# Patient Record
Sex: Female | Born: 1955
Health system: Southern US, Community
[De-identification: ages and names within clinical notes are randomized; demographics above are authoritative.]

## PROBLEM LIST (undated history)

## (undated) DIAGNOSIS — J302 Other seasonal allergic rhinitis: Secondary | ICD-10-CM

## (undated) DIAGNOSIS — K219 Gastro-esophageal reflux disease without esophagitis: Secondary | ICD-10-CM

## (undated) DIAGNOSIS — T7840XA Allergy, unspecified, initial encounter: Secondary | ICD-10-CM

## (undated) DIAGNOSIS — M722 Plantar fascial fibromatosis: Secondary | ICD-10-CM

## (undated) HISTORY — DX: Allergy, unspecified, initial encounter: T78.40XA

## (undated) HISTORY — DX: Other seasonal allergic rhinitis: J30.2

## (undated) HISTORY — DX: Gastro-esophageal reflux disease without esophagitis: K21.9

## (undated) HISTORY — DX: Plantar fascial fibromatosis: M72.2

## (undated) MED FILL — Diphenhydramine HCl Inj 50 MG/ML: INTRAMUSCULAR | Qty: 0.25 | Status: AC

---

## 1990-04-09 HISTORY — PX: TUBAL LIGATION: SHX77

## 1999-07-06 ENCOUNTER — Other Ambulatory Visit: Admission: RE | Admit: 1999-07-06 | Discharge: 1999-07-06 | Payer: Self-pay | Admitting: *Deleted

## 2002-11-04 ENCOUNTER — Other Ambulatory Visit: Admission: RE | Admit: 2002-11-04 | Discharge: 2002-11-04 | Payer: Self-pay | Admitting: *Deleted

## 2007-02-27 ENCOUNTER — Other Ambulatory Visit: Admission: RE | Admit: 2007-02-27 | Discharge: 2007-02-27 | Payer: Self-pay | Admitting: Obstetrics and Gynecology

## 2009-04-09 HISTORY — PX: COLONOSCOPY: SHX174

## 2009-04-09 LAB — HM COLONOSCOPY

## 2009-07-26 ENCOUNTER — Other Ambulatory Visit: Admission: RE | Admit: 2009-07-26 | Discharge: 2009-07-26 | Payer: Self-pay | Admitting: Obstetrics and Gynecology

## 2010-10-03 ENCOUNTER — Other Ambulatory Visit: Payer: Self-pay | Admitting: Obstetrics and Gynecology

## 2012-05-10 LAB — HM MAMMOGRAPHY

## 2012-05-10 LAB — HM PAP SMEAR

## 2013-08-20 ENCOUNTER — Telehealth: Payer: Self-pay

## 2013-08-20 NOTE — Telephone Encounter (Signed)
Medication and allergies:  Reviewed and updated  90 day supply/mail order: n/a Local pharmacy:  [id number] - [place], [place] [address].   Immunizations due:  See below   A/P: Personal, family history and past surgical hx: Reviewed and Updated PAP- 05/2012- normal per patient CCS- 2011- benign polyps; repeat in 5 years (2016) MMG- 05/2012- normal per patient Flu- 01/2013 Tdap- unsure of last vaccine   To Discuss with Provider: Nothing at this time.

## 2013-08-21 ENCOUNTER — Encounter: Payer: Self-pay | Admitting: General Practice

## 2013-08-21 ENCOUNTER — Ambulatory Visit (INDEPENDENT_AMBULATORY_CARE_PROVIDER_SITE_OTHER): Payer: BC Managed Care – PPO | Admitting: Family Medicine

## 2013-08-21 ENCOUNTER — Encounter: Payer: Self-pay | Admitting: Family Medicine

## 2013-08-21 VITALS — BP 107/76 | HR 89 | Temp 98.4°F | Resp 16 | Ht 63.0 in | Wt 162.4 lb

## 2013-08-21 DIAGNOSIS — Z0000 Encounter for general adult medical examination without abnormal findings: Secondary | ICD-10-CM

## 2013-08-21 LAB — CBC WITH DIFFERENTIAL/PLATELET
Basophils Absolute: 0.1 10*3/uL (ref 0.0–0.1)
Basophils Relative: 0.7 % (ref 0.0–3.0)
EOS PCT: 2.2 % (ref 0.0–5.0)
Eosinophils Absolute: 0.2 10*3/uL (ref 0.0–0.7)
HEMATOCRIT: 41.9 % (ref 36.0–46.0)
HEMOGLOBIN: 14.3 g/dL (ref 12.0–15.0)
LYMPHS ABS: 2.3 10*3/uL (ref 0.7–4.0)
Lymphocytes Relative: 27.4 % (ref 12.0–46.0)
MCHC: 34.2 g/dL (ref 30.0–36.0)
MCV: 90.2 fl (ref 78.0–100.0)
MONOS PCT: 7.5 % (ref 3.0–12.0)
Monocytes Absolute: 0.6 10*3/uL (ref 0.1–1.0)
NEUTROS ABS: 5.1 10*3/uL (ref 1.4–7.7)
Neutrophils Relative %: 62.2 % (ref 43.0–77.0)
Platelets: 341 10*3/uL (ref 150.0–400.0)
RBC: 4.65 Mil/uL (ref 3.87–5.11)
RDW: 13 % (ref 11.5–15.5)
WBC: 8.3 10*3/uL (ref 4.0–10.5)

## 2013-08-21 LAB — LIPID PANEL
CHOLESTEROL: 187 mg/dL (ref 0–200)
HDL: 63 mg/dL (ref >39.00)
LDL CALC: 100 mg/dL — AB (ref 0–99)
Total CHOL/HDL Ratio: 3
Triglycerides: 118 mg/dL (ref 0.0–149.0)
VLDL: 23.6 mg/dL (ref 0.0–40.0)

## 2013-08-21 LAB — HEPATIC FUNCTION PANEL
ALBUMIN: 4 g/dL (ref 3.5–5.2)
ALK PHOS: 65 U/L (ref 39–117)
ALT: 26 U/L (ref 0–35)
AST: 13 U/L (ref 0–37)
Bilirubin, Direct: 0 mg/dL (ref 0.0–0.3)
Total Bilirubin: 0.6 mg/dL (ref 0.2–1.2)
Total Protein: 7 g/dL (ref 6.0–8.3)

## 2013-08-21 LAB — BASIC METABOLIC PANEL
BUN: 27 mg/dL — AB (ref 6–23)
CO2: 30 mEq/L (ref 19–32)
CREATININE: 1.2 mg/dL (ref 0.4–1.2)
Calcium: 9.7 mg/dL (ref 8.4–10.5)
Chloride: 102 mEq/L (ref 96–112)
GFR: 48.5 mL/min — ABNORMAL LOW (ref >60.00)
Glucose, Bld: 92 mg/dL (ref 70–99)
POTASSIUM: 3.5 meq/L (ref 3.5–5.1)
Sodium: 139 mEq/L (ref 135–145)

## 2013-08-21 LAB — TSH: TSH: 1.25 u[IU]/mL (ref 0.35–4.50)

## 2013-08-21 MED ORDER — FLUTICASONE PROPIONATE 50 MCG/ACT NA SUSP: 2.0000 | Freq: Every day | NASAL | Status: DC

## 2013-08-21 NOTE — Patient Instructions (Signed)
Follow up in 1 year or as needed We'll notify you of your lab results and make any changes if needed Call with any questions or concerns Keep up the good work!  You look great! Welcome!  We're glad to have you!!

## 2013-08-21 NOTE — Assessment & Plan Note (Signed)
Pt's PE WNL.  UTD on GYN and colonoscopy.  Check labs.  Anticipatory guidance provided.  

## 2013-08-21 NOTE — Progress Notes (Signed)
Pre visit review using our clinic review tool, if applicable. No additional management support is needed unless otherwise documented below in the visit note. 

## 2013-08-21 NOTE — Progress Notes (Signed)
   Subjective:    Patient ID: Nichole Dominguez, female    DOB: [birth year], 57 y.o.   MRN: [id number]  HPI New to establish.  Previous PCP- none.  GYN- Dr Ouida Sills.  UTD on pap, mammo, colonoscopy.  Last DEXA 4-5 yrs ago.   Review of Systems Patient reports no vision/ hearing changes, adenopathy,fever, weight change,  persistant/recurrent hoarseness , swallowing issues, chest pain, palpitations, edema, persistant/recurrent cough, hemoptysis, dyspnea (rest/exertional/paroxysmal nocturnal), gastrointestinal bleeding (melena, rectal bleeding), abdominal pain, significant heartburn, bowel changes, GU symptoms (dysuria, hematuria, incontinence), Gyn symptoms (abnormal  bleeding, pain),  syncope, focal weakness, memory loss, numbness & tingling, skin/hair/nail changes, abnormal bruising or bleeding, anxiety, or depression.     Objective:   Physical Exam General Appearance:    Alert, cooperative, no distress, appears stated age  Head:    Normocephalic, without obvious abnormality, atraumatic  Eyes:    PERRL, conjunctiva/corneas clear, EOM's intact, fundi    benign, both eyes  Ears:    Normal TM's and external ear canals, both ears  Nose:   Nares normal, septum midline, mucosa normal, no drainage    or sinus tenderness  Throat:   Lips, mucosa, and tongue normal; teeth and gums normal  Neck:   Supple, symmetrical, trachea midline, no adenopathy;    Thyroid: no enlargement/tenderness/nodules  Back:     Symmetric, no curvature, ROM normal, no CVA tenderness  Lungs:     Clear to auscultation bilaterally, respirations unlabored  Chest Wall:    No tenderness or deformity   Heart:    Regular rate and rhythm, S1 and S2 normal, no murmur, rub   or gallop  Breast Exam:    Deferred to GYN  Abdomen:     Soft, non-tender, bowel sounds active all four quadrants,    no masses, no organomegaly  Genitalia:    Deferred to GYN  Rectal:    Extremities:   Extremities normal, atraumatic, no cyanosis or edema    Pulses:   2+ and symmetric all extremities  Skin:   Skin color, texture, turgor normal, no rashes or lesions  Lymph nodes:   Cervical, supraclavicular, and axillary nodes normal  Neurologic:   CNII-XII intact, normal strength, sensation and reflexes    throughout         Assessment & Plan:

## 2013-08-25 LAB — VITAMIN D 1,25 DIHYDROXY
Vitamin D 1, 25 (OH)2 Total: 24 pg/mL (ref 18–72)
Vitamin D2 1, 25 (OH)2: <8 pg/mL
Vitamin D3 1, 25 (OH)2: 24 pg/mL

## 2013-08-26 ENCOUNTER — Encounter: Payer: Self-pay | Admitting: General Practice

## 2014-06-22 ENCOUNTER — Other Ambulatory Visit: Payer: Self-pay | Admitting: General Practice

## 2014-06-22 MED ORDER — FLUTICASONE PROPIONATE 50 MCG/ACT NA SUSP: 2.0000 | Freq: Every day | NASAL | Status: DC

## 2014-09-16 ENCOUNTER — Telehealth: Payer: Self-pay | Admitting: Family Medicine

## 2014-09-16 NOTE — Telephone Encounter (Signed)
Pre Visit letter sent  °

## 2014-10-05 ENCOUNTER — Encounter: Payer: Self-pay | Admitting: Behavioral Health

## 2014-10-05 ENCOUNTER — Telehealth: Payer: Self-pay | Admitting: Behavioral Health

## 2014-10-05 NOTE — Telephone Encounter (Signed)
Pre-Visit Call completed with patient and chart updated.   Pre-Visit Info documented in Specialty Comments under [employer].    

## 2014-10-06 ENCOUNTER — Ambulatory Visit (INDEPENDENT_AMBULATORY_CARE_PROVIDER_SITE_OTHER): Payer: BLUE CROSS/BLUE SHIELD | Admitting: Family Medicine

## 2014-10-06 ENCOUNTER — Encounter: Payer: Self-pay | Admitting: Family Medicine

## 2014-10-06 ENCOUNTER — Other Ambulatory Visit (HOSPITAL_COMMUNITY)
Admission: RE | Admit: 2014-10-06 | Discharge: 2014-10-06 | Disposition: A | Discharge status: Home / Self Care | Payer: BLUE CROSS/BLUE SHIELD | Source: Ambulatory Visit | Attending: Family Medicine | Admitting: Family Medicine

## 2014-10-06 VITALS — BP 110/78 | HR 101 | Temp 98.0°F | Resp 16 | Ht 63.0 in | Wt 148.2 lb

## 2014-10-06 DIAGNOSIS — Z1151 Encounter for screening for human papillomavirus (HPV): Secondary | ICD-10-CM | POA: Diagnosis present

## 2014-10-06 DIAGNOSIS — R7989 Other specified abnormal findings of blood chemistry: Secondary | ICD-10-CM | POA: Diagnosis not present

## 2014-10-06 DIAGNOSIS — Z0000 Encounter for general adult medical examination without abnormal findings: Secondary | ICD-10-CM

## 2014-10-06 DIAGNOSIS — Z01419 Encounter for gynecological examination (general) (routine) without abnormal findings: Secondary | ICD-10-CM | POA: Diagnosis present

## 2014-10-06 DIAGNOSIS — Z124 Encounter for screening for malignant neoplasm of cervix: Secondary | ICD-10-CM

## 2014-10-06 DIAGNOSIS — Z780 Asymptomatic menopausal state: Secondary | ICD-10-CM

## 2014-10-06 DIAGNOSIS — Z1231 Encounter for screening mammogram for malignant neoplasm of breast: Secondary | ICD-10-CM | POA: Diagnosis not present

## 2014-10-06 DIAGNOSIS — Z23 Encounter for immunization: Secondary | ICD-10-CM | POA: Diagnosis not present

## 2014-10-06 DIAGNOSIS — E041 Nontoxic single thyroid nodule: Secondary | ICD-10-CM

## 2014-10-06 NOTE — Progress Notes (Signed)
Pre visit review using our clinic review tool, if applicable. No additional management support is needed unless otherwise documented below in the visit note. 

## 2014-10-06 NOTE — Patient Instructions (Signed)
Follow up in 1 year or as needed We'll notify you of your lab results and make any changes if needed We'll call you with your mammo and bone density appts We'll check on the insurance coverage for the ultrasound Keep up the good work on healthy diet and regular exercise Call with any questions or concerns Happy 4th of July!!!

## 2014-10-06 NOTE — Progress Notes (Signed)
   Subjective:    Patient ID: Nichole Dominguez, female    DOB: [birth year], 59 y.o.   MRN: [id number]  HPI CPE- due for mammo, DEXA.  UTD on pap but pt would like to have this done today.  UTD on colonoscopy w/ [facility]   Review of Systems Patient reports no vision/ hearing changes, adenopathy,fever, weight change,  persistant/recurrent hoarseness , swallowing issues, chest pain, palpitations, edema, persistant/recurrent cough, hemoptysis, dyspnea (rest/exertional/paroxysmal nocturnal), gastrointestinal bleeding (melena, rectal bleeding), abdominal pain, significant heartburn, bowel changes, GU symptoms (dysuria, hematuria, incontinence), Gyn symptoms (abnormal  bleeding, pain),  syncope, focal weakness, memory loss, numbness & tingling, skin/hair/nail changes, abnormal bruising or bleeding, anxiety, or depression.     Objective:   Physical Exam  General Appearance:    Alert, cooperative, no distress, appears stated age  Head:    Normocephalic, without obvious abnormality, atraumatic  Eyes:    PERRL, conjunctiva/corneas clear, EOM's intact, fundi    benign, both eyes  Ears:    Normal TM's and external ear canals, both ears  Nose:   Nares normal, septum midline, mucosa normal, no drainage    or sinus tenderness  Throat:   Lips, mucosa, and tongue normal; teeth and gums normal  Neck:   Supple, symmetrical, trachea midline, no adenopathy;    Thyroid: questionable small L thyroid nodule laterally  Back:     Symmetric, no curvature, ROM normal, no CVA tenderness  Lungs:     Clear to auscultation bilaterally, respirations unlabored  Chest Wall:    No tenderness or deformity   Heart:    Regular rate and rhythm, S1 and S2 normal, no murmur, rub   or gallop  Breast Exam:    No tenderness, masses, or nipple abnormality  Abdomen:     Soft, non-tender, bowel sounds active all four quadrants,    no masses, no organomegaly  Genitalia:    External genitalia normal, cervix normal in appearance, no CMT,  uterus in normal size and position, adnexa w/out mass or tenderness, mucosa pink and moist, no lesions or discharge present  Rectal:    Normal external appearance  Extremities:   Extremities normal, atraumatic, no cyanosis or edema  Pulses:   2+ and symmetric all extremities  Skin:   Skin color, texture, turgor normal, no rashes or lesions  Lymph nodes:   Cervical, supraclavicular, and axillary nodes normal  Neurologic:   CNII-XII intact, normal strength, sensation and reflexes    throughout          Assessment & Plan:

## 2014-10-06 NOTE — Assessment & Plan Note (Signed)
I questions whether I feel a small L lateral thyroid nodule on PE today.  Father has had thyroid cancer x2.  Plan is for [place] but pt is concerned that insurance will not pay for diagnostic imaging.  Will order [place] and ask radiology to pre-cert w/ insurance so pt will know out of pocket expenses up front.  Pt expressed understanding and is in agreement w/ plan.

## 2014-10-06 NOTE — Assessment & Plan Note (Signed)
Pap collected. 

## 2014-10-06 NOTE — Assessment & Plan Note (Addendum)
Pt's PE WNL w/ exception of possible small thyroid nodule on L.  Order for mammo and DEXA entered as part of routine well woman preventative care.  UTD on colonoscopy.  Check labs.  Anticipatory guidance provided.

## 2014-10-07 ENCOUNTER — Other Ambulatory Visit: Payer: Self-pay | Admitting: Family Medicine

## 2014-10-07 DIAGNOSIS — Z23 Encounter for immunization: Secondary | ICD-10-CM | POA: Diagnosis not present

## 2014-10-07 DIAGNOSIS — E781 Pure hyperglyceridemia: Secondary | ICD-10-CM

## 2014-10-07 LAB — CBC WITH DIFFERENTIAL/PLATELET
BASOS PCT: 1.1 % (ref 0.0–3.0)
Basophils Absolute: 0.1 10*3/uL (ref 0.0–0.1)
EOS ABS: 0.1 10*3/uL (ref 0.0–0.7)
Eosinophils Relative: 1.3 % (ref 0.0–5.0)
HEMATOCRIT: 43.5 % (ref 36.0–46.0)
Hemoglobin: 14.9 g/dL (ref 12.0–15.0)
Lymphocytes Relative: 24.1 % (ref 12.0–46.0)
Lymphs Abs: 1.9 10*3/uL (ref 0.7–4.0)
MCHC: 34.2 g/dL (ref 30.0–36.0)
MCV: 88.5 fl (ref 78.0–100.0)
MONO ABS: 0.5 10*3/uL (ref 0.1–1.0)
Monocytes Relative: 6.2 % (ref 3.0–12.0)
NEUTROS PCT: 67.3 % (ref 43.0–77.0)
Neutro Abs: 5.2 10*3/uL (ref 1.4–7.7)
Platelets: 343 10*3/uL (ref 150.0–400.0)
RBC: 4.91 Mil/uL (ref 3.87–5.11)
RDW: 12.4 % (ref 11.5–15.5)
WBC: 7.7 10*3/uL (ref 4.0–10.5)

## 2014-10-07 LAB — BASIC METABOLIC PANEL
BUN: 28 mg/dL — ABNORMAL HIGH (ref 6–23)
CALCIUM: 9.4 mg/dL (ref 8.4–10.5)
CHLORIDE: 102 meq/L (ref 96–112)
CO2: 30 mEq/L (ref 19–32)
Creatinine, Ser: 1.1 mg/dL (ref 0.40–1.20)
GFR: 54.43 mL/min — ABNORMAL LOW (ref >60.00)
Glucose, Bld: 67 mg/dL — ABNORMAL LOW (ref 70–99)
Potassium: 3.6 mEq/L (ref 3.5–5.1)
SODIUM: 140 meq/L (ref 135–145)

## 2014-10-07 LAB — HEPATIC FUNCTION PANEL
ALT: 18 U/L (ref 0–35)
AST: 15 U/L (ref 0–37)
Albumin: 4.2 g/dL (ref 3.5–5.2)
Alkaline Phosphatase: 91 U/L (ref 39–117)
Bilirubin, Direct: 0.1 mg/dL (ref 0.0–0.3)
Total Bilirubin: 0.3 mg/dL (ref 0.2–1.2)
Total Protein: 7.2 g/dL (ref 6.0–8.3)

## 2014-10-07 LAB — LIPID PANEL
Cholesterol: 205 mg/dL — ABNORMAL HIGH (ref 0–200)
HDL: 54.8 mg/dL
NonHDL: 150.2
Total CHOL/HDL Ratio: 4
Triglycerides: 365 mg/dL — ABNORMAL HIGH (ref 0.0–149.0)
VLDL: 73 mg/dL — ABNORMAL HIGH (ref 0.0–40.0)

## 2014-10-07 LAB — TSH: TSH: 1.54 u[IU]/mL (ref 0.35–4.50)

## 2014-10-07 LAB — VITAMIN D 25 HYDROXY (VIT D DEFICIENCY, FRACTURES): VITD: 46.85 ng/mL (ref 30.00–100.00)

## 2014-10-07 LAB — LDL CHOLESTEROL, DIRECT: Direct LDL: 111 mg/dL

## 2014-10-07 NOTE — Addendum Note (Signed)
Addended by: Kris Hartmann on: 10/07/2014 08:38 AM   Modules accepted: Orders

## 2014-10-08 LAB — CYTOLOGY - PAP

## 2014-10-26 ENCOUNTER — Ambulatory Visit (HOSPITAL_BASED_OUTPATIENT_CLINIC_OR_DEPARTMENT_OTHER)
Admission: RE | Admit: 2014-10-26 | Discharge: 2014-10-26 | Disposition: A | Discharge status: Home / Self Care | Payer: BLUE CROSS/BLUE SHIELD | Source: Ambulatory Visit | Attending: Family Medicine | Admitting: Family Medicine

## 2014-10-26 DIAGNOSIS — R928 Other abnormal and inconclusive findings on diagnostic imaging of breast: Secondary | ICD-10-CM | POA: Insufficient documentation

## 2014-10-26 DIAGNOSIS — Z1231 Encounter for screening mammogram for malignant neoplasm of breast: Secondary | ICD-10-CM | POA: Insufficient documentation

## 2014-10-29 ENCOUNTER — Other Ambulatory Visit: Payer: Self-pay | Admitting: Family Medicine

## 2014-10-29 DIAGNOSIS — R928 Other abnormal and inconclusive findings on diagnostic imaging of breast: Secondary | ICD-10-CM

## 2014-11-02 ENCOUNTER — Ambulatory Visit (HOSPITAL_BASED_OUTPATIENT_CLINIC_OR_DEPARTMENT_OTHER): Payer: BLUE CROSS/BLUE SHIELD

## 2014-11-04 ENCOUNTER — Other Ambulatory Visit: Payer: BLUE CROSS/BLUE SHIELD

## 2014-11-16 ENCOUNTER — Telehealth: Payer: Self-pay | Admitting: Family Medicine

## 2014-11-16 ENCOUNTER — Ambulatory Visit (HOSPITAL_BASED_OUTPATIENT_CLINIC_OR_DEPARTMENT_OTHER)
Admission: RE | Admit: 2014-11-16 | Discharge: 2014-11-16 | Disposition: A | Discharge status: Home / Self Care | Payer: BLUE CROSS/BLUE SHIELD | Source: Ambulatory Visit | Attending: Family Medicine | Admitting: Family Medicine

## 2014-11-16 ENCOUNTER — Encounter: Payer: Self-pay | Admitting: General Practice

## 2014-11-16 DIAGNOSIS — M85851 Other specified disorders of bone density and structure, right thigh: Secondary | ICD-10-CM | POA: Diagnosis not present

## 2014-11-16 DIAGNOSIS — Z780 Asymptomatic menopausal state: Secondary | ICD-10-CM | POA: Insufficient documentation

## 2014-11-16 DIAGNOSIS — E042 Nontoxic multinodular goiter: Secondary | ICD-10-CM | POA: Insufficient documentation

## 2014-11-16 DIAGNOSIS — E041 Nontoxic single thyroid nodule: Secondary | ICD-10-CM

## 2014-11-16 DIAGNOSIS — M8588 Other specified disorders of bone density and structure, other site: Secondary | ICD-10-CM | POA: Diagnosis not present

## 2014-11-16 NOTE — Telephone Encounter (Signed)
We can try postmenopausal estrogen deficiency

## 2014-11-16 NOTE — Telephone Encounter (Signed)
Can we work on recoding this with the new diagnosis?

## 2014-11-16 NOTE — Telephone Encounter (Signed)
Caller name: Relation to JY:LTEI Call back number:[phone] Pharmacy:  Reason for call: pt states dr. Darene Lamer order a vitamin D test and her insurance did not pay because it states it was a routine test, pt wants to know how dr. t coded it and if there is a way to change the code so that her insurance will pay for it.

## 2014-11-18 ENCOUNTER — Ambulatory Visit
Admission: RE | Admit: 2014-11-18 | Discharge: 2014-11-18 | Disposition: A | Discharge status: Home / Self Care | Payer: BLUE CROSS/BLUE SHIELD | Source: Ambulatory Visit | Attending: Family Medicine | Admitting: Family Medicine

## 2014-11-18 DIAGNOSIS — R928 Other abnormal and inconclusive findings on diagnostic imaging of breast: Secondary | ICD-10-CM

## 2015-01-18 ENCOUNTER — Telehealth: Payer: Self-pay | Admitting: Family Medicine

## 2015-01-18 NOTE — Telephone Encounter (Signed)
Caller name: Nichole Dominguez  Relationship to patient: Self   Can be reached: [phone]  Reason for call: Pt called in because she received her bone density bill. She says that her insurance is stating that they will not pay for it because it was coded incorrectly.   Please advise.      Thanks.

## 2015-02-04 ENCOUNTER — Telehealth: Payer: Self-pay | Admitting: Family Medicine

## 2015-02-04 NOTE — Telephone Encounter (Signed)
Caller name: Daine   Relationship to patient: Self   Can be reached: [phone]  Reason for call: Pt called in stating that her visit on date of service 2014/12/02 (bone density) was coded incorrectly, per her insurance.  She says that her insurance will not cover the code that was sent.    Please advise.

## 2015-02-04 NOTE — Telephone Encounter (Signed)
Will send to office manager for review.  We can likely re-code this w/ their preferred diagnosis and resubmit

## 2015-02-10 NOTE — Telephone Encounter (Signed)
Email sent to Carolann Littler with [facility] billing to see why insurance is denying the claim. Will reach out to patient

## 2015-02-11 NOTE — Telephone Encounter (Signed)
Per Larene Beach they are following up with patients insurance for denial reason, she will follow up with me once she can speak with a rep

## 2015-03-10 NOTE — Telephone Encounter (Signed)
error 

## 2015-03-15 ENCOUNTER — Telehealth: Payer: Self-pay

## 2015-03-15 NOTE — Telephone Encounter (Signed)
err

## 2015-03-16 ENCOUNTER — Other Ambulatory Visit: Payer: Self-pay | Admitting: Family Medicine

## 2015-03-16 DIAGNOSIS — M8580 Other specified disorders of bone density and structure, unspecified site: Secondary | ICD-10-CM

## 2015-03-17 ENCOUNTER — Ambulatory Visit: Payer: BLUE CROSS/BLUE SHIELD | Admitting: Family Medicine

## 2015-03-21 NOTE — Telephone Encounter (Signed)
After talking to [facility] [employer] these charges will stick according to them the coding it correct

## 2015-05-05 ENCOUNTER — Telehealth: Payer: Self-pay

## 2015-05-05 MED ORDER — FLUTICASONE PROPIONATE 50 MCG/ACT NA SUSP: 2.0000 | Freq: Every day | NASAL | Status: DC

## 2015-05-16 NOTE — Telephone Encounter (Signed)
Pt needing flonase sent to [facility]. They stated they have tried to contact us multiple times. Please send in for her asap. Pt states that she is almost out.

## 2015-05-17 ENCOUNTER — Other Ambulatory Visit: Payer: Self-pay

## 2015-05-17 MED ORDER — FLUTICASONE PROPIONATE 50 MCG/ACT NA SUSP: 2.0000 | Freq: Every day | NASAL | Status: DC

## 2015-05-17 NOTE — Telephone Encounter (Addendum)
Flonase Rx sent to [facility] home delivery per patient request.

## 2015-06-23 ENCOUNTER — Telehealth: Payer: Self-pay

## 2015-06-23 NOTE — Telephone Encounter (Signed)
Patient called back checking the status of the Addendum, provider did in February. I called billing on conference call with the patient and was given the fax number [phone] sent to billing to refill received confirmation on fax.

## 2015-07-10 ENCOUNTER — Encounter: Payer: Self-pay | Admitting: Family

## 2015-07-11 MED ORDER — FLUTICASONE PROPIONATE 50 MCG/ACT NA SUSP
2.0000 | Freq: Every day | NASAL | Status: DC
Start: 2015-07-11 — End: 2016-08-14

## 2015-10-07 ENCOUNTER — Encounter: Payer: BLUE CROSS/BLUE SHIELD | Admitting: Family Medicine

## 2015-10-24 ENCOUNTER — Ambulatory Visit (INDEPENDENT_AMBULATORY_CARE_PROVIDER_SITE_OTHER): Payer: BLUE CROSS/BLUE SHIELD | Admitting: Family

## 2015-10-24 ENCOUNTER — Encounter: Payer: Self-pay | Admitting: Family

## 2015-10-24 VITALS — BP 110/80 | HR 74 | Temp 97.6°F | Resp 18 | Ht 61.75 in | Wt 156.4 lb

## 2015-10-24 DIAGNOSIS — Z0000 Encounter for general adult medical examination without abnormal findings: Secondary | ICD-10-CM | POA: Diagnosis not present

## 2015-10-24 NOTE — Progress Notes (Signed)
Pre visit review using our clinic review tool, if applicable. No additional management support is needed unless otherwise documented below in the visit note. 

## 2015-10-24 NOTE — Patient Instructions (Signed)
Try to increase exercise- 30 minutes 5 days a week. Follow up in 1 year. Sooner if problems/concerns.

## 2015-10-24 NOTE — Progress Notes (Signed)
Subjective:    Patient ID: Nichole Dominguez, female    DOB: 05-Jan-1956, 60 y.o.   MRN: [id number]  HPI  [name] is a 60 yr old female who presents today for cpx.  Patient presents today for complete physical.  Immunizations: up to date Diet: generally health Exercise: walks at work, no formal exercise.   Colonoscopy: 5/11- she was told to follow up in 5 years, declines Dexa: 11/16/14 Pap Smear: 10/06/14- negative Mammogram: 11/18/14 Dental: up to date Vision: up to date  Review of Systems  Constitutional: Negative for unexpected weight change.  HENT: Negative for rhinorrhea.   Eyes: Negative for visual disturbance.  Respiratory: Negative for cough.   Cardiovascular: Negative for leg swelling.  Gastrointestinal: Negative for diarrhea and constipation.  Musculoskeletal: Negative for arthralgias.       Reports some leg cramps at night.  Heating pad helps  Skin: Negative for rash.  Neurological: Negative for headaches.  Hematological: Negative for adenopathy.  Psychiatric/Behavioral:       Denies depression/anxiety       Past Medical History  Diagnosis Date  . Seasonal allergies   . Plantar fasciitis      Social History   Social History  . Marital Status: Married    Spouse Name: N/A  . Number of Children: N/A  . Years of Education: N/A   Occupational History  . Not on file.   Social History Main Topics  . Smoking status: Former Smoker    Quit date: 10/10/1996  . Smokeless tobacco: Not on file  . Alcohol Use: 0.0 oz/week    0 Standard drinks or equivalent per week     Comment: 6 oz of red wine per day   . Drug Use: No  . Sexual Activity:    Partners: Male   Other Topics Concern  . Not on file   Social History Narrative    Past Surgical History  Procedure Laterality Date  . Tubal ligation  1992    Family History  Problem Relation Age of Onset  . Diabetes Mother   . Heart disease Mother   . Hypertension Mother   . Cervical cancer Mother   .  Obesity Mother   . Colon cancer Mother   . Thyroid cancer Father   . Cancer Father     thyroid x2  . Breast cancer Sister   . Obesity Maternal Grandmother   . Diabetes Maternal Grandmother   . Heart disease Maternal Grandmother   . Hypertension Maternal Grandmother   . Stroke Maternal Grandmother   . Lupus Sister     Allergies  Allergen Reactions  . Darvon [Propoxyphene] Rash    Current Outpatient Prescriptions on File Prior to Visit  Medication Sig Dispense Refill  . diphenhydrAMINE (BENADRYL) 25 mg capsule Take 25 mg by mouth at bedtime as needed.     . fluticasone (FLONASE) 50 MCG/ACT nasal spray Place 2 sprays into both nostrils daily. (Patient taking differently: Place 2 sprays into both nostrils daily as needed. ) 48 g 1  . loratadine (CLARITIN) 10 MG tablet Take 10 mg by mouth daily.    . Multiple Vitamins-Minerals (MULTIVITAMIN WITH MINERALS) tablet Take 1 tablet by mouth daily.     No current facility-administered medications on file prior to visit.    BP 110/80 mmHg  Pulse 74  Temp(Src) 97.6 F (36.4 C) (Oral)  Resp 18  Ht 5' 1.75" (1.568 m)  Wt 156 lb 6.4 oz (70.943 kg)  BMI 28.85 kg/m2  SpO2 98%    Objective:   Physical Exam  Physical Exam  Constitutional: She is oriented to person, place, and time. She appears well-developed and well-nourished. No distress.  HENT:  Head: Normocephalic and atraumatic.  Right Ear: Tympanic membrane and ear canal normal.  Left Ear: Tympanic membrane and ear canal normal.  Mouth/Throat: Oropharynx is clear and moist.  Eyes: Pupils are equal, round, and reactive to light. No scleral icterus.  Neck: Normal range of motion. No thyromegaly present.  Cardiovascular: Normal rate and regular rhythm.   No murmur heard. Pulmonary/Chest: Effort normal and breath sounds normal. No respiratory distress. He has no wheezes. She has no rales. She exhibits no tenderness.  Abdominal: Soft. Bowel sounds are normal. He exhibits no  distension and no mass. There is no tenderness. There is no rebound and no guarding.  Musculoskeletal: She exhibits no edema.  Lymphadenopathy:    She has no cervical adenopathy.  Neurological: She is alert and oriented to person, place, and time. She has normal patellar reflexes. She exhibits normal muscle tone. Coordination normal.  Skin: Skin is warm and dry. (suntanned) Psychiatric: She has a normal mood and affect. Her behavior is normal. Judgment and thought content normal.  Breasts: Examined lying Right: Without masses, retractions, discharge or axillary adenopathy.  Left: Without masses, retractions, discharge or axillary adenopathy.  Pelvic:  Deferred.        Assessment & Plan:         Assessment & Plan:  Preventative care: immunizations reviewed and up to date.  Declines lab work, colo, ekg, mammogram.  Discussed diet, exercise, weight loss.

## 2015-12-28 ENCOUNTER — Ambulatory Visit (INDEPENDENT_AMBULATORY_CARE_PROVIDER_SITE_OTHER): Payer: BLUE CROSS/BLUE SHIELD

## 2015-12-28 DIAGNOSIS — Z23 Encounter for immunization: Secondary | ICD-10-CM

## 2015-12-28 NOTE — Progress Notes (Signed)
Pre visit review using our clinic review tool, if applicable. No additional management support is needed unless otherwise documented below in the visit note.  Pt in clinic today for flu vaccine.

## 2016-01-04 ENCOUNTER — Encounter: Payer: Self-pay | Admitting: Family

## 2016-01-04 ENCOUNTER — Ambulatory Visit (INDEPENDENT_AMBULATORY_CARE_PROVIDER_SITE_OTHER): Payer: BLUE CROSS/BLUE SHIELD | Admitting: Family

## 2016-01-04 VITALS — BP 129/73 | HR 93 | Temp 98.4°F | Resp 16 | Ht 61.75 in | Wt 156.6 lb

## 2016-01-04 DIAGNOSIS — J069 Acute upper respiratory infection, unspecified: Secondary | ICD-10-CM

## 2016-01-04 NOTE — Progress Notes (Signed)
Subjective:    Patient ID: Nichole Dominguez, female    DOB: Jul 06, 1955, 60 y.o.   MRN: [id number]  HPI  Nichole Dominguez is a 60 yr old female who presents today with chief complaint of cough. Cough began 2 days ago and has associated sinus pressure.  She also reports + HA and rash. Today she reports that cough has resolved and HA yesterday has since resolved.  Rash began Monday after taking sudafed. Reports previous history of rash in the past after taking sudafed. Rash is on extremities and trunk. Rash is non-pruritic.   Review of Systems    see HPI  Past Medical History:  Diagnosis Date  . Plantar fasciitis   . Seasonal allergies      Social History   Social History  . Marital status: Married    Spouse name: N/A  . Number of children: N/A  . Years of education: N/A   Occupational History  . Not on file.   Social History Main Topics  . Smoking status: Former Smoker    Quit date: 10/10/1996  . Smokeless tobacco: Not on file  . Alcohol use 0.0 oz/week     Comment: 6 oz of red wine per day   . Drug use: No  . Sexual activity: Yes    Partners: Male   Other Topics Concern  . Not on file   Social History Narrative   Works at [employer] in the [employer].    Lives with husband   2 grown children- one in [place] and one in [place], grandchildren   2 grown stepchildren- both local, grandchildren   Enjoys canning/gardening       Past Surgical History:  Procedure Laterality Date  . TUBAL LIGATION  1992    Family History  Problem Relation Age of Onset  . Diabetes Mother   . Heart disease Mother   . Hypertension Mother   . Cervical cancer Mother   . Obesity Mother   . Colon cancer Mother   . Thyroid cancer Father   . Cancer Father     thyroid x2  . Breast cancer Sister   . Obesity Maternal Grandmother   . Diabetes Maternal Grandmother   . Heart disease Maternal Grandmother   . Hypertension Maternal Grandmother   . Stroke Maternal Grandmother   . Lupus  Sister     Allergies  Allergen Reactions  . Darvon [Propoxyphene] Rash    Current Outpatient Prescriptions on File Prior to Visit  Medication Sig Dispense Refill  . diphenhydrAMINE (BENADRYL) 25 mg capsule Take 25 mg by mouth at bedtime as needed.     . fluticasone (FLONASE) 50 MCG/ACT nasal spray Place 2 sprays into both nostrils daily. (Patient taking differently: Place 2 sprays into both nostrils daily as needed. ) 48 g 1  . loratadine (CLARITIN) 10 MG tablet Take 10 mg by mouth daily.    . Multiple Vitamins-Minerals (MULTIVITAMIN WITH MINERALS) tablet Take 1 tablet by mouth daily.    . Probiotic Product (PROBIOTIC ADVANCED PO) Take 1 capsule by mouth daily.     No current facility-administered medications on file prior to visit.     BP 129/73 (BP Location: Right Arm, Cuff Size: Normal)   Pulse 93   Temp 98.4 F (36.9 C) (Oral)   Resp 16   Ht 5' 1.75" (1.568 m)   Wt 156 lb 9.6 oz (71 kg)   SpO2 98% Comment: room air  BMI 28.87 kg/m    Objective:  Physical Exam  Constitutional: She is oriented to person, place, and time. She appears well-developed and well-nourished.  HENT:  Head: Normocephalic and atraumatic.  Right Ear: Tympanic membrane and ear canal normal.  Left Ear: Tympanic membrane and ear canal normal.  Mouth/Throat: No oropharyngeal exudate, posterior oropharyngeal edema or posterior oropharyngeal erythema.  Cardiovascular: Normal rate, regular rhythm and normal heart sounds.   No murmur heard. Pulmonary/Chest: Effort normal and breath sounds normal. No respiratory distress. She has no wheezes.  Musculoskeletal: She exhibits no edema.  Neurological: She is alert and oriented to person, place, and time.  Psychiatric: She has a normal mood and affect. Her behavior is normal. Judgment and thought content normal.          Assessment & Plan:  Viral URI- Symptoms most consistent with viral URI.  Rash may be secondary to sudafed allergy or viral illness.  Advised pt to avoid sudafed. Advised patient as follows. You may use claritin or nasal saline spray as needed for nasal congestion. For cough you may use delsym. Call if new/worsening symptoms, fever >101 or if you are not improved in 3 days.

## 2016-01-04 NOTE — Progress Notes (Signed)
Pre visit review using our clinic review tool, if applicable. No additional management support is needed unless otherwise documented below in the visit note. 

## 2016-01-04 NOTE — Patient Instructions (Signed)
You may use claritin or nasal saline spray as needed for nasal congestion. For cough you may use delsym. Call if new/worsening symptoms, fever >101 or if you are not improved in 3 days.

## 2016-08-14 ENCOUNTER — Other Ambulatory Visit: Payer: Self-pay | Admitting: Family Medicine

## 2016-08-15 ENCOUNTER — Telehealth: Payer: Self-pay | Admitting: Family

## 2016-08-15 NOTE — Telephone Encounter (Signed)
complete

## 2016-10-29 ENCOUNTER — Encounter: Payer: BLUE CROSS/BLUE SHIELD | Admitting: Family

## 2016-11-05 ENCOUNTER — Encounter: Payer: BLUE CROSS/BLUE SHIELD | Admitting: Family

## 2016-11-19 ENCOUNTER — Ambulatory Visit (INDEPENDENT_AMBULATORY_CARE_PROVIDER_SITE_OTHER): Payer: BLUE CROSS/BLUE SHIELD | Admitting: Family

## 2016-11-19 ENCOUNTER — Encounter: Payer: Self-pay | Admitting: Family

## 2016-11-19 VITALS — BP 130/78 | HR 82 | Temp 98.3°F | Resp 18 | Ht 62.0 in | Wt 162.6 lb

## 2016-11-19 DIAGNOSIS — Z0000 Encounter for general adult medical examination without abnormal findings: Secondary | ICD-10-CM

## 2016-11-19 NOTE — Patient Instructions (Addendum)
Please complete lab work prior to leaving. Continue to work on healthy diet, exercise and weight loss.  

## 2016-11-19 NOTE — Progress Notes (Signed)
Subjective:    Patient ID: Nichole Dominguez, female    DOB: [birth year], 61 y.o.   MRN: [id number]  HPI  Patient presents today for complete physical.  Immunizations: tetanus up to date.  Diet:  healthy Exercise:  Needs more exercise Wt Readings from Last 3 Encounters:  11/19/16 162 lb 9.6 oz (73.8 kg)  01/04/16 156 lb 9.6 oz (71 kg)  10/24/15 156 lb 6.4 oz (70.9 kg)  Colonoscopy:  2011, was told 5 year follow up. Declines until 2021.  Dexa: 8/16 Pap Smear: 6/16- normal Mammogram: 8/16- due pt will schedule.      Review of Systems  Constitutional: Negative for unexpected weight change.  HENT: Negative for hearing loss and rhinorrhea.   Respiratory: Negative for cough.   Cardiovascular: Negative for leg swelling.  Gastrointestinal: Negative for blood in stool, constipation and diarrhea.  Genitourinary: Negative for hematuria.  Musculoskeletal: Negative for arthralgias and myalgias.  Skin: Negative for rash.  Neurological: Negative for headaches.  Hematological: Negative for adenopathy.  Psychiatric/Behavioral:       Denies depression/anxiety       Past Medical History:  Diagnosis Date  . Plantar fasciitis   . Seasonal allergies      Social History   Social History  . Marital status: Married    Spouse name: N/A  . Number of children: N/A  . Years of education: N/A   Occupational History  . Not on file.   Social History Main Topics  . Smoking status: Former Smoker    Quit date: 10/10/1996  . Smokeless tobacco: Never Used  . Alcohol use 0.0 oz/week     Comment: 6 oz of red wine per day   . Drug use: No  . Sexual activity: Yes    Partners: Male   Other Topics Concern  . Not on file   Social History Narrative   Works at [employer] in the [employer].    Lives with husband   2 grown children- one in [place] and one in [place], grandchildren   2 grown stepchildren- both local, grandchildren   Enjoys canning/gardening       Past Surgical History:    Procedure Laterality Date  . TUBAL LIGATION  1992    Family History  Problem Relation Age of Onset  . Diabetes Mother   . Heart disease Mother   . Hypertension Mother   . Cervical cancer Mother   . Obesity Mother   . Colon cancer Mother   . Thyroid cancer Father   . Cancer Father        thyroid x2  . Breast cancer Sister   . Obesity Maternal Grandmother   . Diabetes Maternal Grandmother   . Heart disease Maternal Grandmother   . Hypertension Maternal Grandmother   . Stroke Maternal Grandmother   . Lupus Sister     Allergies  Allergen Reactions  . Darvon [Propoxyphene] Rash  . Sudafed [Pseudoephedrine Hcl]     rash    Current Outpatient Prescriptions on File Prior to Visit  Medication Sig Dispense Refill  . diphenhydrAMINE (BENADRYL) 25 mg capsule Take 25 mg by mouth at bedtime as needed.     . fluticasone (FLONASE) 50 MCG/ACT nasal spray USE 2 SPRAYS IN EACH NOSTRIL DAILY 48 g 1  . loratadine (CLARITIN) 10 MG tablet Take 10 mg by mouth daily.    . Multiple Vitamins-Minerals (MULTIVITAMIN WITH MINERALS) tablet Take 1 tablet by mouth daily.     No current facility-administered medications  on file prior to visit.     BP 130/78 (BP Location: Left Arm, Cuff Size: Normal)   Pulse 82   Temp 98.3 F (36.8 C) (Oral)   Resp 18   Ht 5\' 2"  (1.575 m)   Wt 162 lb 9.6 oz (73.8 kg)   SpO2 96%   BMI 29.74 kg/m    Objective:   Physical Exam Physical Exam  Constitutional: She is oriented to person, place, and time. She appears well-developed and well-nourished. No distress.  HENT:  Head: Normocephalic and atraumatic.  Right Ear: Tympanic membrane and ear canal normal.  Left Ear: Tympanic membrane and ear canal normal.  Mouth/Throat: Oropharynx is clear and moist.  Eyes: Pupils are equal, round, and reactive to light. No scleral icterus.  Neck: Normal range of motion. No thyromegaly present.  Cardiovascular: Normal rate and regular rhythm.   No murmur  heard. Pulmonary/Chest: Effort normal and breath sounds normal. No respiratory distress. He has no wheezes. She has no rales. She exhibits no tenderness.  Abdominal: Soft. Bowel sounds are normal. She exhibits no distension and no mass. There is no tenderness. There is no rebound and no guarding.  Musculoskeletal: She exhibits no edema.  Lymphadenopathy:    She has no cervical adenopathy.  Neurological: She is alert and oriented to person, place, and time. She has normal patellar reflexes. She exhibits normal muscle tone. Coordination normal.  Skin: Skin is warm and dry.  Psychiatric: She has a normal mood and affect. Her behavior is normal. Judgment and thought content normal.  Breasts: Examined lying Right: Without masses, retractions, discharge or axillary adenopathy.  Left: Without masses, retractions, discharge or axillary adenopathy.  Pelvic: deferred.           Assessment & Plan:          Assessment & Plan:  Preventative Care- discussed healthy diet, exercise, weight loss. She will schedule mammo, declines colo. Obtain routine lab work.

## 2016-11-20 LAB — BASIC METABOLIC PANEL
BUN: 25 mg/dL — ABNORMAL HIGH (ref 6–23)
CO2: 29 mEq/L (ref 19–32)
Calcium: 10.8 mg/dL — ABNORMAL HIGH (ref 8.4–10.5)
Chloride: 102 mEq/L (ref 96–112)
Creatinine, Ser: 1.12 mg/dL (ref 0.40–1.20)
GFR: 52.92 mL/min — AB (ref >60.00)
Glucose, Bld: 98 mg/dL (ref 70–99)
Potassium: 4.5 mEq/L (ref 3.5–5.1)
Sodium: 140 mEq/L (ref 135–145)

## 2016-11-20 LAB — CBC WITH DIFFERENTIAL/PLATELET
Basophils Absolute: 0.1 10*3/uL (ref 0.0–0.1)
Basophils Relative: 1.3 % (ref 0.0–3.0)
Eosinophils Absolute: 0.2 10*3/uL (ref 0.0–0.7)
Eosinophils Relative: 2 % (ref 0.0–5.0)
HCT: 44.9 % (ref 36.0–46.0)
HEMOGLOBIN: 14.9 g/dL (ref 12.0–15.0)
Lymphocytes Relative: 31.4 % (ref 12.0–46.0)
Lymphs Abs: 2.6 10*3/uL (ref 0.7–4.0)
MCHC: 33.2 g/dL (ref 30.0–36.0)
MCV: 90 fl (ref 78.0–100.0)
Monocytes Absolute: 0.7 10*3/uL (ref 0.1–1.0)
Monocytes Relative: 8.5 % (ref 3.0–12.0)
NEUTROS PCT: 56.8 % (ref 43.0–77.0)
Neutro Abs: 4.6 10*3/uL (ref 1.4–7.7)
Platelets: 340 10*3/uL (ref 150.0–400.0)
RBC: 4.98 Mil/uL (ref 3.87–5.11)
RDW: 13.3 % (ref 11.5–15.5)
WBC: 8.1 10*3/uL (ref 4.0–10.5)

## 2016-11-20 LAB — TSH: TSH: 3.58 u[IU]/mL (ref 0.35–4.50)

## 2016-11-20 LAB — URINALYSIS, ROUTINE W REFLEX MICROSCOPIC
Bilirubin Urine: NEGATIVE
HGB URINE DIPSTICK: NEGATIVE
KETONES UR: NEGATIVE
Leukocytes, UA: NEGATIVE
Nitrite: NEGATIVE
PH: 7 (ref 5.0–8.0)
RBC / HPF: NONE SEEN (ref 0–?)
Specific Gravity, Urine: 1.01 (ref 1.000–1.030)
Total Protein, Urine: NEGATIVE
Urine Glucose: NEGATIVE
Urobilinogen, UA: 0.2 (ref 0.0–1.0)
WBC UA: NONE SEEN (ref 0–?)

## 2016-11-20 LAB — HEPATIC FUNCTION PANEL
ALT: 32 U/L (ref 0–35)
AST: 12 U/L (ref 0–37)
Albumin: 4.4 g/dL (ref 3.5–5.2)
Alkaline Phosphatase: 74 U/L (ref 39–117)
BILIRUBIN TOTAL: 0.5 mg/dL (ref 0.2–1.2)
Bilirubin, Direct: 0.1 mg/dL (ref 0.0–0.3)
Total Protein: 6.7 g/dL (ref 6.0–8.3)

## 2016-11-20 LAB — LIPID PANEL
CHOL/HDL RATIO: 3
CHOLESTEROL: 196 mg/dL (ref 0–200)
HDL: 56.7 mg/dL (ref >39.00)
NONHDL: 139.67
TRIGLYCERIDES: 231 mg/dL — AB (ref 0.0–149.0)
VLDL: 46.2 mg/dL — AB (ref 0.0–40.0)

## 2016-11-20 LAB — LDL CHOLESTEROL, DIRECT: LDL DIRECT: 118 mg/dL

## 2016-12-03 ENCOUNTER — Telehealth: Payer: Self-pay | Admitting: *Deleted

## 2016-12-03 NOTE — Telephone Encounter (Signed)
-----   Message from [name], NP sent at 11/21/2016  7:58 AM EDT ----- Please let patient know that her kidney function is stable. Her calcium was elevated at this time. She taking calcium supplement? If so she should discontinue.  I would like for her to return to the lab in 2 weeks for a PTH with calcium. Diagnosis hypercalcemia.

## 2016-12-03 NOTE — Telephone Encounter (Signed)
Nichole Dominguez-- can you contact [facility] and find out cost of PTH w/calcium?  Notified pt and she voices understanding. Pt would like to know cost of lab test prior to scheduling lab appt. Pt also requests that we send response to her via mychart.

## 2016-12-04 NOTE — Telephone Encounter (Signed)
Nichole Dominguez sorry for the delay. If the patient has the test done here at our office she will get a bill for $163.00, if she has insurance she can bill them to see if they cover anything. If she goes to a [facility] center and pays up front at time of service it will be $94 + a $10 venipuncture for a total of $104.

## 2016-12-04 NOTE — Telephone Encounter (Signed)
Mychart message sent to pt. Awaiting pt reply.

## 2017-11-25 ENCOUNTER — Encounter: Payer: BLUE CROSS/BLUE SHIELD | Admitting: Family

## 2017-12-02 ENCOUNTER — Ambulatory Visit (INDEPENDENT_AMBULATORY_CARE_PROVIDER_SITE_OTHER): Payer: BLUE CROSS/BLUE SHIELD | Admitting: Family

## 2017-12-02 ENCOUNTER — Encounter: Payer: Self-pay | Admitting: Family

## 2017-12-02 VITALS — BP 126/72 | HR 70 | Temp 97.9°F | Resp 18 | Ht 62.0 in | Wt 148.0 lb

## 2017-12-02 DIAGNOSIS — Z0000 Encounter for general adult medical examination without abnormal findings: Secondary | ICD-10-CM | POA: Diagnosis not present

## 2017-12-02 DIAGNOSIS — Z23 Encounter for immunization: Secondary | ICD-10-CM | POA: Diagnosis not present

## 2017-12-02 DIAGNOSIS — Z800 Family history of malignant neoplasm of digestive organs: Secondary | ICD-10-CM | POA: Diagnosis not present

## 2017-12-02 LAB — URINALYSIS, ROUTINE W REFLEX MICROSCOPIC
Bilirubin Urine: NEGATIVE
HGB URINE DIPSTICK: NEGATIVE
Ketones, ur: NEGATIVE
Leukocytes, UA: NEGATIVE
Nitrite: NEGATIVE
RBC / HPF: NONE SEEN (ref 0–?)
Specific Gravity, Urine: 1.015 (ref 1.000–1.030)
Total Protein, Urine: NEGATIVE
URINE GLUCOSE: NEGATIVE
UROBILINOGEN UA: 0.2 (ref 0.0–1.0)
pH: 7 (ref 5.0–8.0)

## 2017-12-02 LAB — LIPID PANEL
Cholesterol: 223 mg/dL — ABNORMAL HIGH (ref 0–200)
HDL: 56.9 mg/dL (ref >39.00)
LDL Cholesterol: 130 mg/dL — ABNORMAL HIGH (ref 0–99)
NonHDL: 166.37
TRIGLYCERIDES: 183 mg/dL — AB (ref 0.0–149.0)
Total CHOL/HDL Ratio: 4
VLDL: 36.6 mg/dL (ref 0.0–40.0)

## 2017-12-02 LAB — CBC WITH DIFFERENTIAL/PLATELET
BASOS PCT: 1.3 % (ref 0.0–3.0)
Basophils Absolute: 0.1 10*3/uL (ref 0.0–0.1)
EOS ABS: 0.1 10*3/uL (ref 0.0–0.7)
Eosinophils Relative: 1.1 % (ref 0.0–5.0)
HCT: 42.9 % (ref 36.0–46.0)
Hemoglobin: 14.7 g/dL (ref 12.0–15.0)
Lymphocytes Relative: 34 % (ref 12.0–46.0)
Lymphs Abs: 1.8 10*3/uL (ref 0.7–4.0)
MCHC: 34.2 g/dL (ref 30.0–36.0)
MCV: 87.5 fl (ref 78.0–100.0)
MONO ABS: 0.5 10*3/uL (ref 0.1–1.0)
Monocytes Relative: 8.8 % (ref 3.0–12.0)
Neutro Abs: 2.9 10*3/uL (ref 1.4–7.7)
Neutrophils Relative %: 54.8 % (ref 43.0–77.0)
Platelets: 356 10*3/uL (ref 150.0–400.0)
RBC: 4.9 Mil/uL (ref 3.87–5.11)
RDW: 12.7 % (ref 11.5–15.5)
WBC: 5.3 10*3/uL (ref 4.0–10.5)

## 2017-12-02 LAB — BASIC METABOLIC PANEL
BUN: 24 mg/dL — ABNORMAL HIGH (ref 6–23)
CALCIUM: 10.1 mg/dL (ref 8.4–10.5)
CHLORIDE: 100 meq/L (ref 96–112)
CO2: 31 mEq/L (ref 19–32)
CREATININE: 0.91 mg/dL (ref 0.40–1.20)
GFR: 67.01 mL/min (ref >60.00)
Glucose, Bld: 103 mg/dL — ABNORMAL HIGH (ref 70–99)
Potassium: 4.9 mEq/L (ref 3.5–5.1)
Sodium: 137 mEq/L (ref 135–145)

## 2017-12-02 LAB — HEPATIC FUNCTION PANEL
ALT: 21 U/L (ref 0–35)
AST: 10 U/L (ref 0–37)
Albumin: 4.3 g/dL (ref 3.5–5.2)
Alkaline Phosphatase: 95 U/L (ref 39–117)
BILIRUBIN TOTAL: 0.5 mg/dL (ref 0.2–1.2)
Bilirubin, Direct: 0.1 mg/dL (ref 0.0–0.3)
Total Protein: 7.1 g/dL (ref 6.0–8.3)

## 2017-12-02 LAB — TSH: TSH: 3.21 u[IU]/mL (ref 0.35–4.50)

## 2017-12-02 MED ORDER — FLUTICASONE PROPIONATE 50 MCG/ACT NA SUSP: 2.0000 | Freq: Every day | NASAL | 3 refills | Status: DC

## 2017-12-02 MED ORDER — OMEPRAZOLE 20 MG PO CPDR: 20.0000 mg | DELAYED_RELEASE_CAPSULE | ORAL | 1 refills | Status: DC

## 2017-12-02 NOTE — Addendum Note (Signed)
Addended by: Kelle Darting A on: 12/02/2017 12:15 PM   Modules accepted: Orders

## 2017-12-02 NOTE — Progress Notes (Signed)
Subjective:    Patient ID: Nichole Dominguez, female    DOB: 05/29/1955, 62 y.o.   MRN: [id number]  HPI  Nichole Dominguez is a 62 yr old female who presents today for cpx.  Immunizations: tdap 2016 Diet: healthy, doing weight watchers Wt Readings from Last 3 Encounters:  12/02/17 148 lb (67.1 kg)  11/19/16 162 lb 9.6 oz (73.8 kg)  01/04/16 156 lb 9.6 oz (71 kg)  Exercise: walks at work Colonoscopy: 5/11,   Dexa: 11/16/14, declines due to cost Pap Smear: -2016, declines Mammogram: 8/16 Dental: up to date Vision: up to date    Review of Systems  Constitutional: Negative for unexpected weight change.  HENT: Negative for hearing loss and rhinorrhea.   Eyes: Negative for visual disturbance.  Respiratory: Negative for cough and shortness of breath.   Cardiovascular: Negative for chest pain and leg swelling.  Gastrointestinal: Negative for blood in stool, constipation and diarrhea.  Genitourinary: Negative for dysuria, frequency and hematuria.  Musculoskeletal: Negative for arthralgias and myalgias.  Skin: Negative for rash.  Neurological: Negative for headaches.  Hematological: Negative for adenopathy.  Psychiatric/Behavioral:       Denies depression/anxiety   Past Medical History:  Diagnosis Date  . Plantar fasciitis   . Seasonal allergies      Social History   Socioeconomic History  . Marital status: Married    Spouse name: Not on file  . Number of children: Not on file  . Years of education: Not on file  . Highest education level: Not on file  Occupational History  . Not on file  Social Needs  . Financial resource strain: Not on file  . Food insecurity:    Worry: Not on file    Inability: Not on file  . Transportation needs:    Medical: Not on file    Non-medical: Not on file  Tobacco Use  . Smoking status: Former Smoker    Last attempt to quit: 10/10/1996    Years since quitting: 21.1  . Smokeless tobacco: Never Used  Substance and Sexual Activity  . Alcohol  use: Yes    Alcohol/week: 0.0 standard drinks    Comment: 1 shot vodka daily  . Drug use: No  . Sexual activity: Yes    Partners: Male  Lifestyle  . Physical activity:    Days per week: Not on file    Minutes per session: Not on file  . Stress: Not on file  Relationships  . Social connections:    Talks on phone: Not on file    Gets together: Not on file    Attends religious service: Not on file    Active member of club or organization: Not on file    Attends meetings of clubs or organizations: Not on file    Relationship status: Not on file  . Intimate partner violence:    Fear of current or ex partner: Not on file    Emotionally abused: Not on file    Physically abused: Not on file    Forced sexual activity: Not on file  Other Topics Concern  . Not on file  Social History Narrative   Works at [employer] in the [employer].    Lives with husband   2 grown children- one in [place] and one in [place], grandchildren   2 grown stepchildren- both local, grandchildren   Enjoys canning/gardening    Past Surgical History:  Procedure Laterality Date  . [place]  Family History  Problem Relation Age of Onset  . Diabetes Mother   . Heart disease Mother        chf  . Hypertension Mother   . Cervical cancer Mother   . Obesity Mother   . Colon cancer Mother        in her 31's  . Thyroid cancer Father   . Cancer Father        thyroid x2  . Breast cancer Sister   . Lupus Sister   . Obesity Maternal Grandmother   . Diabetes Maternal Grandmother   . Heart disease Maternal Grandmother   . Hypertension Maternal Grandmother   . Stroke Maternal Grandmother     Allergies  Allergen Reactions  . Darvon [Propoxyphene] Rash  . Sudafed [Pseudoephedrine Hcl]     rash    Current Outpatient Medications on File Prior to Visit  Medication Sig Dispense Refill  . Calcium Carbonate-Vit D-Min (CALCIUM 1200) 1200-1000 MG-UNIT CHEW Chew 1 tablet by mouth at bedtime.     . diphenhydrAMINE (BENADRYL) 25 mg capsule Take 25 mg by mouth at bedtime as needed.     . fluticasone (FLONASE) 50 MCG/ACT nasal spray USE 2 SPRAYS IN EACH NOSTRIL DAILY 48 g 1  . loratadine (CLARITIN) 10 MG tablet Take 10 mg by mouth daily.    . Multiple Vitamins-Minerals (MULTIVITAMIN WITH MINERALS) tablet Take 1 tablet by mouth daily.    Marland Kitchen omeprazole (PRILOSEC) 20 MG capsule Take 20 mg by mouth every morning.     No current facility-administered medications on file prior to visit.     BP 126/72 (BP Location: Left Arm, Cuff Size: Normal)   Pulse 70   Temp 97.9 F (36.6 C) (Oral)   Resp 18   Ht 5\' 2"  (1.575 m)   Wt 148 lb (67.1 kg)   SpO2 99%   BMI 27.07 kg/m       Objective:   Physical Exam  Physical Exam  Constitutional: She is oriented to person, place, and time. She appears well-developed and well-nourished. No distress.  HENT:  Head: Normocephalic and atraumatic.  Right Ear: Tympanic membrane and ear canal normal.  Left Ear: Tympanic membrane and ear canal normal.  Mouth/Throat: Oropharynx is clear and moist.  Eyes: Pupils are equal, round, and reactive to light. No scleral icterus.  Neck: Normal range of motion. No thyromegaly present.  Cardiovascular: Normal rate and regular rhythm.   No murmur heard. Pulmonary/Chest: Effort normal and breath sounds normal. No respiratory distress. He has no wheezes. She has no rales. She exhibits no tenderness.  Abdominal: Soft. Bowel sounds are normal. She exhibits no distension and no mass. There is no tenderness. There is no rebound and no guarding.  Musculoskeletal: She exhibits no edema.  Lymphadenopathy:    She has no cervical adenopathy.  Neurological: She is alert and oriented to person, place, and time. She has normal patellar reflexes. She exhibits normal muscle tone. Coordination normal.  Skin: Skin is warm and dry.  Psychiatric: She has a normal mood and affect. Her behavior is normal. Judgment and thought content  normal.  Breasts: Examined lying Right: Without masses, retractions, discharge or axillary adenopathy.  Left: Without masses, retractions, discharge or axillary adenopathy.        Assessment & Plan:   Preventative care- encouraged patient to continue healthy diet, exercise and weight loss efforts. Due to family hx of colon cancer in mother recommended that she have colo repeated now rather than waiting 10 years.  She is agreeable to follow up colo. Will request results of previous colo as well from [facility]. Declines pap/dexa/ekg.  Agreeable to Shingrix. Shingrix #1 today.   Allergic rhinitis- stable on flonase/claritin- continue same.  GERD-stable on PPI. Continue same.      Assessment & Plan:

## 2017-12-02 NOTE — Patient Instructions (Signed)
Continue healthy diet and regular exercise. You should be contacted about scheduling your colonoscopy and mammogram.

## 2017-12-04 ENCOUNTER — Encounter: Payer: BLUE CROSS/BLUE SHIELD | Admitting: Family

## 2018-01-06 ENCOUNTER — Ambulatory Visit (INDEPENDENT_AMBULATORY_CARE_PROVIDER_SITE_OTHER): Payer: BLUE CROSS/BLUE SHIELD

## 2018-01-06 DIAGNOSIS — Z23 Encounter for immunization: Secondary | ICD-10-CM | POA: Diagnosis not present

## 2018-01-06 MED ORDER — CYANOCOBALAMIN 1000 MCG/ML IJ SOLN: 1000.0000 ug | Freq: Once | INTRAMUSCULAR | Status: DC

## 2018-01-31 ENCOUNTER — Encounter: Payer: Self-pay | Admitting: Family

## 2018-02-04 ENCOUNTER — Ambulatory Visit (INDEPENDENT_AMBULATORY_CARE_PROVIDER_SITE_OTHER): Payer: BLUE CROSS/BLUE SHIELD

## 2018-02-04 DIAGNOSIS — Z23 Encounter for immunization: Secondary | ICD-10-CM

## 2018-06-04 DIAGNOSIS — K045 Chronic apical periodontitis: Secondary | ICD-10-CM | POA: Diagnosis not present

## 2018-06-18 DIAGNOSIS — H40003 Preglaucoma, unspecified, bilateral: Secondary | ICD-10-CM | POA: Diagnosis not present

## 2018-06-25 ENCOUNTER — Other Ambulatory Visit: Payer: Self-pay | Admitting: Family

## 2018-11-13 ENCOUNTER — Telehealth: Payer: Self-pay | Admitting: Family

## 2018-11-13 NOTE — Telephone Encounter (Signed)
Called patient 2 times for cpe appt. No ans no option to leave  Message

## 2018-11-14 NOTE — Telephone Encounter (Signed)
cpe scheduled

## 2018-12-04 ENCOUNTER — Other Ambulatory Visit: Payer: Self-pay | Admitting: Family

## 2018-12-16 ENCOUNTER — Encounter: Payer: BLUE CROSS/BLUE SHIELD | Admitting: Family

## 2018-12-23 ENCOUNTER — Other Ambulatory Visit: Payer: Self-pay

## 2018-12-24 ENCOUNTER — Other Ambulatory Visit (HOSPITAL_COMMUNITY)
Admission: RE | Admit: 2018-12-24 | Discharge: 2018-12-24 | Disposition: A | Discharge status: Home / Self Care | Payer: BC Managed Care – PPO | Source: Ambulatory Visit | Attending: Family | Admitting: Family

## 2018-12-24 ENCOUNTER — Encounter: Payer: Self-pay | Admitting: Family

## 2018-12-24 ENCOUNTER — Ambulatory Visit (INDEPENDENT_AMBULATORY_CARE_PROVIDER_SITE_OTHER): Payer: BC Managed Care – PPO | Admitting: Family

## 2018-12-24 VITALS — BP 144/72 | HR 75 | Temp 96.8°F | Resp 16 | Ht 61.0 in | Wt 166.0 lb

## 2018-12-24 DIAGNOSIS — Z01419 Encounter for gynecological examination (general) (routine) without abnormal findings: Secondary | ICD-10-CM | POA: Insufficient documentation

## 2018-12-24 DIAGNOSIS — Z0000 Encounter for general adult medical examination without abnormal findings: Secondary | ICD-10-CM

## 2018-12-24 DIAGNOSIS — Z23 Encounter for immunization: Secondary | ICD-10-CM | POA: Diagnosis not present

## 2018-12-24 LAB — LIPID PANEL
Cholesterol: 240 mg/dL — ABNORMAL HIGH (ref 0–200)
HDL: 56.8 mg/dL (ref >39.00)
LDL Cholesterol: 151 mg/dL — ABNORMAL HIGH (ref 0–99)
NonHDL: 183.21
Total CHOL/HDL Ratio: 4
Triglycerides: 160 mg/dL — ABNORMAL HIGH (ref 0.0–149.0)
VLDL: 32 mg/dL (ref 0.0–40.0)

## 2018-12-24 LAB — CBC WITH DIFFERENTIAL/PLATELET
Basophils Absolute: 0.1 10*3/uL (ref 0.0–0.1)
Basophils Relative: 1.3 % (ref 0.0–3.0)
Eosinophils Absolute: 0.1 10*3/uL (ref 0.0–0.7)
Eosinophils Relative: 0.9 % (ref 0.0–5.0)
HCT: 44.3 % (ref 36.0–46.0)
Hemoglobin: 14.9 g/dL (ref 12.0–15.0)
Lymphocytes Relative: 29.5 % (ref 12.0–46.0)
Lymphs Abs: 1.9 10*3/uL (ref 0.7–4.0)
MCHC: 33.6 g/dL (ref 30.0–36.0)
MCV: 90.2 fl (ref 78.0–100.0)
Monocytes Absolute: 0.6 10*3/uL (ref 0.1–1.0)
Monocytes Relative: 8.5 % (ref 3.0–12.0)
Neutro Abs: 3.9 10*3/uL (ref 1.4–7.7)
Neutrophils Relative %: 59.8 % (ref 43.0–77.0)
Platelets: 325 10*3/uL (ref 150.0–400.0)
RBC: 4.92 Mil/uL (ref 3.87–5.11)
RDW: 13.1 % (ref 11.5–15.5)
WBC: 6.5 10*3/uL (ref 4.0–10.5)

## 2018-12-24 LAB — BASIC METABOLIC PANEL
BUN: 13 mg/dL (ref 6–23)
CO2: 28 mEq/L (ref 19–32)
Calcium: 9.7 mg/dL (ref 8.4–10.5)
Chloride: 101 mEq/L (ref 96–112)
Creatinine, Ser: 0.89 mg/dL (ref 0.40–1.20)
GFR: 64.46 mL/min (ref >60.00)
Glucose, Bld: 103 mg/dL — ABNORMAL HIGH (ref 70–99)
Potassium: 4.8 mEq/L (ref 3.5–5.1)
Sodium: 137 mEq/L (ref 135–145)

## 2018-12-24 LAB — HEPATIC FUNCTION PANEL
ALT: 55 U/L — ABNORMAL HIGH (ref 0–35)
AST: 16 U/L (ref 0–37)
Albumin: 4.2 g/dL (ref 3.5–5.2)
Alkaline Phosphatase: 124 U/L — ABNORMAL HIGH (ref 39–117)
Bilirubin, Direct: 0.1 mg/dL (ref 0.0–0.3)
Total Bilirubin: 0.7 mg/dL (ref 0.2–1.2)
Total Protein: 7 g/dL (ref 6.0–8.3)

## 2018-12-24 LAB — TSH: TSH: 3.65 u[IU]/mL (ref 0.35–4.50)

## 2018-12-24 NOTE — Patient Instructions (Signed)
Please complete lab work prior to leaving.  Work on [employer], exercise, weight loss. Please let me know if you would like to proceed with a bone density test. Schedule mammogram on the first floor in imaging. You should be contacted about scheduling your colonoscopy.

## 2018-12-24 NOTE — Progress Notes (Signed)
Subjective:    Patient ID: Nichole Dominguez, female    DOB: [birth year], 63 y.o.   MRN: [id number]  HPI  Patient presents today for complete physical.  Immunizations:  Tdap 2016, dur for Flu shot. Completed shingrix series.  Diet:  Reports that she is eating healthy Wt Readings from Last 3 Encounters:  12/24/18 166 lb (75.3 kg)  12/02/17 148 lb (67.1 kg)  11/19/16 162 lb 9.6 oz (73.8 kg)  Exercise: walks some Colonoscopy: due in January  Dexa: due Pap Smear: due Mammogram: due  Review of Systems  Constitutional: Negative for unexpected weight change.  HENT: Negative for hearing loss and postnasal drip.   Eyes: Negative for visual disturbance.  Respiratory: Negative for cough and shortness of breath.   Cardiovascular: Negative for chest pain and leg swelling.  Gastrointestinal: Negative for blood in stool, constipation and diarrhea.  Genitourinary: Negative for dysuria, frequency and hematuria.  Musculoskeletal: Negative for arthralgias and myalgias.  Skin: Negative for rash.  Neurological: Negative for headaches.  Hematological: Negative for adenopathy.  Psychiatric/Behavioral:       Denies depression/anxiety      Past Medical History:  Diagnosis Date  . Plantar fasciitis   . Seasonal allergies      Social History   Socioeconomic History  . Marital status: Married    Spouse name: Not on file  . Number of children: Not on file  . Years of education: Not on file  . Highest education level: Not on file  Occupational History  . Not on file  Social Needs  . Financial resource strain: Not on file  . Food insecurity    Worry: Not on file    Inability: Not on file  . Transportation needs    Medical: Not on file    Non-medical: Not on file  Tobacco Use  . Smoking status: Former Smoker    Quit date: 10/10/1996    Years since quitting: 22.2  . Smokeless tobacco: Never Used  Substance and Sexual Activity  . Alcohol use: Yes    Alcohol/week: 0.0 standard drinks   Comment: 1 shot vodka daily  . Drug use: No  . Sexual activity: Yes    Partners: Male  Lifestyle  . Physical activity    Days per week: Not on file    Minutes per session: Not on file  . Stress: Not on file  Relationships  . Social Herbalist on phone: Not on file    Gets together: Not on file    Attends religious service: Not on file    Active member of club or organization: Not on file    Attends meetings of clubs or organizations: Not on file    Relationship status: Not on file  . Intimate partner violence    Fear of current or ex partner: Not on file    Emotionally abused: Not on file    Physically abused: Not on file    Forced sexual activity: Not on file  Other Topics Concern  . Not on file  Social History Narrative   Works at [employer] in the [employer].    Lives with husband   2 grown children- one in [place] and one in [place], grandchildren   2 grown stepchildren- both local, grandchildren   Enjoys canning/gardening    Past Surgical History:  Procedure Laterality Date  . TUBAL LIGATION  1992    Family History  Problem Relation Age of Onset  . Diabetes Mother   .  Heart disease Mother        chf  . Hypertension Mother   . Cervical cancer Mother   . Obesity Mother   . Colon cancer Mother        in her 13's  . Thyroid cancer Father   . Cancer Father        thyroid x2  . Breast cancer Sister   . Lupus Sister   . Obesity Maternal Grandmother   . Diabetes Maternal Grandmother   . Heart disease Maternal Grandmother   . Hypertension Maternal Grandmother   . Stroke Maternal Grandmother     Allergies  Allergen Reactions  . Darvon [Propoxyphene] Rash  . Sudafed [Pseudoephedrine Hcl]     rash    Current Outpatient Medications on File Prior to Visit  Medication Sig Dispense Refill  . Calcium Carbonate-Vit D-Min (CALCIUM 1200) 1200-1000 MG-UNIT CHEW Chew 1 tablet by mouth at bedtime.    . diphenhydrAMINE (BENADRYL) 25 mg capsule Take  25 mg by mouth at bedtime as needed.     . fluticasone (FLONASE) 50 MCG/ACT nasal spray USE 2 SPRAYS IN EACH NOSTRIL DAILY 48 g 3  . loratadine (CLARITIN) 10 MG tablet Take 10 mg by mouth daily.    . Multiple Vitamins-Minerals (MULTIVITAMIN WITH MINERALS) tablet Take 1 tablet by mouth daily.    Marland Kitchen omeprazole (PRILOSEC) 20 MG capsule TAKE 1 CAPSULE EVERY MORNING 90 capsule 3   Current Facility-Administered Medications on File Prior to Visit  Medication Dose Route Frequency Provider Last Rate Last Dose  . cyanocobalamin ((VITAMIN B-12)) injection 1,000 mcg  1,000 mcg Intramuscular Once [name], NP        BP (!) 152/75 (BP Location: Right Arm, Patient Position: Sitting, Cuff Size: Small)   Pulse 75   Temp (!) 96.8 F (36 C) (Temporal)   Resp 16   Ht 5\' 1"  (1.549 m)   Wt 166 lb (75.3 kg)   SpO2 100%   BMI 31.37 kg/m       Objective:   Physical Exam  Physical Exam  Constitutional: She is oriented to person, place, and time. She appears well-developed and well-nourished. No distress.  HENT:  Head: Normocephalic and atraumatic.  Right Ear: Tympanic membrane and ear canal normal.  Left Ear: Tympanic membrane and ear canal normal.  Mouth/Throat: not examined- pt wearing mask Eyes: Pupils are equal, round, and reactive to light. No scleral icterus.  Neck: Normal range of motion. No thyromegaly present.  Cardiovascular: Normal rate and regular rhythm.   No murmur heard. Pulmonary/Chest: Effort normal and breath sounds normal. No respiratory distress. He has no wheezes. She has no rales. She exhibits no tenderness.  Abdominal: Soft. Bowel sounds are normal. She exhibits no distension and no mass. There is no tenderness. There is no rebound and no guarding.  Musculoskeletal: She exhibits no edema.  Lymphadenopathy:    She has no cervical adenopathy.  Neurological: She is alert and oriented to person, place, and time. She has normal patellar reflexes. She exhibits normal  muscle tone. Coordination normal.  Skin: Skin is warm and dry.  Psychiatric: She has a normal mood and affect. Her behavior is normal. Judgment and thought content normal.  Breasts: Examined lying Right: Without masses, retractions, discharge or axillary adenopathy.  Left: Without masses, retractions, discharge or axillary adenopathy.  Inguinal/mons: Normal without inguinal adenopathy  External genitalia: Normal  BUS/Urethra/Skene's glands: Normal  Bladder: Normal  Vagina: Normal  Cervix: Normal  Uterus: normal in size, shape and  contour. Midline and mobile  Adnexa/parametria:  Rt: Without masses or tenderness.  Lt: Without masses or tenderness.  Anus and perineum: Normal            Assessment & Plan:   Preventative care- discussed healthy diet, exercise and weight loss. Refer for colo, mammo. Declines dexa today, wants to check coverage with her insurance. Obtain routine lab work. Pap performed today. Flu shot today.   Elevated blood pressure reading- she has a bp cuff at home. I have asked her to check bp once daily x 1 week and send me her readings via mychart. Discussed low sodium diet, weight loss to help with blood pressure.     Assessment & Plan:

## 2018-12-24 NOTE — Addendum Note (Signed)
Addended by: Jiles Prows on: 12/24/2018 09:50 AM   Modules accepted: Orders

## 2018-12-28 LAB — CYTOLOGY - PAP
Diagnosis: NEGATIVE
High risk HPV: NEGATIVE
Molecular Disclaimer: 56
Molecular Disclaimer: DETECTED
Molecular Disclaimer: NORMAL

## 2018-12-29 ENCOUNTER — Encounter: Payer: Self-pay | Admitting: Family

## 2018-12-31 ENCOUNTER — Ambulatory Visit (HOSPITAL_BASED_OUTPATIENT_CLINIC_OR_DEPARTMENT_OTHER)
Admission: RE | Admit: 2018-12-31 | Discharge: 2018-12-31 | Disposition: A | Discharge status: Home / Self Care | Payer: BC Managed Care – PPO | Source: Ambulatory Visit | Attending: Family | Admitting: Family

## 2018-12-31 ENCOUNTER — Other Ambulatory Visit: Payer: Self-pay

## 2018-12-31 DIAGNOSIS — Z1231 Encounter for screening mammogram for malignant neoplasm of breast: Secondary | ICD-10-CM | POA: Insufficient documentation

## 2018-12-31 DIAGNOSIS — Z0000 Encounter for general adult medical examination without abnormal findings: Secondary | ICD-10-CM | POA: Insufficient documentation

## 2019-04-30 DIAGNOSIS — Z20828 Contact with and (suspected) exposure to other viral communicable diseases: Secondary | ICD-10-CM | POA: Diagnosis not present

## 2019-05-04 ENCOUNTER — Other Ambulatory Visit: Payer: Self-pay

## 2019-05-04 ENCOUNTER — Encounter: Payer: Self-pay | Admitting: Emergency Medicine

## 2019-05-04 ENCOUNTER — Encounter: Payer: Self-pay | Admitting: Family

## 2019-05-04 ENCOUNTER — Telehealth: Payer: BC Managed Care – PPO

## 2019-05-04 ENCOUNTER — Ambulatory Visit
Admission: EM | Admit: 2019-05-04 | Discharge: 2019-05-04 | Disposition: A | Discharge status: Home / Self Care | Payer: BC Managed Care – PPO | Attending: Emergency Medicine | Admitting: Emergency Medicine

## 2019-05-04 DIAGNOSIS — R1011 Right upper quadrant pain: Secondary | ICD-10-CM

## 2019-05-04 DIAGNOSIS — R822 Biliuria: Secondary | ICD-10-CM

## 2019-05-04 DIAGNOSIS — R17 Unspecified jaundice: Secondary | ICD-10-CM

## 2019-05-04 DIAGNOSIS — R140 Abdominal distension (gaseous): Secondary | ICD-10-CM

## 2019-05-04 DIAGNOSIS — R1013 Epigastric pain: Secondary | ICD-10-CM

## 2019-05-04 LAB — POCT URINALYSIS DIP (MANUAL ENTRY)
Blood, UA: NEGATIVE
Glucose, UA: NEGATIVE mg/dL
Ketones, POC UA: NEGATIVE mg/dL
Leukocytes, UA: NEGATIVE
Nitrite, UA: NEGATIVE
Protein Ur, POC: NEGATIVE mg/dL
Spec Grav, UA: 1.02 (ref 1.010–1.025)
Urobilinogen, UA: 1 E.U./dL
pH, UA: 6 (ref 5.0–8.0)

## 2019-05-04 NOTE — ED Provider Notes (Signed)
[facility]    CSN: [id number] Arrival date & time: 05/04/19  1121      History   Chief Complaint Chief Complaint  Patient presents with  . Abdominal Pain  . Emesis    HPI Nichole Dominguez is a 64 y.o. female with history of thyroid nodule, seasonal allergies presenting for RUQ/epigastric abdominal pain and emesis.  States that she had multiple episodes of nonbiliary, nonbloody emesis on Friday.  Did not eat Saturday, and felt better Sunday.  States that she ate an egg sandwich, and later some beings after which she had profuse vomiting.  Denies projectile vomiting, blood or bile this time.  Patient did have chills last night for which she used a heating pad and Benadryl with some relief.  Patient does admit to nausea, "it has been since Friday"as well as abdominal distention.  Patient denies history of pancreatitis, hepatitis, cholelithiasis, cirrhosis.  Denies fever, chest pain, shortness of breath.  Patient does admit to alcohol use, "2-3 shots of vodka a night "and has been doing this for the last year or so.  States she used to drink wine, that caused her to gain weight.  Patient does endorse history of GERD for which she takes Prilosec with significant relief and reflux.  Patient denies history of gastritis, PUD, colitis.  Patient denies change in bowel habit: No hematochezia, melena.  Patient did note bright orange urine on Friday which is slowly improved.    Past Medical History:  Diagnosis Date  . Plantar fasciitis   . Seasonal allergies     Patient Active Problem List   Diagnosis Date Noted  . Pap smear for cervical cancer screening 10/06/2014  . Thyroid nodule 10/06/2014  . Routine general medical examination at a health care facility 08/21/2013    Past Surgical History:  Procedure Laterality Date  . TUBAL LIGATION  1992    OB History   No obstetric history on file.      Home Medications    Prior to Admission medications   Medication Sig Start Date  End Date Taking? Authorizing Provider  Calcium Carbonate-Vit D-Min (CALCIUM 1200) 1200-1000 MG-UNIT CHEW Chew 1 tablet by mouth at bedtime.    [provider]  diphenhydrAMINE (BENADRYL) 25 mg capsule Take 25 mg by mouth at bedtime as needed.     [provider]  fluticasone (FLONASE) 50 MCG/ACT nasal spray USE 2 SPRAYS IN EACH NOSTRIL DAILY 12/04/18   [name], NP  loratadine (CLARITIN) 10 MG tablet Take 10 mg by mouth daily.    [provider]  Multiple Vitamins-Minerals (MULTIVITAMIN WITH MINERALS) tablet Take 1 tablet by mouth daily.    [provider]  omeprazole (PRILOSEC) 20 MG capsule TAKE 1 CAPSULE EVERY MORNING 06/25/18   [name], NP    Family History Family History  Problem Relation Age of Onset  . Diabetes Mother   . Heart disease Mother        chf  . Hypertension Mother   . Cervical cancer Mother   . Obesity Mother   . Colon cancer Mother        in her 69's  . Thyroid cancer Father   . Cancer Father        thyroid x2  . Heart attack Father   . Breast cancer Sister   . Lupus Sister   . Obesity Maternal Grandmother   . Diabetes Maternal Grandmother   . Heart disease Maternal Grandmother   . Hypertension Maternal Grandmother   .  Stroke Maternal Grandmother   . Heart attack Son     Social History Social History   Tobacco Use  . Smoking status: Former Smoker    Quit date: 10/10/1996    Years since quitting: 22.5  . Smokeless tobacco: Never Used  Substance Use Topics  . Alcohol use: Yes    Alcohol/week: 0.0 standard drinks    Comment: 1 shot vodka daily  . Drug use: No     Allergies   Darvon [propoxyphene] and Sudafed [pseudoephedrine hcl]   Review of Systems As per HPI   Physical Exam Triage Vital Signs ED Triage Vitals  Enc Vitals Group     BP      Pulse      Resp      Temp      Temp src      SpO2      Weight      Height      Head Circumference      Peak Flow      Pain Score       Pain Loc      Pain Edu?      Excl. in [place]?    No data found.  Updated Vital Signs BP (!) 158/97 (BP Location: Left Arm)   Pulse 88   Temp 97.6 F (36.4 C) (Temporal)   Resp 16   SpO2 95%   Visual Acuity Right Eye Distance:   Left Eye Distance:   Bilateral Distance:    Right Eye Near:   Left Eye Near:    Bilateral Near:     Physical Exam Constitutional:      General: She is not in acute distress.    Appearance: She is well-developed. She is ill-appearing. She is not diaphoretic.  HENT:     Head: Normocephalic and atraumatic.     Mouth/Throat:     Mouth: Mucous membranes are moist.     Pharynx: Oropharynx is clear. No oropharyngeal exudate.     Comments: No erythema Eyes:     General: Scleral icterus present.     Pupils: Pupils are equal, round, and reactive to light.     Comments: Trace scleral icterus bilaterally  Neck:     Comments: Superior bilateral anterior chain cervical VS bilateral submandibular LAD Cardiovascular:     Rate and Rhythm: Normal rate and regular rhythm.  Pulmonary:     Effort: Pulmonary effort is normal. No respiratory distress.     Breath sounds: No wheezing or rales.  Abdominal:     General: Abdomen is protuberant. Bowel sounds are normal. There is distension. There is no abdominal bruit.     Palpations: There is no hepatomegaly, splenomegaly or pulsatile mass.     Tenderness: There is abdominal tenderness in the right upper quadrant. There is no guarding or rebound. Positive signs include Murphy's sign. Negative signs include Rovsing's sign and McBurney's sign.     Comments: Visible abdominal veins: Patient is unsure if this is chronic for her  Musculoskeletal:     Cervical back: Normal range of motion and neck supple. Tenderness present. No rigidity.  Skin:    General: Skin is warm.     Capillary Refill: Capillary refill takes less than 2 seconds.     Coloration: Skin is not cyanotic, jaundiced, mottled or pale.     Findings: No rash.    Neurological:     General: No focal deficit present.     Mental Status: She is alert and oriented  to person, place, and time.      UC Treatments / Results  Labs (all labs ordered are listed, but only abnormal results are displayed) Labs Reviewed  POCT URINALYSIS DIP (MANUAL ENTRY) - Abnormal; Notable for the following components:      Result Value   Bilirubin, UA moderate (*)    All other components within normal limits    EKG   Radiology No results found.  Procedures Procedures (including critical care time)  Medications Ordered in UC Medications - No data to display  Initial Impression / Assessment and Plan / UC Course  I have reviewed the triage vital signs and the nursing notes.  Pertinent labs & imaging results that were available during my care of the patient were reviewed by me and considered in my medical decision making (see chart for details).    I have reviewed the triage vital signs and the nursing notes.  All pertinent labs & imaging results that were available during my care of the patient were reviewed by me and considered in my medical decision making (see chart for details).  Patient afebrile, nontoxic in office today.  POC urine dipstick does show moderate bilirubin-reviewed with patient that this is a byproduct of heme breakdown and increases suspicion for possible upper GI bleeding.  Patient denies melena, hematochezia: Hemoccult deferred.  Given chronic alcohol misuse, abdominal distention, trace scleral icterus, right upper quadrant pain and bilirubinuria, I discussed with patient she requires higher level of care to rule out occult GI bleed versus decompensation of liver versus cholangitis given chills last night.  Recommended patient go to ER for further evaluation/management.  Return precautions discussed, patient verbalized understanding and is agreeable to plan. Final Clinical Impressions(s) / UC Diagnoses   Final diagnoses:  RUQ pain   Epigastric abdominal pain  Bilirubinuria  Scleral icterus  Abdominal distention, non-gaseous     Discharge Instructions     Recommend you go to ER for further evaluation of your symptoms.    ED Prescriptions    None     PDMP not reviewed this encounter.   Hall-Potvin, [place], [place] 05/04/19 2008

## 2019-05-04 NOTE — ED Triage Notes (Signed)
Pt presents to [facility] for assessment of multiple episodes of emesis Friday night.  States she did not eat anything all day Saturday, so she felt "okay", but states yesterday she began having emesis again around midnight and threw up "until my stomach was empty".  Patient c/o epigastric pain and bilateral mid back pain starting Sunday as well.  No abdominal surgeries in the past.  Also c/o "orange colored" urine.

## 2019-05-04 NOTE — Discharge Instructions (Addendum)
Recommend you go to ER for further evaluation of your symptoms. °

## 2019-07-14 ENCOUNTER — Other Ambulatory Visit: Payer: Self-pay | Admitting: Family

## 2019-11-29 ENCOUNTER — Other Ambulatory Visit: Payer: Self-pay | Admitting: Family

## 2019-12-28 ENCOUNTER — Other Ambulatory Visit: Payer: Self-pay

## 2019-12-28 ENCOUNTER — Ambulatory Visit (INDEPENDENT_AMBULATORY_CARE_PROVIDER_SITE_OTHER): Payer: BC Managed Care – PPO | Admitting: Family

## 2019-12-28 ENCOUNTER — Encounter: Payer: Self-pay | Admitting: Family

## 2019-12-28 VITALS — BP 118/74 | HR 76 | Resp 16 | Ht 61.0 in | Wt 172.0 lb

## 2019-12-28 DIAGNOSIS — E1169 Type 2 diabetes mellitus with other specified complication: Secondary | ICD-10-CM | POA: Diagnosis not present

## 2019-12-28 DIAGNOSIS — Z0001 Encounter for general adult medical examination with abnormal findings: Secondary | ICD-10-CM | POA: Diagnosis not present

## 2019-12-28 DIAGNOSIS — E785 Hyperlipidemia, unspecified: Secondary | ICD-10-CM | POA: Diagnosis not present

## 2019-12-28 DIAGNOSIS — E2839 Other primary ovarian failure: Secondary | ICD-10-CM | POA: Diagnosis not present

## 2019-12-28 DIAGNOSIS — Z0000 Encounter for general adult medical examination without abnormal findings: Secondary | ICD-10-CM

## 2019-12-28 DIAGNOSIS — R590 Localized enlarged lymph nodes: Secondary | ICD-10-CM

## 2019-12-28 LAB — LIPID PANEL
Cholesterol: 254 mg/dL — ABNORMAL HIGH (ref <200)
HDL: 61 mg/dL (ref >=50)
LDL Cholesterol (Calc): 168 mg/dL (calc) — ABNORMAL HIGH
Non-HDL Cholesterol (Calc): 193 mg/dL (calc) — ABNORMAL HIGH (ref <130)
Total CHOL/HDL Ratio: 4.2 (calc) (ref <5.0)
Triglycerides: 120 mg/dL (ref <150)

## 2019-12-28 LAB — CBC WITH DIFFERENTIAL/PLATELET
Absolute Monocytes: 687 cells/uL (ref 200–950)
Basophils Absolute: 68 cells/uL (ref 0–200)
Basophils Relative: 1 %
Eosinophils Absolute: 68 cells/uL (ref 15–500)
Eosinophils Relative: 1 %
HCT: 46.2 % — ABNORMAL HIGH (ref 35.0–45.0)
Hemoglobin: 15.3 g/dL (ref 11.7–15.5)
Lymphs Abs: 2088 cells/uL (ref 850–3900)
MCH: 29 pg (ref 27.0–33.0)
MCHC: 33.1 g/dL (ref 32.0–36.0)
MCV: 87.7 fL (ref 80.0–100.0)
MPV: 9.9 fL (ref 7.5–12.5)
Monocytes Relative: 10.1 %
Neutro Abs: 3890 cells/uL (ref 1500–7800)
Neutrophils Relative %: 57.2 %
Platelets: 375 10*3/uL (ref 140–400)
RBC: 5.27 10*6/uL — ABNORMAL HIGH (ref 3.80–5.10)
RDW: 12.9 % (ref 11.0–15.0)
Total Lymphocyte: 30.7 %
WBC: 6.8 10*3/uL (ref 3.8–10.8)

## 2019-12-28 LAB — COMPREHENSIVE METABOLIC PANEL
AG Ratio: 1.5 (calc) (ref 1.0–2.5)
ALT: 49 U/L — ABNORMAL HIGH (ref 6–29)
AST: 18 U/L (ref 10–35)
Albumin: 4.4 g/dL (ref 3.6–5.1)
Alkaline phosphatase (APISO): 91 U/L (ref 37–153)
BUN/Creatinine Ratio: 15 (calc) (ref 6–22)
BUN: 15 mg/dL (ref 7–25)
CO2: 30 mmol/L (ref 20–32)
Calcium: 9.8 mg/dL (ref 8.6–10.4)
Chloride: 101 mmol/L (ref 98–110)
Creat: 1.02 mg/dL — ABNORMAL HIGH (ref 0.50–0.99)
Globulin: 2.9 g/dL (calc) (ref 1.9–3.7)
Glucose, Bld: 106 mg/dL — ABNORMAL HIGH (ref 65–99)
Potassium: 5 mmol/L (ref 3.5–5.3)
Sodium: 139 mmol/L (ref 135–146)
Total Bilirubin: 0.7 mg/dL (ref 0.2–1.2)
Total Protein: 7.3 g/dL (ref 6.1–8.1)

## 2019-12-28 NOTE — Patient Instructions (Addendum)
Please call [facility] to schedule your colonoscopy.  Schedule bone density and mammogram on the first floor. Continue your work on healthy diet, exercise and weight loss.    Preventive Care 98-62 Years Old, Female Preventive care refers to visits with your health care provider and lifestyle choices that can promote health and wellness. This includes:  A yearly physical exam. This may also be called an annual well check.  Regular dental visits and eye exams.  Immunizations.  Screening for certain conditions.  Healthy lifestyle choices, such as eating a healthy diet, getting regular exercise, not using drugs or products that contain nicotine and tobacco, and limiting alcohol use. What can I expect for my preventive care visit? Physical exam Your health care provider will check your:  Height and weight. This may be used to calculate body mass index (BMI), which tells if you are at a healthy weight.  Heart rate and blood pressure.  Skin for abnormal spots. Counseling Your health care provider may ask you questions about your:  Alcohol, tobacco, and drug use.  Emotional well-being.  Home and relationship well-being.  Sexual activity.  Eating habits.  Work and work Statistician.  Method of birth control.  Menstrual cycle.  Pregnancy history. What immunizations do I need?  Influenza (flu) vaccine  This is recommended every year. Tetanus, diphtheria, and pertussis (Tdap) vaccine  You may need a Td booster every 10 years. Varicella (chickenpox) vaccine  You may need this if you have not been vaccinated. Zoster (shingles) vaccine  You may need this after age 37. Measles, mumps, and rubella (MMR) vaccine  You may need at least one dose of MMR if you were born in 1957 or later. You may also need a second dose. Pneumococcal conjugate (PCV13) vaccine  You may need this if you have certain conditions and were not previously vaccinated. Pneumococcal polysaccharide  (PPSV23) vaccine  You may need one or two doses if you smoke cigarettes or if you have certain conditions. Meningococcal conjugate (MenACWY) vaccine  You may need this if you have certain conditions. Hepatitis A vaccine  You may need this if you have certain conditions or if you travel or work in places where you may be exposed to hepatitis A. Hepatitis B vaccine  You may need this if you have certain conditions or if you travel or work in places where you may be exposed to hepatitis B. Haemophilus influenzae type b (Hib) vaccine  You may need this if you have certain conditions. Human papillomavirus (HPV) vaccine  If recommended by your health care provider, you may need three doses over 6 months. You may receive vaccines as individual doses or as more than one vaccine together in one shot (combination vaccines). Talk with your health care provider about the risks and benefits of combination vaccines. What tests do I need? Blood tests  Lipid and cholesterol levels. These may be checked every 5 years, or more frequently if you are over 57 years old.  Hepatitis C test.  Hepatitis B test. Screening  Lung cancer screening. You may have this screening every year starting at age 13 if you have a 30-pack-year history of smoking and currently smoke or have quit within the past 15 years.  Colorectal cancer screening. All adults should have this screening starting at age 89 and continuing until age 45. Your health care provider may recommend screening at age 85 if you are at increased risk. You will have tests every 1-10 years, depending on your results  and the type of screening test.  Diabetes screening. This is done by checking your blood sugar (glucose) after you have not eaten for a while (fasting). You may have this done every 1-3 years.  Mammogram. This may be done every 1-2 years. Talk with your health care provider about when you should start having regular mammograms. This may  depend on whether you have a family history of breast cancer.  BRCA-related cancer screening. This may be done if you have a family history of breast, ovarian, tubal, or peritoneal cancers.  Pelvic exam and Pap test. This may be done every 3 years starting at age 25. Starting at age 76, this may be done every 5 years if you have a Pap test in combination with an HPV test. Other tests  Sexually transmitted disease (STD) testing.  Bone density scan. This is done to screen for osteoporosis. You may have this scan if you are at high risk for osteoporosis. Follow these instructions at home: Eating and drinking  Eat a diet that includes fresh fruits and vegetables, whole grains, lean protein, and low-fat dairy.  Take vitamin and mineral supplements as recommended by your health care provider.  Do not drink alcohol if: ? Your health care provider tells you not to drink. ? You are pregnant, may be pregnant, or are planning to become pregnant.  If you drink alcohol: ? Limit how much you have to 0-1 drink a day. ? Be aware of how much alcohol is in your drink. In the [place] one drink equals one 12 oz bottle of beer (355 mL), one 5 oz glass of wine (148 mL), or one 1 oz glass of hard liquor (44 mL). Lifestyle  Take daily care of your teeth and gums.  Stay active. Exercise for at least 30 minutes on 5 or more days each week.  Do not use any products that contain nicotine or tobacco, such as cigarettes, e-cigarettes, and chewing tobacco. If you need help quitting, ask your health care provider.  If you are sexually active, practice safe sex. Use a condom or other form of birth control (contraception) in order to prevent pregnancy and STIs (sexually transmitted infections).  If told by your health care provider, take low-dose aspirin daily starting at age 28. What's next?  Visit your health care provider once a year for a well check visit.  Ask your health care provider how often you should  have your eyes and teeth checked.  Stay up to date on all vaccines. This information is not intended to replace advice given to you by your health care provider. Make sure you discuss any questions you have with your health care provider. Document Revised: 12/05/2017 Document Reviewed: 12/05/2017 Elsevier Patient Education  2020 [employer].

## 2019-12-28 NOTE — Progress Notes (Signed)
Subjective:    Patient ID: Nichole Dominguez, female    DOB: 1955/06/17, 64 y.o.   MRN: [id number]  HPI  Patient presents today for complete physical.  Immunizations: shingrix completed, Tdap 2016.  Diet: working on her diet Wt Readings from Last 3 Encounters:  12/28/19 172 lb (78 kg)  12/24/18 166 lb (75.3 kg)  12/02/17 148 lb (67.1 kg)  Exercise:  walks Colonoscopy:  1/11 Dexa: 2016 Pap Smear: 12/24/18 Mammogram: 12/31/18 Vision:8/21 Dental: up to date   Review of Systems  Constitutional: Negative for unexpected weight change.  HENT: Negative for hearing loss and rhinorrhea.   Eyes: Negative for visual disturbance.  Respiratory: Negative for cough and shortness of breath.   Cardiovascular: Negative for chest pain.  Gastrointestinal: Negative for blood in stool, constipation and diarrhea.  Genitourinary: Negative for hematuria.  Musculoskeletal: Negative for arthralgias and myalgias.  Skin: Negative for rash.  Neurological: Negative for headaches.  Hematological: Positive for adenopathy (? maybe on ner neck).  Psychiatric/Behavioral:       Denies depression/anxiety   Past Medical History:  Diagnosis Date   Plantar fasciitis    Seasonal allergies      Social History   Socioeconomic History   Marital status: Married    Spouse name: Not on file   Number of children: Not on file   Years of education: Not on file   Highest education level: Not on file  Occupational History   Not on file  Tobacco Use   Smoking status: Former Smoker    Quit date: 10/10/1996    Years since quitting: 23.2   Smokeless tobacco: Never Used  Substance and Sexual Activity   Alcohol use: Yes    Alcohol/week: 0.0 standard drinks    Comment: 1 shot vodka daily   Drug use: No   Sexual activity: Yes    Partners: Male  Other Topics Concern   Not on file  Social History Narrative   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    Lives with husband   2 grown  children- one in [place] and one in [place], grandchildren   2 grown stepchildren- both Radiation protection practitioner, grandchildren   Enjoys Barista   Social Determinants of Radio broadcast assistant Strain:    Difficulty of Paying Living Expenses: Not on file  Food Insecurity:    Worried About Charity fundraiser in the Last Year: Not on file   [employer] of Food in the Last Year: Not on file  Transportation Needs:    Lack of Transportation (Medical): Not on file   Lack of Transportation (Non-Medical): Not on file  Physical Activity:    Days of Exercise per Week: Not on file   Minutes of Exercise per Session: Not on file  Stress:    Feeling of Stress : Not on file  Social Connections:    Frequency of Communication with Friends and Family: Not on file   Frequency of Social Gatherings with Friends and Family: Not on file   Attends Religious Services: Not on file   Active Member of Clubs or Organizations: Not on file   Attends Archivist Meetings: Not on file   Marital Status: Not on file  Intimate Partner Violence:    Fear of Current or Ex-Partner: Not on file   Emotionally Abused: Not on file   Physically Abused: Not on file   Sexually Abused: Not on file    Past Surgical History:  Procedure Laterality Date  TUBAL LIGATION  1992    Family History  Problem Relation Age of Onset   Diabetes Mother    Heart disease Mother        chf   Hypertension Mother    Cervical cancer Mother    Obesity Mother    Colon cancer Mother        in her 89's   Thyroid cancer Father    Cancer Father        thyroid x2   Heart attack Father    Breast cancer Sister    Lupus Sister    Obesity Maternal Grandmother    Diabetes Maternal Grandmother    Heart disease Maternal Grandmother    Hypertension Maternal Grandmother    Stroke Maternal Grandmother    Heart attack Son     Allergies  Allergen Reactions   Darvon [Propoxyphene] Rash   Sudafed  [Pseudoephedrine Hcl]     rash    Current Outpatient Medications on File Prior to Visit  Medication Sig Dispense Refill   Calcium Carbonate-Vit D-Min (CALCIUM 1200) 1200-1000 MG-UNIT CHEW Chew 1 tablet by mouth at bedtime.     diphenhydrAMINE (BENADRYL) 25 mg capsule Take 25 mg by mouth at bedtime as needed.      fluticasone (FLONASE) 50 MCG/ACT nasal spray USE 2 SPRAYS IN EACH NOSTRIL DAILY 48 g 3   loratadine (CLARITIN) 10 MG tablet Take 10 mg by mouth daily.     Multiple Vitamins-Minerals (MULTIVITAMIN WITH MINERALS) tablet Take 1 tablet by mouth daily.     omeprazole (PRILOSEC) 20 MG capsule TAKE 1 CAPSULE EVERY MORNING 90 capsule 3   Current Facility-Administered Medications on File Prior to Visit  Medication Dose Route Frequency Provider Last Rate Last Admin   cyanocobalamin ((VITAMIN B-12)) injection 1,000 mcg  1,000 mcg Intramuscular Once [name], NP        BP 118/74 (BP Location: Right Arm, Patient Position: Sitting, Cuff Size: Normal)    Pulse 76    Resp 16    Ht 5\' 1"  (1.549 m)    Wt 172 lb (78 kg)    SpO2 98%    BMI 32.50 kg/m       Objective:   Physical Exam  Physical Exam  Constitutional: She is oriented to person, place, and time. She appears well-developed and well-nourished. No distress.  HENT:  Head: Normocephalic and atraumatic.  Right Ear: Tympanic membrane and ear canal normal.  Left Ear: Tympanic membrane and ear canal normal.  Mouth/Throat: Not examined- pt wearing mask Eyes: Pupils are equal, round, and reactive to light. No scleral icterus.  Neck: Normal range of motion. No thyromegaly present. ? Enlarged lymph node right upper anterior neck Cardiovascular: Normal rate and regular rhythm.   No murmur heard. Pulmonary/Chest: Effort normal and breath sounds normal. No respiratory distress. He has no wheezes. She has no rales. She exhibits no tenderness.  Abdominal: Soft. Bowel sounds are normal. She exhibits no distension and no mass.  There is no tenderness. There is no rebound and no guarding.  Musculoskeletal: She exhibits no edema.  Lymphadenopathy:    She has no cervical adenopathy.  Neurological: She is alert and oriented to person, place, and time. She has normal patellar reflexes. She exhibits normal muscle tone. Coordination normal.  Skin: Skin is warm and dry.  Psychiatric: She has a normal mood and affect. Her behavior is normal. Judgment and thought content normal.  Breast/pelvic: deferred       Assessment & Plan:  Preventative care- encouraged pt to continue her heathy diet and regular exercise. Due for mammo/colo/dexa. Flu shot today. Tetanus/shingrix and covid vaccine are up to date.  Enlarged lymph node- will re-evaluate in 3 weeks. If not improved plan to obtain neck imaging. Check cbc.   This visit occurred during the SARS-CoV-2 public health emergency.  Safety protocols were in place, including screening questions prior to the visit, additional usage of staff PPE, and extensive cleaning of exam room while observing appropriate contact time as indicated for disinfecting solutions.         Assessment & Plan:

## 2019-12-29 ENCOUNTER — Telehealth: Payer: Self-pay | Admitting: Family

## 2019-12-29 DIAGNOSIS — R7989 Other specified abnormal findings of blood chemistry: Secondary | ICD-10-CM

## 2019-12-29 NOTE — Telephone Encounter (Signed)
One of her liver tests has been mildly elevated the last 2 times that we have checked it. Most likely cause is fatty liver. I would recommend that she complete an abdominal ultrasound.   Sugar and cholesterol are also elevated. Please continue to work on low fat/low cholesterol diet, exercise and weight loss.

## 2019-12-29 NOTE — Telephone Encounter (Signed)
Called but no answer and mailbox was full. Will try again later.

## 2019-12-30 ENCOUNTER — Encounter: Payer: Self-pay | Admitting: Family

## 2020-01-01 NOTE — Telephone Encounter (Signed)
Results and provider's advise given to patient. She verbalizes understanding but declines getting an ultra sound at this time. She will follow up In about 6 months.

## 2020-01-04 ENCOUNTER — Other Ambulatory Visit: Payer: Self-pay

## 2020-01-04 ENCOUNTER — Encounter: Payer: Self-pay | Admitting: Family

## 2020-01-04 ENCOUNTER — Ambulatory Visit (HOSPITAL_BASED_OUTPATIENT_CLINIC_OR_DEPARTMENT_OTHER)
Admission: RE | Admit: 2020-01-04 | Discharge: 2020-01-04 | Disposition: A | Discharge status: Home / Self Care | Payer: BC Managed Care – PPO | Source: Ambulatory Visit | Attending: Family | Admitting: Family

## 2020-01-04 ENCOUNTER — Encounter (HOSPITAL_BASED_OUTPATIENT_CLINIC_OR_DEPARTMENT_OTHER): Payer: Self-pay

## 2020-01-04 DIAGNOSIS — M8589 Other specified disorders of bone density and structure, multiple sites: Secondary | ICD-10-CM | POA: Diagnosis not present

## 2020-01-04 DIAGNOSIS — M8580 Other specified disorders of bone density and structure, unspecified site: Secondary | ICD-10-CM

## 2020-01-04 DIAGNOSIS — Z1231 Encounter for screening mammogram for malignant neoplasm of breast: Secondary | ICD-10-CM | POA: Diagnosis not present

## 2020-01-04 DIAGNOSIS — Z0000 Encounter for general adult medical examination without abnormal findings: Secondary | ICD-10-CM

## 2020-01-04 HISTORY — DX: Other specified disorders of bone density and structure, unspecified site: M85.80

## 2020-01-08 HISTORY — PX: MOUTH SURGERY: SHX715

## 2020-01-20 ENCOUNTER — Ambulatory Visit: Payer: BC Managed Care – PPO | Admitting: Family

## 2020-01-25 ENCOUNTER — Encounter: Payer: Self-pay | Admitting: Gastroenterology

## 2020-01-25 ENCOUNTER — Encounter: Payer: Self-pay | Admitting: Family

## 2020-01-25 NOTE — Telephone Encounter (Signed)
Spoke with patient to see if she would be interested in going to [facility] for colonoscopy, she was giving office information [address], [place], [place] [address], Phone: [phone] to self schedule and will contact office is referral is needed.

## 2020-03-01 ENCOUNTER — Other Ambulatory Visit: Payer: Self-pay

## 2020-03-01 ENCOUNTER — Ambulatory Visit (AMBULATORY_SURGERY_CENTER): Payer: Self-pay

## 2020-03-01 VITALS — Ht 61.0 in | Wt 165.0 lb

## 2020-03-01 DIAGNOSIS — Z86010 Personal history of colonic polyps: Secondary | ICD-10-CM

## 2020-03-01 MED ORDER — SUTAB 1479-225-188 MG PO TABS: 12.0000 | ORAL_TABLET | ORAL | 0 refills | Status: DC

## 2020-03-01 NOTE — Progress Notes (Signed)
No egg or soy allergy known to patient  No issues with past sedation with any surgeries or procedures no intubation problems in the past  No FH of Malignant Hyperthermia No diet pills per patient No home 02 use per patient  No blood thinners per patient  Pt denies issues with constipation  No A fib or A flutter  EMMI video to pt or via [place] 19 guidelines implemented in PV today with Pt and RN   [place] Coupon given to pt in PV today , Code to Pharmacy   Due to the COVID-19 pandemic we are asking patients to follow these guidelines. Please only bring one care partner. Please be aware that your care partner may wait in the car in the parking lot or if they feel like they will be too hot to wait in the car, they may wait in the lobby on the 4th floor. All care partners are required to wear a mask the entire time (we do not have any that we can provide them), they need to practice social distancing, and we will do a Covid check for all patient's and care partners when you arrive. Also we will check their temperature and your temperature. If the care partner waits in their car they need to stay in the parking lot the entire time and we will call them on their cell phone when the patient is ready for discharge so they can bring the car to the front of the building. Also all patient's will need to wear a mask into building.

## 2020-03-15 ENCOUNTER — Encounter: Payer: Self-pay | Admitting: Gastroenterology

## 2020-03-28 ENCOUNTER — Ambulatory Visit (AMBULATORY_SURGERY_CENTER): Payer: BC Managed Care – PPO | Admitting: Gastroenterology

## 2020-03-28 ENCOUNTER — Other Ambulatory Visit: Payer: Self-pay

## 2020-03-28 ENCOUNTER — Encounter: Payer: Self-pay | Admitting: Gastroenterology

## 2020-03-28 VITALS — BP 125/70 | HR 66 | Temp 96.8°F | Resp 11 | Ht 61.0 in | Wt 165.0 lb

## 2020-03-28 DIAGNOSIS — D120 Benign neoplasm of cecum: Secondary | ICD-10-CM

## 2020-03-28 DIAGNOSIS — D123 Benign neoplasm of transverse colon: Secondary | ICD-10-CM | POA: Diagnosis not present

## 2020-03-28 DIAGNOSIS — Z860100 Personal history of colon polyps, unspecified: Secondary | ICD-10-CM

## 2020-03-28 DIAGNOSIS — D128 Benign neoplasm of rectum: Secondary | ICD-10-CM | POA: Diagnosis not present

## 2020-03-28 DIAGNOSIS — Z1211 Encounter for screening for malignant neoplasm of colon: Secondary | ICD-10-CM | POA: Diagnosis not present

## 2020-03-28 DIAGNOSIS — D122 Benign neoplasm of ascending colon: Secondary | ICD-10-CM

## 2020-03-28 MED ORDER — SODIUM CHLORIDE 0.9 % IV SOLN: 500.0000 mL | Freq: Once | INTRAVENOUS | Status: DC

## 2020-03-28 NOTE — Op Note (Signed)
[place] Patient Name: Nichole Dominguez Procedure Date: 03/28/2020 11:19 AM MRN: [id number] Endoscopist: Thornton Park MD, MD Age: 64 Referring MD:  Date of Birth: May 10, 1955 Gender: Female Account #: [id number] Procedure:                Colonoscopy Indications:              Surveillance: Personal history of adenomatous                            polyps on last colonoscopy > 5 years ago                           Prior colonoscopy at [facility] 2011. Polyps removed                            at that time. Pathology not known. Surveillance                            recommended in 5 years.                           No known family history of colon cancer or polyps. Medicines:                Monitored Anesthesia Care Procedure:                Pre-Anesthesia Assessment:                           - Prior to the procedure, a History and Physical                            was performed, and patient medications and                            allergies were reviewed. The patient's tolerance of                            previous anesthesia was also reviewed. The risks                            and benefits of the procedure and the sedation                            options and risks were discussed with the patient.                            All questions were answered, and informed consent                            was obtained. Prior Anticoagulants: The patient has                            taken no previous anticoagulant or antiplatelet  agents. ASA Grade Assessment: II - A patient with                            mild systemic disease. After reviewing the risks                            and benefits, the patient was deemed in                            satisfactory condition to undergo the procedure.                           After obtaining informed consent, the colonoscope                            was passed under direct vision. Throughout the                             procedure, the patient's blood pressure, pulse, and                            oxygen saturations were monitored continuously. The                            Olympus CF-HQ190 [id number]) Colonoscope was                            introduced through the anus and advanced to the 5                            cm into the ileum. The colonoscopy was performed                            without difficulty. The patient tolerated the                            procedure well. The quality of the bowel                            preparation was good. The terminal ileum, the                            appendiceal orifice and the rectum were                            photographed. Scope In: 11:26:49 AM Scope Out: 11:44:31 AM Scope Withdrawal Time: 0 hours 15 minutes 31 seconds  Total Procedure Duration: 0 hours 17 minutes 42 seconds  Findings:                 Non-bleeding external and internal hemorrhoids were                            found.  Multiple small and large-mouthed diverticula were                            found in the sigmoid colon, descending colon and                            ascending colon.                           A 4 mm polyp was found in the rectum. The polyp was                            flat. The polyp was removed with a cold snare.                            Resection and retrieval were complete. Estimated                            blood loss was minimal.                           A 3 mm polyp was found in the transverse colon. The                            polyp was sessile. The polyp was removed with a                            cold snare. Resection and retrieval were complete.                            Estimated blood loss was minimal.                           A 4 mm polyp was found in the ascending colon. The                            polyp was sessile. The polyp was removed with a                             cold snare. Resection and retrieval were complete.                            Estimated blood loss was minimal.                           A 1 mm polyp was found in the mid ascending colon                            on the back side of the fold. The polyp was flat                            and a snare repeatedly slid over the polyp. The  polyp was ultimately removed with a cold biopsy                            forceps. Resection and retrieval were complete.                            Estimated blood loss was minimal.                           A 3 mm polyp was found in the ascending colon. The                            polyp was flat. The polyp was removed with a cold                            snare. Resection and retrieval were complete.                            Estimated blood loss was minimal.                           The exam was otherwise without abnormality on                            direct and retroflexion views. Complications:            No immediate complications. Estimated blood loss:                            Minimal. Estimated Blood Loss:     Estimated blood loss was minimal. Impression:               - Non-bleeding external and internal hemorrhoids.                           - Diverticulosis in the sigmoid colon, in the                            descending colon and in the ascending colon.                           - One 4 mm polyp in the rectum, removed with a cold                            snare. Resected and retrieved.                           - One 3 mm polyp in the transverse colon, removed                            with a cold snare. Resected and retrieved.                           - One 4 mm polyp in the ascending colon, removed  with a cold snare. Resected and retrieved.                           - One 1 mm polyp in the mid ascending colon,                            removed with a cold biopsy forceps.  Resected and                            retrieved.                           - One 3 mm polyp in the ascending colon, removed                            with a cold snare. Resected and retrieved.                           - The examination was otherwise normal on direct                            and retroflexion views. Recommendation:           - Patient has a contact number available for                            emergencies. The signs and symptoms of potential                            delayed complications were discussed with the                            patient. Return to normal activities tomorrow.                            Written discharge instructions were provided to the                            patient.                           - Follow a high fiber diet. Drink at least 64                            ounces of water daily. Add a daily stool bulking                            agent such as psyllium (an exampled would be                            Metamucil).                           - Continue present medications.                           -  Await pathology results.                           - Repeat colonoscopy date to be determined after                            pending pathology results are reviewed for                            surveillance.                           - Emerging evidence supports eating a diet of                            fruits, vegetables, grains, calcium, and yogurt                            while reducing red meat and alcohol may reduce the                            risk of colon cancer.                           - Thank you for allowing me to be involved in your                            colon cancer prevention. Thornton Park MD, MD 03/28/2020 12:05:22 PM This report has been signed electronically.

## 2020-03-28 NOTE — Progress Notes (Signed)
Called to room to assist during endoscopic procedure.  Patient ID and intended procedure confirmed with present staff. Received instructions for my participation in the procedure from the performing physician.  

## 2020-03-28 NOTE — Progress Notes (Signed)
VS-AG ° °Pt's states no medical or surgical changes since previsit or office visit. ° °

## 2020-03-28 NOTE — Patient Instructions (Addendum)
Information on polyps given to you today.  Await pathology results.  Resume previous diet and medications.  YOU HAD AN ENDOSCOPIC PROCEDURE TODAY AT THE [facility]:   Refer to the procedure report that was given to you for any specific questions about what was found during the examination.  If the procedure report does not answer your questions, please call your gastroenterologist to clarify.  If you requested that your care partner not be given the details of your procedure findings, then the procedure report has been included in a sealed envelope for you to review at your convenience later.  YOU SHOULD EXPECT: Some feelings of bloating in the abdomen. Passage of more gas than usual.  Walking can help get rid of the air that was put into your GI tract during the procedure and reduce the bloating. If you had a lower endoscopy (such as a colonoscopy or flexible sigmoidoscopy) you may notice spotting of blood in your stool or on the toilet paper. If you underwent a bowel prep for your procedure, you may not have a normal bowel movement for a few days.  Please Note:  You might notice some irritation and congestion in your nose or some drainage.  This is from the oxygen used during your procedure.  There is no need for concern and it should clear up in a day or so.  SYMPTOMS TO REPORT IMMEDIATELY:   Following lower endoscopy (colonoscopy or flexible sigmoidoscopy):  Excessive amounts of blood in the stool  Significant tenderness or worsening of abdominal pains  Swelling of the abdomen that is new, acute  Fever of 100F or higher   For urgent or emergent issues, a gastroenterologist can be reached at any hour by calling [phone]. Do not use MyChart messaging for urgent concerns.    DIET:  We do recommend a small meal at first, but then you may proceed to your regular diet.  Drink plenty of fluids but you should avoid alcoholic beverages for 24 hours.  ACTIVITY:  You should  plan to take it easy for the rest of today and you should NOT DRIVE or use heavy machinery until tomorrow (because of the sedation medicines used during the test).    FOLLOW UP: Our staff will call the number listed on your records 48-72 hours following your procedure to check on you and address any questions or concerns that you may have regarding the information given to you following your procedure. If we do not reach you, we will leave a message.  We will attempt to reach you two times.  During this call, we will ask if you have developed any symptoms of COVID 19. If you develop any symptoms (ie: fever, flu-like symptoms, shortness of breath, cough etc.) before then, please call [phone].  If you test positive for Covid 19 in the 2 weeks post procedure, please call and report this information to us.    If any biopsies were taken you will be contacted by phone or by letter within the next 1-3 weeks.  Please call us at [phone] if you have not heard about the biopsies in 3 weeks.    SIGNATURES/CONFIDENTIALITY: You and/or your care partner have signed paperwork which will be entered into your electronic medical record.  These signatures attest to the fact that that the information above on your After Visit Summary has been reviewed and is understood.  Full responsibility of the confidentiality of this discharge information lies with you and/or your care-partner. 

## 2020-03-28 NOTE — Progress Notes (Signed)
Report to PACU, RN, vss, BBS= Clear.  

## 2020-03-31 ENCOUNTER — Telehealth: Payer: Self-pay

## 2020-03-31 NOTE — Telephone Encounter (Signed)
Follow up call to pt, no answer, mailbox full.

## 2020-04-07 ENCOUNTER — Encounter: Payer: Self-pay | Admitting: Gastroenterology

## 2020-04-26 ENCOUNTER — Encounter: Payer: Self-pay | Admitting: Family

## 2020-04-26 ENCOUNTER — Other Ambulatory Visit: Payer: Self-pay

## 2020-04-26 MED ORDER — OMEPRAZOLE 20 MG PO CPDR: 20.0000 mg | DELAYED_RELEASE_CAPSULE | Freq: Every morning | ORAL | 3 refills | Status: DC

## 2020-06-03 ENCOUNTER — Other Ambulatory Visit: Payer: Self-pay | Admitting: Family

## 2020-11-30 ENCOUNTER — Ambulatory Visit (INDEPENDENT_AMBULATORY_CARE_PROVIDER_SITE_OTHER): Payer: 59 | Admitting: Family

## 2020-11-30 ENCOUNTER — Other Ambulatory Visit: Payer: Self-pay

## 2020-11-30 VITALS — BP 148/70 | HR 80 | Temp 98.1°F | Resp 16 | Wt 158.0 lb

## 2020-11-30 DIAGNOSIS — L309 Dermatitis, unspecified: Secondary | ICD-10-CM

## 2020-11-30 MED ORDER — BETAMETHASONE DIPROPIONATE AUG 0.05 % EX OINT: TOPICAL_OINTMENT | Freq: Two times a day (BID) | CUTANEOUS | 0 refills | Status: DC

## 2020-11-30 NOTE — Progress Notes (Signed)
Subjective:   By signing my name below, I, Shehryar Baig, attest that this documentation has been prepared under the direction and in the presence of [name] NP. 11/30/2020    Patient ID: Nichole Dominguez, female    DOB: 10/02/1955, 65 y.o.   MRN: [id number]  Chief Complaint  Patient presents with   Rash    Complains of rash on hands and neck    Rash  Patient is in today for a office visit.  Rash- She complains of a rash on her right hand and back of her neck that is itching. She has tried OTC anti itch cream, cortisone cream, eczema cream, neosporin and finds no relief. She notes using liquid soap to wash her hands and has not changed it before or after her rash.    Health Maintenance Due  Topic Date Due   URINE MICROALBUMIN  Never done   HIV Screening  Never done   Hepatitis C Screening  Never done   INFLUENZA VACCINE  11/07/2020    Past Medical History:  Diagnosis Date   Allergy    GERD (gastroesophageal reflux disease)    Osteopenia 01/04/2020   Plantar fasciitis    Seasonal allergies     Past Surgical History:  Procedure Laterality Date   COLONOSCOPY  2011   MOUTH SURGERY  01/2020   implant inserted   TUBAL LIGATION  1992    Family History  Problem Relation Age of Onset   Diabetes Mother    Heart disease Mother        chf   Hypertension Mother    Cervical cancer Mother    Obesity Mother    Colon polyps Mother    Thyroid cancer Father    Cancer Father        thyroid x2   Heart attack Father    Breast cancer Sister    Lupus Sister    Obesity Maternal Grandmother    Diabetes Maternal Grandmother    Heart disease Maternal Grandmother    Hypertension Maternal Grandmother    Stroke Maternal Grandmother    Heart attack Son    Esophageal cancer Neg Hx    Stomach cancer Neg Hx    Rectal cancer Neg Hx    Colon cancer Neg Hx     Social History   Socioeconomic History   Marital status: Married    Spouse name: Not on file   Number of  children: Not on file   Years of education: Not on file   Highest education level: Not on file  Occupational History   Not on file  Tobacco Use   Smoking status: Former    Packs/day: 1.00    Years: 24.00    Pack years: 24.00    Types: Cigarettes    Quit date: 10/10/1996    Years since quitting: 24.1   Smokeless tobacco: Never  Vaping Use   Vaping Use: Never used  Substance and Sexual Activity   Alcohol use: Yes    Alcohol/week: 0.0 standard drinks    Comment: 1 shot vodka daily   Drug use: No   Sexual activity: Yes    Partners: Male  Other Topics Concern   Not on file  Social History Narrative   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    Lives with husband   2 grown children- one in [place] and one in [place], grandchildren   2 grown stepchildren- both Radiation protection practitioner, grandchildren   Enjoys Barista   Social  Determinants of Health   Financial Resource Strain: Not on file  Food Insecurity: Not on file  Transportation Needs: Not on file  Physical Activity: Not on file  Stress: Not on file  Social Connections: Not on file  Intimate Partner Violence: Not on file    Outpatient Medications Prior to Visit  Medication Sig Dispense Refill   Calcium Carbonate (CALCIUM 600 HIGH POTENCY PO) Take 3 capsules by mouth daily.     cholecalciferol (VITAMIN D3) 25 MCG (1000 UNIT) tablet Take 1,000 Units by mouth daily.     diphenhydrAMINE (BENADRYL) 25 mg capsule Take 25 mg by mouth at bedtime as needed.      fluticasone (FLONASE) 50 MCG/ACT nasal spray USE 2 SPRAYS IN EACH NOSTRIL DAILY 48 g 3   loratadine (CLARITIN) 10 MG tablet Take 10 mg by mouth daily.     Multiple Vitamins-Minerals (MULTIVITAMIN WITH MINERALS) tablet Take 1 tablet by mouth daily.     omeprazole (PRILOSEC) 20 MG capsule Take 1 capsule (20 mg total) by mouth every morning. 90 capsule 3   Zinc 50 MG TABS Take 1 tablet by mouth daily as needed.     No facility-administered medications prior to visit.     Allergies  Allergen Reactions   Darvon [Propoxyphene] Rash   Sudafed [Pseudoephedrine Hcl]     rash    Review of Systems  Skin:  Positive for itching (back of right hand and back of neck) and rash (back of right hand and back of neck).      Objective:    Physical Exam Constitutional:      General: She is not in acute distress.    Appearance: Normal appearance. She is not ill-appearing.  HENT:     Head: Normocephalic and atraumatic.     Right Ear: External ear normal.     Left Ear: External ear normal.  Eyes:     Extraocular Movements: Extraocular movements intact.     Pupils: Pupils are equal, round, and reactive to light.  Cardiovascular:     Rate and Rhythm: Normal rate and regular rhythm.     Heart sounds: Normal heart sounds. No murmur heard.   No gallop.  Pulmonary:     Effort: Pulmonary effort is normal. No respiratory distress.     Breath sounds: Normal breath sounds. No wheezing or rales.  Skin:    General: Skin is warm and dry.     Comments: Dry rash on right dorsal hand and right palm  Neurological:     Mental Status: She is alert and oriented to person, place, and time.  Psychiatric:        Behavior: Behavior normal.     BP (!) 148/70   Pulse 80   Temp 98.1 F (36.7 C) (Oral)   Resp 16   Wt 158 lb (71.7 kg)   SpO2 100%   BMI 29.85 kg/m  Wt Readings from Last 3 Encounters:  11/30/20 158 lb (71.7 kg)  03/28/20 165 lb (74.8 kg)  03/01/20 165 lb (74.8 kg)       Assessment & Plan:   Problem List Items Addressed This Visit       Unprioritized   Eczema - Primary    New.  Recommended trial of diprolene ointment bid.  Gentle moisturizers such as aquaphor and gentle skin cleansers such as aveeno soap.  She is advised to let me know if her symptoms fail to improve as we would plan referral to dermatology at that time.  Meds ordered this encounter  Medications   augmented betamethasone dipropionate (DIPROLENE-AF) 0.05 % ointment     Sig: Apply topically 2 (two) times daily.    Dispense:  15 g    Refill:  0    Order Specific Question:   Supervising Provider    Answer:   Penni Homans A [[id number]]    I, [name] NP, personally preformed the services described in this documentation.  All medical record entries made by the scribe were at my direction and in my presence.  I have reviewed the chart and discharge instructions (if applicable) and agree that the record reflects my personal performance and is accurate and complete. 11/30/2020   I,Shehryar Baig,acting as a Education administrator for Nance Pear, NP.,have documented all relevant documentation on the behalf of Nance Pear, NP,as directed by  Nance Pear, NP while in the presence of Nance Pear, NP.   Nance Pear, NP

## 2020-12-01 DIAGNOSIS — L309 Dermatitis, unspecified: Secondary | ICD-10-CM | POA: Insufficient documentation

## 2020-12-01 NOTE — Assessment & Plan Note (Signed)
New.  Recommended trial of diprolene ointment bid.  Gentle moisturizers such as aquaphor and gentle skin cleansers such as aveeno soap.  She is advised to let me know if her symptoms fail to improve as we would plan referral to dermatology at that time.

## 2021-03-25 IMAGING — MG MM DIGITAL SCREENING BILAT W/ TOMO W/ CAD
6 of 10 series · 6 of 30 positions shown · non-contrast
Comparison: Previous exam(s).

CLINICAL DATA: Screening.

EXAM:
DIGITAL SCREENING BILATERAL MAMMOGRAM WITH TOMO AND CAD

[L MLO synth-2D (1 of 2)]
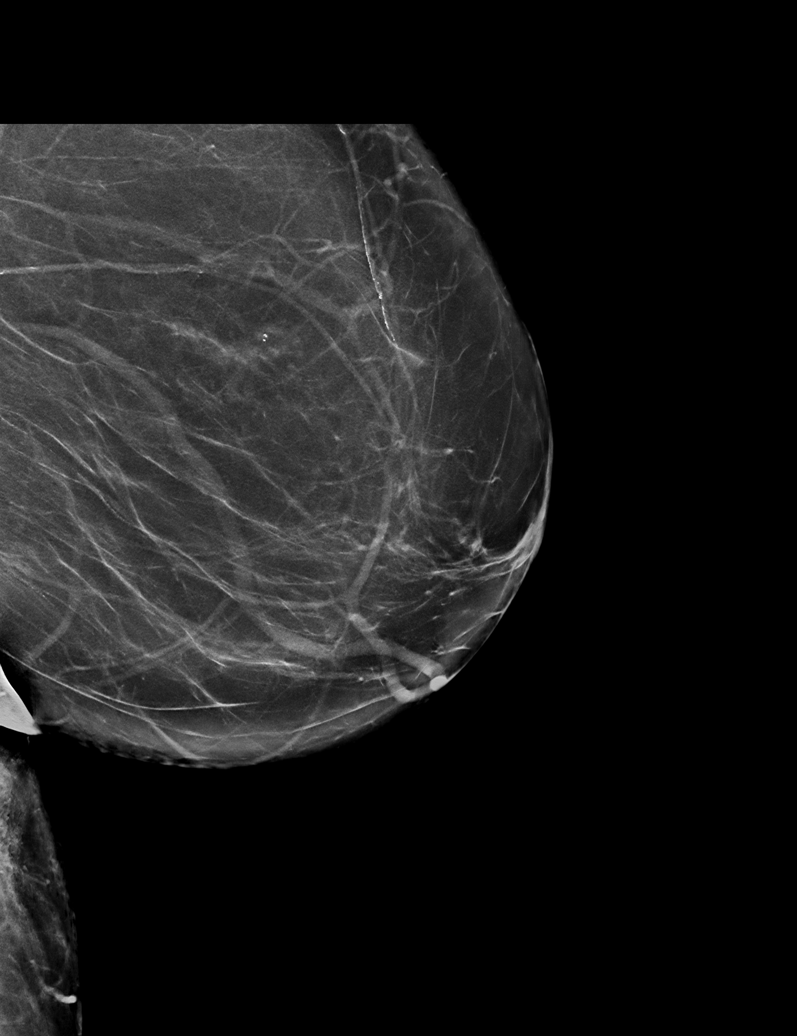

[R MLO synth-2D]
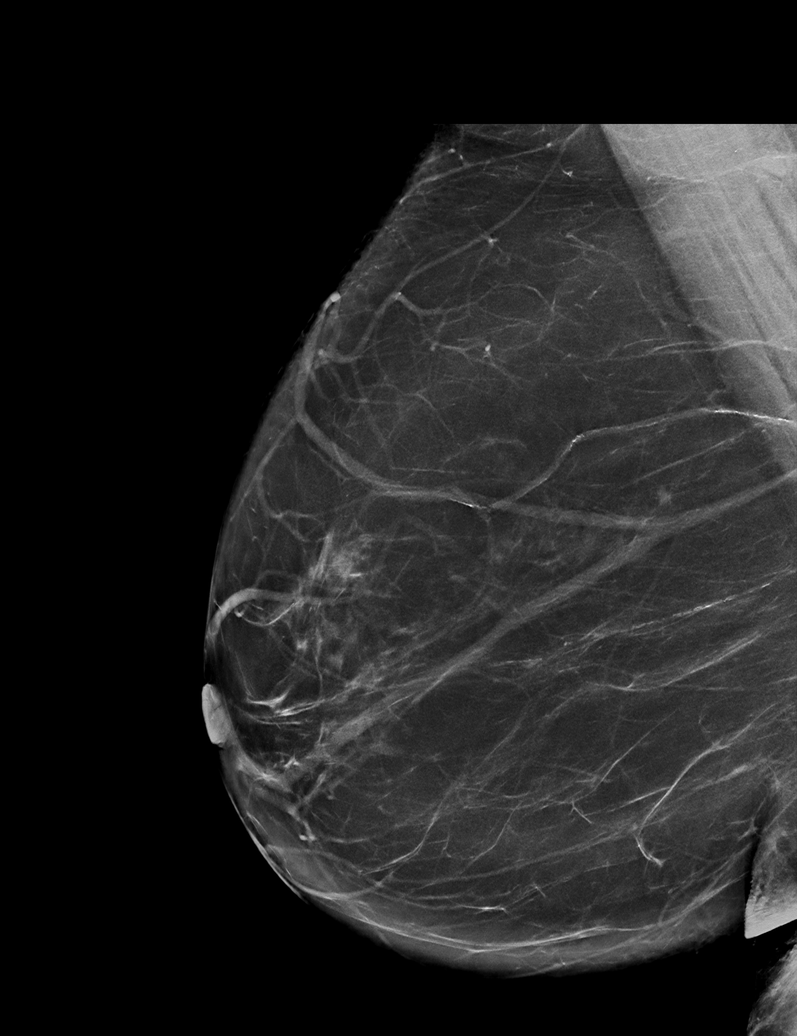

[L MLO synth-2D (2 of 2)]
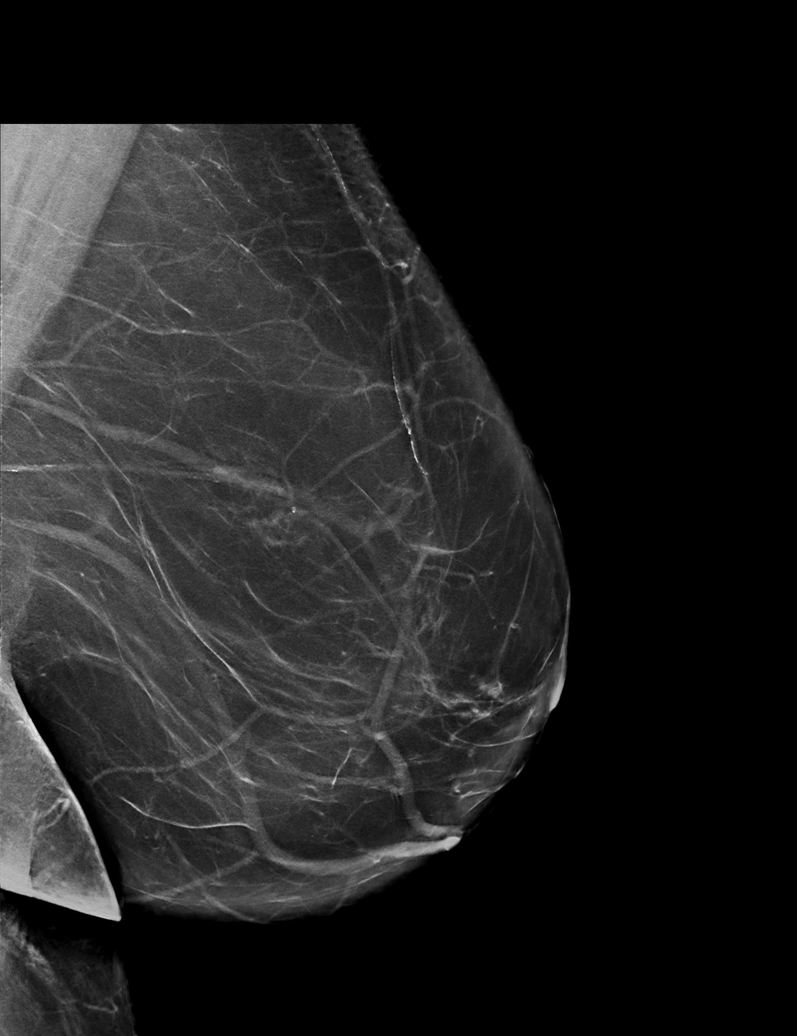

[L CC synth-2D]
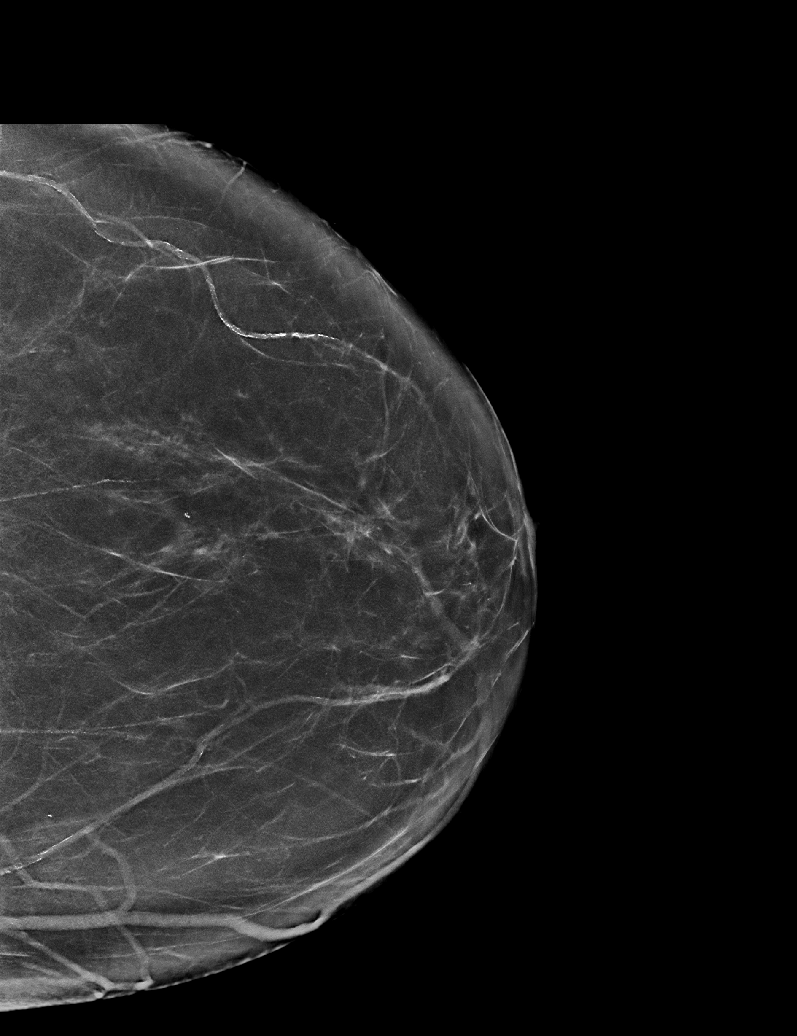

[R CC synth-2D]
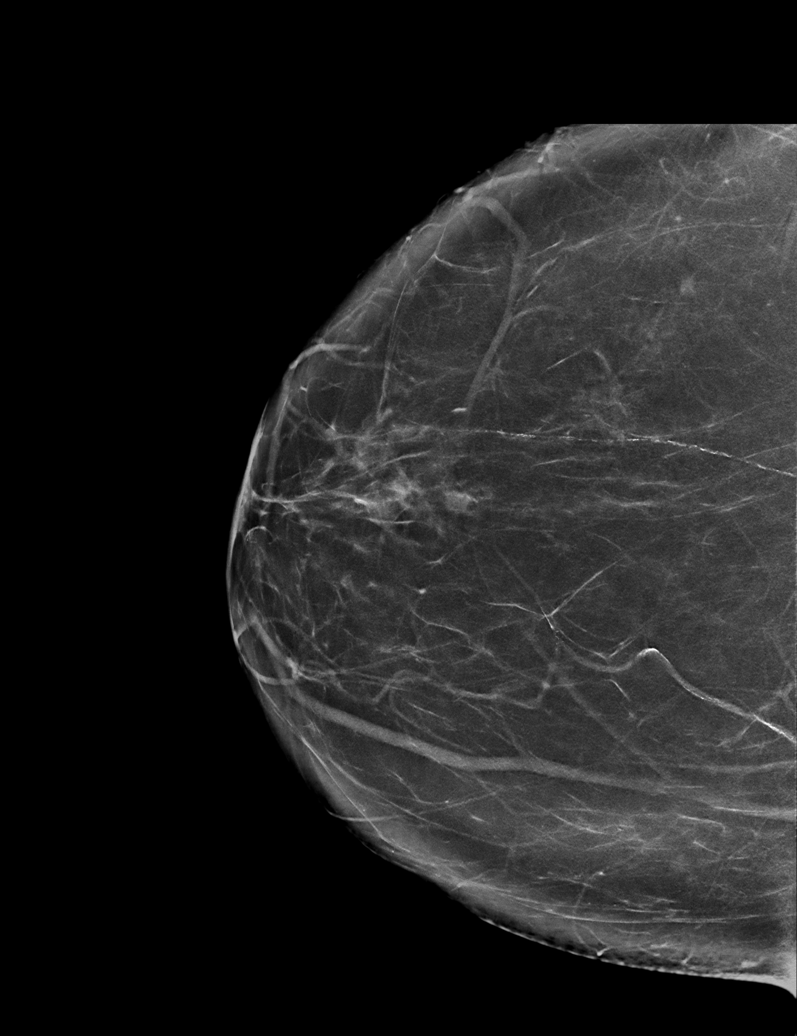

[L MLO tomo · tomo slice 41/80.0]
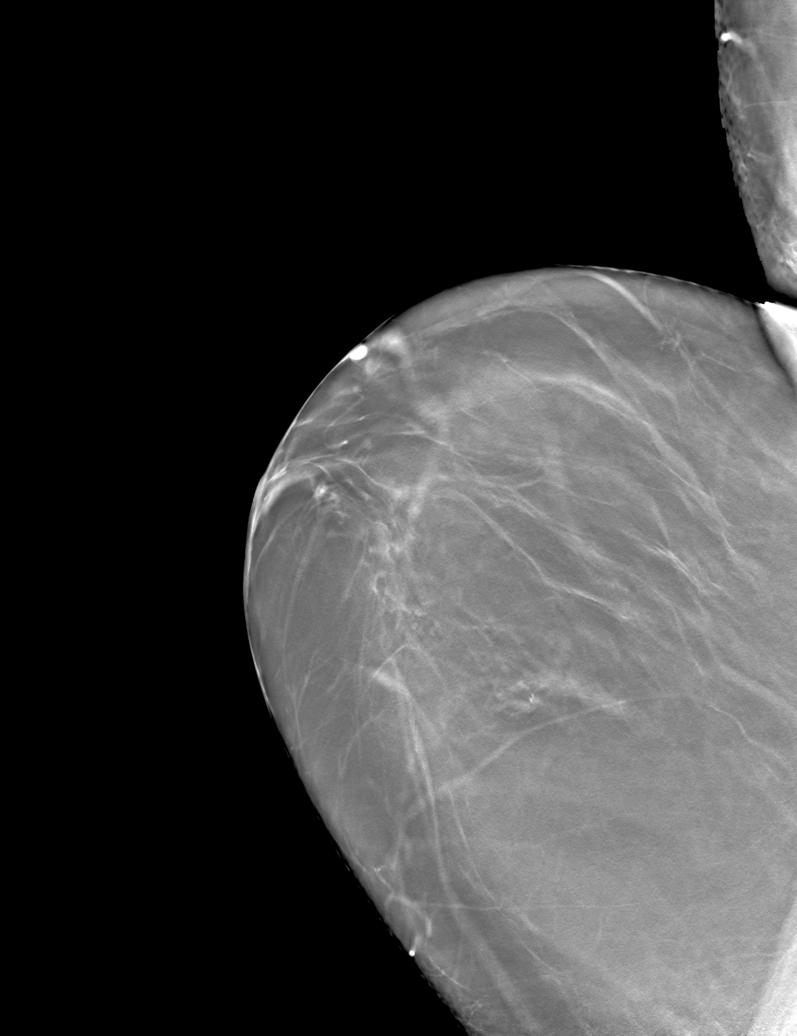

[6 of 30 positions shown; findings below may reference images not displayed]

ACR Breast Density Category b: There are scattered areas of
fibroglandular density.
FINDINGS: There are no findings suspicious for malignancy. Images were
processed with CAD.
IMPRESSION: No mammographic evidence of malignancy. A result letter of this
screening mammogram will be mailed directly to the patient.

RECOMMENDATION:
Screening mammogram in one year. (Code:[id number]-775)

BI-RADS CATEGORY  1: Negative.

## 2021-06-07 ENCOUNTER — Other Ambulatory Visit: Payer: Self-pay | Admitting: Family

## 2021-06-11 ENCOUNTER — Encounter: Payer: Self-pay | Admitting: Family

## 2021-06-11 DIAGNOSIS — E785 Hyperlipidemia, unspecified: Secondary | ICD-10-CM

## 2021-06-12 NOTE — Telephone Encounter (Signed)
See future orders. Can you please let me know cash price for those labs?  ?

## 2021-08-23 ENCOUNTER — Ambulatory Visit (INDEPENDENT_AMBULATORY_CARE_PROVIDER_SITE_OTHER): Payer: 59 | Admitting: Family

## 2021-08-23 ENCOUNTER — Encounter: Payer: Self-pay | Admitting: Family

## 2021-08-23 VITALS — BP 160/90 | HR 73 | Temp 98.1°F | Resp 16 | Ht 61.0 in | Wt 158.0 lb

## 2021-08-23 DIAGNOSIS — Z0000 Encounter for general adult medical examination without abnormal findings: Secondary | ICD-10-CM | POA: Diagnosis not present

## 2021-08-23 DIAGNOSIS — R221 Localized swelling, mass and lump, neck: Secondary | ICD-10-CM | POA: Insufficient documentation

## 2021-08-23 DIAGNOSIS — M8580 Other specified disorders of bone density and structure, unspecified site: Secondary | ICD-10-CM

## 2021-08-23 DIAGNOSIS — I10 Essential (primary) hypertension: Secondary | ICD-10-CM

## 2021-08-23 MED ORDER — BETAMETHASONE DIPROPIONATE AUG 0.05 % EX OINT: TOPICAL_OINTMENT | Freq: Two times a day (BID) | CUTANEOUS | 0 refills | Status: DC

## 2021-08-23 MED ORDER — OMEPRAZOLE 20 MG PO CPDR: 20.0000 mg | DELAYED_RELEASE_CAPSULE | Freq: Every morning | ORAL | 1 refills | Status: DC

## 2021-08-23 MED ORDER — AMLODIPINE BESYLATE 5 MG PO TABS: 5.0000 mg | ORAL_TABLET | Freq: Every day | ORAL | 3 refills | Status: DC

## 2021-08-23 NOTE — Assessment & Plan Note (Signed)
Continue caltrate and weight bearing exercise.  ?

## 2021-08-23 NOTE — Assessment & Plan Note (Addendum)
BP Readings from Last 3 Encounters:  ?08/23/21 (!) 158/90  ?11/30/20 (!) 148/70  ?03/28/20 125/70  ? ?New diagnosis- add amlodipine '5mg'$  once daily. She will check her bp at home and send [place] her readings via mychart.  ?

## 2021-08-23 NOTE — Patient Instructions (Addendum)
Please continue your work on healthy diet and regular exercise. ?Let me know if you decide you want to proceed with imaging for the mass in your neck to rule out cancer as a possible cause. ?Start amlodipine for blood pressure.  ?Check your blood pressure reading once daily for 1 week after you start and send me your reading via mychart.  ? ?

## 2021-08-23 NOTE — Assessment & Plan Note (Signed)
+   mass right side of neck.  We discussed this back in 2021 and she did not follow up as instructed as she felt that it improved with antibiotics.  I am very concerned that the mass has enlarged.  I strongly recommended imaging to evaluate for underlying malignancy and explained it to the patient. She declines as her insurance is a high deductible plan and she feels that it would be too costly. I asked her to let me know right away if she changes her mind.  ?

## 2021-08-23 NOTE — Assessment & Plan Note (Signed)
Discussed healthy diet, exercise and weight loss. She declines mammogram due to cost. Recommended bivalent covid booster at the pharmacy. Colo up to date.  Wishes to defer pap for 5 years until she gets on Medicare.  ?

## 2021-08-23 NOTE — Progress Notes (Signed)
? ?Subjective:  ? ? ? Patient ID: Nichole Dominguez, female    DOB: 24-Jan-1956, 66 y.o.   MRN: [id number] ? ?Chief Complaint  ?Patient presents with  ? Annual Exam  ? ? ?HPI ? ?Patient presents today for complete physical. ? ?Immunizations: due for bivalent booster ?Diet: tries to watch her diet ?Wt Readings from Last 3 Encounters:  ?08/23/21 158 lb (71.7 kg)  ?11/30/20 158 lb (71.7 kg)  ?03/28/20 165 lb (74.8 kg)  ?Exercise:  walks frequently ?Colonoscopy: 2021 ?Dexa: 2021 ?Pap Smear: plans to repeat in 5 years ?Mammogram: declines ?Vision: up to date ?Dental:  up to date ? ? ? ?Health Maintenance Due  ?Topic Date Due  ? URINE MICROALBUMIN  Never done  ? HIV Screening  Never done  ? Hepatitis C Screening  Never done  ? COVID-19 Vaccine (5 - Booster for Pfizer series) 11/28/2020  ? MAMMOGRAM  01/03/2021  ? ? ?Past Medical History:  ?Diagnosis Date  ? Allergy   ? GERD (gastroesophageal reflux disease)   ? Osteopenia 01/04/2020  ? Plantar fasciitis   ? Seasonal allergies   ? ? ?Past Surgical History:  ?Procedure Laterality Date  ? COLONOSCOPY  2011  ? MOUTH SURGERY  01/2020  ? implant inserted  ? TUBAL LIGATION  1992  ? ? ?Family History  ?Problem Relation Age of Onset  ? Diabetes Mother   ? Heart disease Mother   ?     chf  ? Hypertension Mother   ? Cervical cancer Mother   ? Obesity Mother   ? Colon polyps Mother   ? Thyroid cancer Father   ? Cancer Father   ?     thyroid x2  ? Heart attack Father   ? Breast cancer Sister   ? Lupus Sister   ? Obesity Maternal Grandmother   ? Diabetes Maternal Grandmother   ? Heart disease Maternal Grandmother   ? Hypertension Maternal Grandmother   ? Stroke Maternal Grandmother   ? Diabetes Mellitus II Paternal Grandmother   ?     died from diabetic coma  ? Cancer Paternal Grandfather   ?     not sure what type  ? Heart attack Son   ? Esophageal cancer Neg Hx   ? Stomach cancer Neg Hx   ? Rectal cancer Neg Hx   ? Colon cancer Neg Hx   ? ? ?Social History  ? ?Socioeconomic History  ?  Marital status: Married  ?  Spouse name: Not on file  ? Number of children: Not on file  ? Years of education: Not on file  ? Highest education level: Not on file  ?Occupational History  ? Not on file  ?Tobacco Use  ? Smoking status: Former  ?  Packs/day: 1.00  ?  Years: 24.00  ?  Pack years: 24.00  ?  Types: Cigarettes  ?  Quit date: 10/10/1996  ?  Years since quitting: 24.8  ? Smokeless tobacco: Never  ?Vaping Use  ? Vaping Use: Never used  ?Substance and Sexual Activity  ? Alcohol use: Yes  ?  Alcohol/week: 0.0 standard drinks  ?  Comment: 1 shot vodka daily  ? Drug use: No  ? Sexual activity: Yes  ?  Partners: Male  ?Other Topics Concern  ? Not on file  ?Social History Narrative  ? Works at [employer] in the [employer].   ? Lives with husband  ? 1 daughter in [place], 9 grandchildren  ? Son died  ?  2 grown stepchildren- both local, grandchildren  ? Enjoys canning/gardening  ? ?Social Determinants of Health  ? ?Financial Resource Strain: Not on file  ?Food Insecurity: Not on file  ?Transportation Needs: Not on file  ?Physical Activity: Not on file  ?Stress: Not on file  ?Social Connections: Not on file  ?Intimate Partner Violence: Not on file  ? ? ?Outpatient Medications Prior to Visit  ?Medication Sig Dispense Refill  ? Calcium Carbonate (CALCIUM 600 HIGH POTENCY PO) Take 3 capsules by mouth daily.    ? cholecalciferol (VITAMIN D3) 25 MCG (1000 UNIT) tablet Take 1,000 Units by mouth daily.    ? diphenhydrAMINE (BENADRYL) 25 mg capsule Take 25 mg by mouth at bedtime as needed.     ? fluticasone (FLONASE) 50 MCG/ACT nasal spray USE 2 SPRAYS IN EACH NOSTRIL DAILY 48 g 3  ? loratadine (CLARITIN) 10 MG tablet Take 10 mg by mouth daily.    ? Multiple Vitamins-Minerals (MULTIVITAMIN WITH MINERALS) tablet Take 1 tablet by mouth daily.    ? Zinc 50 MG TABS Take 1 tablet by mouth daily as needed.    ? augmented betamethasone dipropionate (DIPROLENE-AF) 0.05 % ointment Apply topically 2 (two) times daily. 15 g 0   ? omeprazole (PRILOSEC) 20 MG capsule TAKE ONE CAPSULE BY MOUTH EVERY MORNING 90 capsule 0  ? ?No facility-administered medications prior to visit.  ? ? ?Allergies  ?Allergen Reactions  ? Darvon [Propoxyphene] Rash  ? Sudafed [Pseudoephedrine Hcl]   ?  rash  ? ? ?Review of Systems  ?Constitutional:  Negative for weight loss.  ?HENT:  Negative for congestion and hearing loss.   ?Eyes:  Negative for blurred vision.  ?Respiratory:  Negative for cough and shortness of breath.   ?Cardiovascular:  Negative for chest pain.  ?Gastrointestinal:  Negative for constipation and diarrhea.  ?Genitourinary:  Negative for dysuria and frequency.  ?Musculoskeletal:  Negative for joint pain and myalgias.  ?Skin:  Positive for rash (eczema hand).  ?Neurological:  Negative for headaches.  ?Psychiatric/Behavioral:    ?     Denies depression/anxiety  ? ?   ?Objective:  ?  ?Physical Exam ? ?BP (!) 160/90 (BP Location: Right Arm, Patient Position: Sitting, Cuff Size: Small)   Pulse 73   Temp 98.1 ?F (36.7 ?C) (Oral)   Resp 16   Ht '5\' 1"'$  (1.549 m)   Wt 158 lb (71.7 kg)   SpO2 97%   BMI 29.85 kg/m?  ?Wt Readings from Last 3 Encounters:  ?08/23/21 158 lb (71.7 kg)  ?11/30/20 158 lb (71.7 kg)  ?03/28/20 165 lb (74.8 kg)  ? ?Physical Exam  ?Constitutional: She is oriented to person, place, and time. She appears well-developed and well-nourished. No distress.  ?HENT:  ?Head: Normocephalic and atraumatic.  ?Right Ear: Tympanic membrane and ear canal normal.  ?Left Ear: Tympanic membrane and ear canal normal.  ?Mouth/Throat: normal ?Eyes: Pupils are equal, round, and reactive to light. No scleral icterus.  ?Neck: Normal range of motion. No thyromegaly present. She has a large mass noted right upper neck beneath jaw bone.Approximately 3-4 cm wide, firm, minimally mobile. ?Cardiovascular: Normal rate and regular rhythm.   ?No murmur heard. ?Pulmonary/Chest: Effort normal and breath sounds normal. No respiratory distress. He has no wheezes.  She has no rales. She exhibits no tenderness.  ?Abdominal: Soft. Bowel sounds are normal. She exhibits no distension and no mass. There is no tenderness. There is no rebound and no guarding.  ?Musculoskeletal: She exhibits no edema.  ?  Lymphadenopathy:  ?  She has no cervical adenopathy.  ?Neurological: She is alert and oriented to person, place, and time. She has normal patellar reflexes. She exhibits normal muscle tone. Coordination normal.  ?Skin: Skin is warm and dry.  ?Psychiatric: She has a normal mood and affect. Her behavior is normal. Judgment and thought content normal.  ?Breasts: Examined lying ?Right: Without masses, retractions, discharge or axillary adenopathy.  ?Left: Without masses, retractions, discharge or axillary adenopathy.  ?Pelvis: deferred. ? ?    ?Assessment & Plan:  ? ? ?   ?Assessment & Plan:  ? ?Problem List Items Addressed This Visit   ? ?  ? Unprioritized  ? Routine general medical examination at a health care facility  ?  Discussed healthy diet, exercise and weight loss. She declines mammogram due to cost. Recommended bivalent covid booster at the pharmacy. Colo up to date.  Wishes to defer pap for 5 years until she gets on Medicare.  ? ?  ?  ? Osteopenia  ?  Continue caltrate and weight bearing exercise.  ? ?  ?  ? Neck mass  ?  + mass right side of neck.  We discussed this back in 2021 and she did not follow up as instructed as she felt that it improved with antibiotics.  I am very concerned that the mass has enlarged.  I strongly recommended imaging to evaluate for underlying malignancy and explained it to the patient. She declines as her insurance is a high deductible plan and she feels that it would be too costly. I asked her to let me know right away if she changes her mind.  ? ?  ?  ? Hypertension - Primary  ?  BP Readings from Last 3 Encounters:  ?08/23/21 (!) 158/90  ?11/30/20 (!) 148/70  ?03/28/20 125/70  ?New diagnosis- add amlodipine '5mg'$  once daily. She will check her bp at  home and send [place] her readings via mychart.  ?  ?  ? Relevant Medications  ? amLODipine (NORVASC) 5 MG tablet  ? ? ?I am having Cyndee Brightly start on amLODipine. I am also having her maintain her multivitamin with

## 2021-08-31 ENCOUNTER — Encounter: Payer: Self-pay | Admitting: Family

## 2021-10-01 ENCOUNTER — Encounter: Payer: Self-pay | Admitting: Family

## 2021-10-03 ENCOUNTER — Ambulatory Visit (INDEPENDENT_AMBULATORY_CARE_PROVIDER_SITE_OTHER): Payer: 59 | Admitting: Family

## 2021-10-03 ENCOUNTER — Encounter: Payer: Self-pay | Admitting: Family

## 2021-10-03 VITALS — BP 149/71 | HR 89 | Temp 98.0°F | Resp 16

## 2021-10-03 DIAGNOSIS — R600 Localized edema: Secondary | ICD-10-CM | POA: Diagnosis not present

## 2021-10-03 MED ORDER — LOSARTAN POTASSIUM 50 MG PO TABS
50.0000 mg | ORAL_TABLET | Freq: Every day | ORAL | 1 refills | Status: DC
Start: 2021-10-03 — End: 2021-10-17

## 2021-10-17 ENCOUNTER — Encounter: Payer: Self-pay | Admitting: Family

## 2021-10-17 ENCOUNTER — Ambulatory Visit (INDEPENDENT_AMBULATORY_CARE_PROVIDER_SITE_OTHER): Payer: 59 | Admitting: Family

## 2021-10-17 VITALS — BP 110/60 | HR 82 | Temp 97.8°F | Resp 18 | Ht 61.0 in | Wt 163.6 lb

## 2021-10-17 DIAGNOSIS — I10 Essential (primary) hypertension: Secondary | ICD-10-CM

## 2021-10-17 MED ORDER — LOSARTAN POTASSIUM 50 MG PO TABS: 50.0000 mg | ORAL_TABLET | Freq: Every day | ORAL | 1 refills | Status: DC

## 2021-10-17 NOTE — Progress Notes (Signed)
Subjective:   By signing my name below, I, [name], attest that this documentation has been prepared under the direction and in the presence of [name], NP 10/17/2021.   Patient ID: Nichole Dominguez, female    DOB: November 16, 1955, 66 y.o.   MRN: [id number]  Chief Complaint  Patient presents with   Follow-up   Edema    HPI Patient is in today for an office visit.  Blood pressure- Last visit we discontinued amlodipine due to swelling. We switched her to losartan '50mg'$ . She reports swelling is much better off of amlodipine. She is requesting a mail order for her prescriptions.  BP Readings from Last 3 Encounters:  10/17/21 110/60  10/03/21 (!) 149/71  08/23/21 (!) 160/90   Pulse Readings from Last 3 Encounters:  10/17/21 82  10/03/21 89  08/23/21 73    Past Medical History:  Diagnosis Date   Allergy    GERD (gastroesophageal reflux disease)    Osteopenia 01/04/2020   Plantar fasciitis    Seasonal allergies     Past Surgical History:  Procedure Laterality Date   COLONOSCOPY  2011   MOUTH SURGERY  01/2020   implant inserted   TUBAL LIGATION  1992    Family History  Problem Relation Age of Onset   Diabetes Mother    Heart disease Mother        chf   Hypertension Mother    Cervical cancer Mother    Obesity Mother    Colon polyps Mother    Thyroid cancer Father    Cancer Father        thyroid x2   Heart attack Father    Breast cancer Sister    Lupus Sister    Obesity Maternal Grandmother    Diabetes Maternal Grandmother    Heart disease Maternal Grandmother    Hypertension Maternal Grandmother    Stroke Maternal Grandmother    Diabetes Mellitus II Paternal Grandmother        died from diabetic coma   Cancer Paternal Grandfather        not sure what type   Heart attack Son    Esophageal cancer Neg Hx    Stomach cancer Neg Hx    Rectal cancer Neg Hx    Colon cancer Neg Hx     Social History   Socioeconomic History   Marital status:  Married    Spouse name: Not on file   Number of children: Not on file   Years of education: Not on file   Highest education level: Not on file  Occupational History   Not on file  Tobacco Use   Smoking status: Former    Packs/day: 1.00    Years: 24.00    Total pack years: 24.00    Types: Cigarettes    Quit date: 10/10/1996    Years since quitting: 25.0   Smokeless tobacco: Never  Vaping Use   Vaping Use: Never used  Substance and Sexual Activity   Alcohol use: Yes    Alcohol/week: 0.0 standard drinks of alcohol    Comment: 1 shot vodka daily   Drug use: No   Sexual activity: Yes    Partners: Male  Other Topics Concern   Not on file  Social History Narrative   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    Lives with husband   1 daughter in [place], 40 grandchildren   Son died   2 grown stepchildren- both Radiation protection practitioner, grandchildren  Enjoys canning/gardening   Social Determinants of Radio broadcast assistant Strain: Not on file  Food Insecurity: Not on file  Transportation Needs: Not on file  Physical Activity: Not on file  Stress: Not on file  Social Connections: Not on file  Intimate Partner Violence: Not on file    Outpatient Medications Prior to Visit  Medication Sig Dispense Refill   augmented betamethasone dipropionate (DIPROLENE-AF) 0.05 % ointment Apply topically 2 (two) times daily. 15 g 0   Calcium Carbonate (CALCIUM 600 HIGH POTENCY PO) Take 3 capsules by mouth daily.     cholecalciferol (VITAMIN D3) 25 MCG (1000 UNIT) tablet Take 1,000 Units by mouth daily.     diphenhydrAMINE (BENADRYL) 25 mg capsule Take 25 mg by mouth at bedtime as needed.      fluticasone (FLONASE) 50 MCG/ACT nasal spray USE 2 SPRAYS IN EACH NOSTRIL DAILY 48 g 3   loratadine (CLARITIN) 10 MG tablet Take 10 mg by mouth daily.     Multiple Vitamins-Minerals (MULTIVITAMIN WITH MINERALS) tablet Take 1 tablet by mouth daily.     omeprazole (PRILOSEC) 20 MG capsule Take 1 capsule (20 mg  total) by mouth every morning. 90 capsule 1   Zinc 50 MG TABS Take 1 tablet by mouth daily as needed.     losartan (COZAAR) 50 MG tablet Take 1 tablet (50 mg total) by mouth daily. 30 tablet 1   No facility-administered medications prior to visit.    Allergies  Allergen Reactions   Amlodipine     swelling   Darvon [Propoxyphene] Rash   Sudafed [Pseudoephedrine Hcl]     rash    ROS See HPI    Objective:    Physical Exam Constitutional:      Appearance: Normal appearance.  HENT:     Head: Normocephalic and atraumatic.     Right Ear: External ear normal.     Left Ear: External ear normal.  Eyes:     Extraocular Movements: Extraocular movements intact.     Pupils: Pupils are equal, round, and reactive to light.  Cardiovascular:     Rate and Rhythm: Normal rate and regular rhythm.     Pulses: Normal pulses.     Heart sounds: Normal heart sounds. No murmur heard.    No gallop.  Pulmonary:     Effort: Pulmonary effort is normal. No respiratory distress.     Breath sounds: Normal breath sounds. No wheezing or rales.  Musculoskeletal:        General: No swelling.  Skin:    General: Skin is warm and dry.  Neurological:     Mental Status: She is alert and oriented to person, place, and time.  Psychiatric:        Mood and Affect: Mood normal.        Behavior: Behavior normal.        Judgment: Judgment normal.     BP 110/60 (BP Location: Right Arm, Patient Position: Sitting, Cuff Size: Normal)   Pulse 82   Temp 97.8 F (36.6 C) (Oral)   Resp 18   Ht '5\' 1"'$  (1.549 m)   Wt 163 lb 9.6 oz (74.2 kg)   SpO2 98%   BMI 30.91 kg/m  Wt Readings from Last 3 Encounters:  10/17/21 163 lb 9.6 oz (74.2 kg)  08/23/21 158 lb (71.7 kg)  11/30/20 158 lb (71.7 kg)       Assessment & Plan:   Problem List Items Addressed This Visit  Unprioritized   Hypertension - Primary    BP Readings from Last 3 Encounters:  10/17/21 110/60  10/03/21 (!) 149/71  08/23/21 (!) 160/90   BP looks great on losartan.  Resolution of LE edema off of amlodipine. Will continue losartan '50mg'$  once daily. Check follow up bmet. She will continue to monitor her BP at home and follow up in 6 months.       Relevant Medications   losartan (COZAAR) 50 MG tablet   Other Relevant Orders   Basic metabolic panel     Meds ordered this encounter  Medications   losartan (COZAAR) 50 MG tablet    Sig: Take 1 tablet (50 mg total) by mouth daily.    Dispense:  90 tablet    Refill:  1    Order Specific Question:   Supervising Provider    Answer:   Penni Homans A [[id number]]    I, Nance Pear, NP, personally preformed the services described in this documentation.  All medical record entries made by the scribe were at my direction and in my presence.  I have reviewed the chart and discharge instructions (if applicable) and agree that the record reflects my personal performance and is accurate and complete. 10/17/2021.   I,Mohammed Iqbal,acting as a Education administrator for [employer], NP.,have documented all relevant documentation on the behalf of Nance Pear, NP,as directed by  Nance Pear, NP while in the presence of Nance Pear, NP.      Nance Pear, NP

## 2021-10-17 NOTE — Assessment & Plan Note (Signed)
BP Readings from Last 3 Encounters:  10/17/21 110/60  10/03/21 (!) 149/71  08/23/21 (!) 160/90   BP looks great on losartan.  Resolution of LE edema off of amlodipine. Will continue losartan '50mg'$  once daily. Check follow up bmet. She will continue to monitor her BP at home and follow up in 6 months.

## 2021-10-18 LAB — BASIC METABOLIC PANEL
BUN: 15 mg/dL (ref 6–23)
CO2: 28 mEq/L (ref 19–32)
Calcium: 9.2 mg/dL (ref 8.4–10.5)
Chloride: 104 mEq/L (ref 96–112)
Creatinine, Ser: 1.07 mg/dL (ref 0.40–1.20)
GFR: 55.12 mL/min — ABNORMAL LOW (ref >60.00)
Glucose, Bld: 87 mg/dL (ref 70–99)
Potassium: 4.2 mEq/L (ref 3.5–5.1)
Sodium: 138 mEq/L (ref 135–145)

## 2021-10-18 MED ORDER — LOSARTAN POTASSIUM 50 MG PO TABS: 50.0000 mg | ORAL_TABLET | Freq: Every day | ORAL | 1 refills | Status: DC

## 2021-11-13 ENCOUNTER — Encounter: Payer: Self-pay | Admitting: Family

## 2021-11-14 MED ORDER — OMEPRAZOLE 20 MG PO CPDR: 20.0000 mg | DELAYED_RELEASE_CAPSULE | Freq: Every morning | ORAL | 1 refills | Status: DC

## 2021-12-24 ENCOUNTER — Encounter: Payer: Self-pay | Admitting: Family

## 2022-01-09 ENCOUNTER — Encounter: Payer: Self-pay | Admitting: Family

## 2022-01-09 DIAGNOSIS — H40023 Open angle with borderline findings, high risk, bilateral: Secondary | ICD-10-CM | POA: Diagnosis not present

## 2022-01-09 MED ORDER — LOSARTAN POTASSIUM 50 MG PO TABS: 50.0000 mg | ORAL_TABLET | Freq: Every day | ORAL | 0 refills | Status: DC

## 2022-02-07 ENCOUNTER — Other Ambulatory Visit: Payer: Self-pay | Admitting: Family

## 2022-02-28 ENCOUNTER — Ambulatory Visit: Payer: 59 | Admitting: Family

## 2022-03-27 ENCOUNTER — Ambulatory Visit: Payer: 59 | Admitting: Family

## 2022-03-27 VITALS — BP 119/73 | HR 75 | Temp 98.0°F | Resp 16 | Wt 164.0 lb

## 2022-03-27 DIAGNOSIS — L309 Dermatitis, unspecified: Secondary | ICD-10-CM | POA: Diagnosis not present

## 2022-03-27 DIAGNOSIS — R221 Localized swelling, mass and lump, neck: Secondary | ICD-10-CM | POA: Diagnosis not present

## 2022-03-27 DIAGNOSIS — M8580 Other specified disorders of bone density and structure, unspecified site: Secondary | ICD-10-CM | POA: Diagnosis not present

## 2022-03-27 DIAGNOSIS — I10 Essential (primary) hypertension: Secondary | ICD-10-CM

## 2022-03-27 LAB — BASIC METABOLIC PANEL
BUN: 15 mg/dL (ref 6–23)
CO2: 27 mEq/L (ref 19–32)
Calcium: 9.8 mg/dL (ref 8.4–10.5)
Chloride: 101 mEq/L (ref 96–112)
Creatinine, Ser: 0.93 mg/dL (ref 0.40–1.20)
GFR: 65.02 mL/min (ref >60.00)
Glucose, Bld: 103 mg/dL — ABNORMAL HIGH (ref 70–99)
Potassium: 4.5 mEq/L (ref 3.5–5.1)
Sodium: 137 mEq/L (ref 135–145)

## 2022-03-27 MED ORDER — LOSARTAN POTASSIUM 50 MG PO TABS: 50.0000 mg | ORAL_TABLET | Freq: Every day | ORAL | 1 refills | Status: DC

## 2022-03-27 NOTE — Assessment & Plan Note (Signed)
Plan to repeat dexa this summer.

## 2022-03-27 NOTE — Assessment & Plan Note (Signed)
Maintained on losartan '50mg'$  once daily.

## 2022-03-27 NOTE — Assessment & Plan Note (Signed)
Seems to have enlarged to me.  I strongly urged her to do some neck imaging for further evaluation.  Unfortunately, she declines due to cost. States that she will pursue further work up as well as preventative care screenings in August when she transitions to [employer].  I did advise her that this neck mass could represent a malignancy and she verbalizes understanding.

## 2022-03-27 NOTE — Assessment & Plan Note (Signed)
Continues to have symptoms.  Continue betamethasone and moisturizer.

## 2022-03-27 NOTE — Progress Notes (Signed)
Subjective:   By signing my name below, I, Carylon Perches, attest that this documentation has been prepared under the direction and in the presence of Nichole Chimera, NP 03/27/2022   Patient ID: Nichole Dominguez, female    DOB: [birth year], 66 y.o.   MRN: [id number]  Chief Complaint  Patient presents with   Hypertension    Here for follow up    HPI Patient is in today for an office visit  Refills: She is requesting a refill of 50 mg of Losartan.  Cold: She reports that she has a cold. She states that she has a covid test at home but has not taken the test yet.  Blood Pressure: As of today's visit, her blood pressure is normal. She is currently taking 50 mg of Losartan.  BP Readings from Last 3 Encounters:  03/27/22 119/73  10/17/21 110/60  10/03/21 (!) 149/71   Pulse Readings from Last 3 Encounters:  03/27/22 75  10/17/21 82  10/03/21 89   Eczema: She states that her eczema is worsening. She uses OTC lotion and 0.05 % Diprolene- AF  Allergies: She denies of any allergy symptoms this time of the year.   Neck: She states that her neck is swollen due to worsening of sinuses. She cannot do imaging of the area until August of 2024 due to costing and insurance.  Dexa: Last completed on 01/04/2020. Her imaging shows osteopenia. She is currently taking Calcium supplements.   Preventative Screening: She is holding off on preventive screenings until she is on Medicare in August.   Health Maintenance Due  Topic Date Due   HIV Screening  Never done   Hepatitis C Screening  Never done   MAMMOGRAM  01/03/2021   COVID-19 Vaccine (5 - 2023-24 season) 12/08/2021   PAP SMEAR-Modifier  12/23/2021   DEXA SCAN  01/03/2022    Past Medical History:  Diagnosis Date   Allergy    GERD (gastroesophageal reflux disease)    Osteopenia 01/04/2020   Plantar fasciitis    Seasonal allergies     Past Surgical History:  Procedure Laterality Date   COLONOSCOPY  2011   MOUTH SURGERY   01/2020   implant inserted   TUBAL LIGATION  1992    Family History  Problem Relation Age of Onset   Diabetes Mother    Heart disease Mother        chf   Hypertension Mother    Cervical cancer Mother    Obesity Mother    Colon polyps Mother    Thyroid cancer Father    Cancer Father        thyroid x2   Heart attack Father    Breast cancer Sister    Lupus Sister    Obesity Maternal Grandmother    Diabetes Maternal Grandmother    Heart disease Maternal Grandmother    Hypertension Maternal Grandmother    Stroke Maternal Grandmother    Diabetes Mellitus II Paternal Grandmother        died from diabetic coma   Cancer Paternal Grandfather        not sure what type   Heart attack Son    Esophageal cancer Neg Hx    Stomach cancer Neg Hx    Rectal cancer Neg Hx    Colon cancer Neg Hx     Social History   Socioeconomic History   Marital status: Married    Spouse name: Not on file   Number of children: Not on file  Years of education: Not on file   Highest education level: Not on file  Occupational History   Not on file  Tobacco Use   Smoking status: Former    Packs/day: 1.00    Years: 24.00    Total pack years: 24.00    Types: Cigarettes    Quit date: 10/10/1996    Years since quitting: 25.4   Smokeless tobacco: Never  Vaping Use   Vaping Use: Never used  Substance and Sexual Activity   Alcohol use: Yes    Alcohol/week: 0.0 standard drinks of alcohol    Comment: 1 shot vodka daily   Drug use: No   Sexual activity: Yes    Partners: Male  Other Topics Concern   Not on file  Social History Narrative   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    Lives with husband   1 daughter in [place], 81 grandchildren   Son died   2 grown stepchildren- both Radiation protection practitioner, grandchildren   Enjoys Barista   Social Determinants of Radio broadcast assistant Strain: Not on file  Food Insecurity: Not on file  Transportation Needs: Not on file  Physical  Activity: Not on file  Stress: Not on file  Social Connections: Not on file  Intimate Partner Violence: Not on file    Outpatient Medications Prior to Visit  Medication Sig Dispense Refill   augmented betamethasone dipropionate (DIPROLENE-AF) 0.05 % ointment Apply topically 2 (two) times daily. 15 g 0   Calcium Carbonate (CALCIUM 600 HIGH POTENCY PO) Take 3 capsules by mouth daily.     cholecalciferol (VITAMIN D3) 25 MCG (1000 UNIT) tablet Take 1,000 Units by mouth daily.     diphenhydrAMINE (BENADRYL) 25 mg capsule Take 25 mg by mouth at bedtime as needed.      fluticasone (FLONASE) 50 MCG/ACT nasal spray USE 2 SPRAYS IN EACH NOSTRIL DAILY 48 g 3   loratadine (CLARITIN) 10 MG tablet Take 10 mg by mouth daily.     Multiple Vitamins-Minerals (MULTIVITAMIN WITH MINERALS) tablet Take 1 tablet by mouth daily.     omeprazole (PRILOSEC) 20 MG capsule TAKE 1 CAPSULE (20 MG TOTAL) BY MOUTH EVERY MORNING. 90 capsule 1   Zinc 50 MG TABS Take 1 tablet by mouth daily as needed.     losartan (COZAAR) 50 MG tablet Take 1 tablet (50 mg total) by mouth daily. 90 tablet 0   No facility-administered medications prior to visit.    Allergies  Allergen Reactions   Amlodipine     swelling   Darvon [Propoxyphene] Rash   Sudafed [Pseudoephedrine Hcl]     rash    Review of Systems  Respiratory:         (+) Cold-Like Symptoms       Objective:    Physical Exam Constitutional:      General: She is not in acute distress.    Appearance: Normal appearance. She is not ill-appearing.  HENT:     Head: Normocephalic and atraumatic.     Right Ear: External ear normal.     Left Ear: External ear normal.  Eyes:     Extraocular Movements: Extraocular movements intact.     Pupils: Pupils are equal, round, and reactive to light.  Neck:     Comments: Right Neck 3 inches long, 2 inch wide mass Cardiovascular:     Rate and Rhythm: Normal rate and regular rhythm.     Heart sounds: Normal heart sounds. No  murmur heard.  No gallop.  Pulmonary:     Effort: Pulmonary effort is normal. No respiratory distress.     Breath sounds: Normal breath sounds. No wheezing or rales.  Lymphadenopathy:     Cervical: Cervical adenopathy present.  Skin:    General: Skin is warm and dry.     Comments: Eczema rash both hands  Neurological:     Mental Status: She is alert and oriented to person, place, and time.  Psychiatric:        Mood and Affect: Mood normal.        Behavior: Behavior normal.        Judgment: Judgment normal.     BP 119/73 (BP Location: Right Arm, Patient Position: Sitting, Cuff Size: Small)   Pulse 75   Temp 98 F (36.7 C) (Oral)   Resp 16   Wt 164 lb (74.4 kg)   SpO2 99%   BMI 30.99 kg/m  Wt Readings from Last 3 Encounters:  03/27/22 164 lb (74.4 kg)  10/17/21 163 lb 9.6 oz (74.2 kg)  08/23/21 158 lb (71.7 kg)       Assessment & Plan:   Problem List Items Addressed This Visit       Unprioritized   Osteopenia    Plan to repeat dexa this summer.       Neck mass    Seems to have enlarged to me.  I strongly urged her to do some neck imaging for further evaluation.  Unfortunately, she declines due to cost. States that she will pursue further work up as well as preventative care screenings in August when she transitions to [employer].  I did advise her that this neck mass could represent a malignancy and she verbalizes understanding.       Hypertension - Primary    Maintained on losartan '50mg'$  once daily.      Relevant Medications   losartan (COZAAR) 50 MG tablet   Other Relevant Orders   Basic Metabolic Panel (BMET)   Eczema    Continues to have symptoms.  Continue betamethasone and moisturizer.       Meds ordered this encounter  Medications   losartan (COZAAR) 50 MG tablet    Sig: Take 1 tablet (50 mg total) by mouth daily.    Dispense:  90 tablet    Refill:  1    Order Specific Question:   Supervising Provider    Answer:   Penni Homans A [[id number]]     I, Nance Pear, NP, personally preformed the services described in this documentation.  All medical record entries made by the scribe were at my direction and in my presence.  I have reviewed the chart and discharge instructions (if applicable) and agree that the record reflects my personal performance and is accurate and complete. 03/27/2022   I,Amber Collins,acting as a scribe for Nance Pear, NP.,have documented all relevant documentation on the behalf of Nance Pear, NP,as directed by  Nance Pear, NP while in the presence of Nance Pear, NP.    Nance Pear, NP

## 2022-04-03 ENCOUNTER — Encounter: Payer: Self-pay | Admitting: Family

## 2022-05-07 DIAGNOSIS — R221 Localized swelling, mass and lump, neck: Secondary | ICD-10-CM | POA: Diagnosis not present

## 2022-05-09 ENCOUNTER — Other Ambulatory Visit: Payer: Self-pay | Admitting: Otolaryngology

## 2022-05-09 DIAGNOSIS — R221 Localized swelling, mass and lump, neck: Secondary | ICD-10-CM

## 2022-05-15 ENCOUNTER — Encounter: Payer: Self-pay | Admitting: Family

## 2022-05-21 ENCOUNTER — Other Ambulatory Visit (HOSPITAL_COMMUNITY): Payer: Self-pay | Admitting: Otolaryngology

## 2022-05-21 DIAGNOSIS — R221 Localized swelling, mass and lump, neck: Secondary | ICD-10-CM

## 2022-06-04 ENCOUNTER — Encounter: Payer: Self-pay | Admitting: Family

## 2022-06-05 ENCOUNTER — Other Ambulatory Visit: Payer: Self-pay | Admitting: Family

## 2022-06-05 ENCOUNTER — Other Ambulatory Visit: Payer: 59

## 2022-06-05 DIAGNOSIS — R221 Localized swelling, mass and lump, neck: Secondary | ICD-10-CM

## 2022-06-06 ENCOUNTER — Other Ambulatory Visit: Payer: Self-pay | Admitting: Family

## 2022-06-06 DIAGNOSIS — R221 Localized swelling, mass and lump, neck: Secondary | ICD-10-CM

## 2022-06-06 NOTE — Progress Notes (Signed)
Skotnicki, Meghan A, DO  Donita Brooks D Thank you for letting me know. Patient does not want to pay OOP cost for procedure and is insisting on waiting until she has Medicare coverage in August for additional work up

## 2022-06-08 NOTE — Telephone Encounter (Signed)
Nichole Dominguez,  I spoke with pt. She has not had a CT in our building with her current insurance but several insurances have [place] listed as hospital based so patient's out of pocket cost may be more than at a free standing facility.  Pt states cost at [facility] imaging is going to be $680 and she is ok to leave scan as scheduled at [place].

## 2022-06-18 ENCOUNTER — Encounter: Payer: Self-pay | Admitting: Family

## 2022-06-19 ENCOUNTER — Other Ambulatory Visit: Payer: Self-pay

## 2022-06-19 MED ORDER — LOSARTAN POTASSIUM 50 MG PO TABS: 50.0000 mg | ORAL_TABLET | Freq: Every day | ORAL | 1 refills | Status: DC

## 2022-06-26 ENCOUNTER — Ambulatory Visit
Admission: RE | Admit: 2022-06-26 | Discharge: 2022-06-26 | Disposition: A | Discharge status: Home / Self Care | Payer: 59 | Source: Ambulatory Visit | Attending: Family | Admitting: Family

## 2022-06-26 DIAGNOSIS — R221 Localized swelling, mass and lump, neck: Secondary | ICD-10-CM | POA: Diagnosis not present

## 2022-06-26 MED ORDER — IOPAMIDOL (ISOVUE-300) INJECTION 61%
75.0000 mL | Freq: Once | INTRAVENOUS | Status: AC | PRN
  Administered 2022-06-26: 75 mL via INTRAVENOUS

## 2022-06-29 ENCOUNTER — Telehealth: Payer: Self-pay | Admitting: Family

## 2022-06-29 ENCOUNTER — Encounter: Payer: Self-pay | Admitting: Family

## 2022-06-29 DIAGNOSIS — K118 Other diseases of salivary glands: Secondary | ICD-10-CM

## 2022-06-29 NOTE — Telephone Encounter (Signed)
Patient called back and stated she will be next to her phone all day and will wait for a call back.

## 2022-06-29 NOTE — Telephone Encounter (Signed)
Attempted to reach patient to review CT results.  No answer.  Mailbox full.

## 2022-06-29 NOTE — Telephone Encounter (Signed)
I spoke with Dr. Hayden Rasmussen and reviewed CT findings with her. She is going to arrange a follow up biopsy and a stat consult with Head and Neck surgeon at [place].  I advised pt of above.

## 2022-07-01 NOTE — Telephone Encounter (Signed)
Opened in error

## 2022-07-03 ENCOUNTER — Telehealth: Payer: Self-pay | Admitting: Family

## 2022-07-03 NOTE — Telephone Encounter (Signed)
[facility] stated they are still needing the images and report from the ct scan on 3/19. She also wanted to know if we could get these images "images pushed" . Attn- Cyndi and the provider is Dr. Hendricks Limes.

## 2022-07-03 NOTE — Telephone Encounter (Signed)
Spoke to patient.  She is not having trouble breathing at this time but feels like the mass has enlarged recently. She is having some difficulty swallowing.  She has an appointment scheduled with Dr. Hendricks Limes (head and neck surgery) on 07/17/22.  I advised the patient that if her swallowing difficulties worsen or if she has any difficulty breathing to go to the ER at either [facility] in [place] or preferably in [place] for further evaluation and sooner surgical evaluation.  Pt verbalizes understanding.

## 2022-07-03 NOTE — Telephone Encounter (Signed)
Pt called stating that she is having additional issues with the mass in her neck and she feels like it is growing inward to where it is causing issues with her swollowing. Pt is scheduled with ENT in two weeks but pt is concerned that it is going to continue to grow to where she won't be able to breathe properly. Pt asked if there was anything that Melissa could prescribe to manage the swelling until her appt. Advised pt to call around to other ENT offices to see about getting her in sooner in the meantime.

## 2022-07-05 NOTE — Telephone Encounter (Signed)
Ct report faxed to [place], patient advised she needs to request images at [facility] and take to appointment

## 2022-07-12 ENCOUNTER — Other Ambulatory Visit (HOSPITAL_COMMUNITY): Payer: Self-pay | Admitting: Otolaryngology

## 2022-07-12 DIAGNOSIS — R221 Localized swelling, mass and lump, neck: Secondary | ICD-10-CM

## 2022-07-13 NOTE — Progress Notes (Signed)
Irish Lack, MD  Leodis Rains D PROCEDURE / BIOPSY REVIEW Date: 07/12/22  Requested Biopsy site: Right neck mass vs adjacent lymph node Reason for request: Right neck mass Imaging review: Best seen on neck CT  Decision: Approved Imaging modality to perform: Ultrasound Schedule with: Moderate Sedation Schedule for: Any VIR  Additional comments: @VIR : If not good posterolateral window to the primary mass itself, then the adjacent metastatic LN's inferior to the mass should be approachable. @Schedulers . Would use sedation since this is a potential complicated neck biopsy.  Please contact me with questions, concerns, or if issue pertaining to this request arise.  Reola Calkins, MD [facility]

## 2022-07-17 DIAGNOSIS — R221 Localized swelling, mass and lump, neck: Secondary | ICD-10-CM | POA: Diagnosis not present

## 2022-07-24 DIAGNOSIS — R221 Localized swelling, mass and lump, neck: Secondary | ICD-10-CM | POA: Diagnosis not present

## 2022-07-24 DIAGNOSIS — C479 Malignant neoplasm of peripheral nerves and autonomic nervous system, unspecified: Secondary | ICD-10-CM | POA: Diagnosis not present

## 2022-07-25 ENCOUNTER — Other Ambulatory Visit (HOSPITAL_COMMUNITY): Payer: Self-pay | Admitting: Otolaryngology

## 2022-07-25 DIAGNOSIS — R221 Localized swelling, mass and lump, neck: Secondary | ICD-10-CM

## 2022-07-27 DIAGNOSIS — C760 Malignant neoplasm of head, face and neck: Secondary | ICD-10-CM | POA: Diagnosis not present

## 2022-07-27 DIAGNOSIS — C4442 Squamous cell carcinoma of skin of scalp and neck: Secondary | ICD-10-CM | POA: Diagnosis not present

## 2022-07-27 DIAGNOSIS — R221 Localized swelling, mass and lump, neck: Secondary | ICD-10-CM | POA: Diagnosis not present

## 2022-07-30 NOTE — Progress Notes (Signed)
Skotnicki, Meghan A, DO  Leodis Rains D Yes that's fine, thank you       Previous Messages    ----- Message ----- From: Dorise Hiss Sent: 07/30/2022   8:47 AM EDT To: Skeet Latch Skotnicki, DO Subject: [place] BX                                          Per patient she had BX done at [facility].  Please advise if OK to cancel this request for [place]?  Thanks [employer]

## 2022-07-31 ENCOUNTER — Encounter (HOSPITAL_COMMUNITY): Payer: Self-pay

## 2022-07-31 ENCOUNTER — Ambulatory Visit (HOSPITAL_COMMUNITY): Payer: 59

## 2022-08-02 DIAGNOSIS — C4442 Squamous cell carcinoma of skin of scalp and neck: Secondary | ICD-10-CM | POA: Diagnosis not present

## 2022-08-03 ENCOUNTER — Inpatient Hospital Stay
Admission: RE | Admit: 2022-08-03 | Discharge: 2022-08-03 | Disposition: A | Discharge status: Home / Self Care | Payer: Self-pay | Source: Ambulatory Visit | Attending: Radiation Oncology | Admitting: Radiation Oncology

## 2022-08-03 ENCOUNTER — Other Ambulatory Visit: Payer: Self-pay

## 2022-08-03 DIAGNOSIS — C760 Malignant neoplasm of head, face and neck: Secondary | ICD-10-CM

## 2022-08-03 NOTE — Progress Notes (Incomplete)
Head and Neck Cancer Location of Tumor / Histology:  right parapharyngeal space  CT SOFT TISSUE NECK W CONTRAST 06-26-22 IMPRESSION: 1. 4.4 cm x 3.0 cm x 4.1 cm soft tissue mass centered in the right parapharyngeal space favored to be arising from the deep lobe of the right parotid gland most suspicious for primary malignant parotid neoplasm, with prominent right-sided cervical and upper mediastinal lymph nodes detailed above suspicious for metastatic spread of disease. Paraganglioma can also occur in this location but is considered less likely. The mass encases a portion of the right external carotid artery and partially encases the right internal carotid artery. Recommend ENT consultation and tissue sampling. 2. Emphysema.  MRI Orbit Face And/Or Neck W And WO Contrast  07-24-22 IMPRESSION:  1.  Infiltrative mass lesion centered in the right carotid, parapharyngeal and parotid spaces with extensive surrounding local invasion as described above. Primary differential considerations are for aggressive salivary malignancy (potentially parotid origin), malignant peripheral nerve sheath tumor, malignant degeneration of a paraganglioma, or sarcoma. The posterolateral aspect of the mass is relatively superficial in location and would be amenable to percutaneous biopsy. 2.  Findings concerning for metastatic adenopathy in the right neck. There are additional mildly enlarged and rounded lymph nodes involving the upper mediastinum and contralateral left jugular chain which are nonspecific and warrant attention on follow-up imaging. PET/CT could provide more complete evaluation of nodal metastatic disease as clinically indicated. 3.  Tumor extends in close proximity to multiple skull base foramina. There is evidence of right hypoglossal nerve involvement contributing to right hemitongue denervation changes. No evidence of intracranial perineural tumor spread. 4.  Tumor thrombus within the encased right  internal jugular vein.    Patient presented symptoms of: (per ENT note) Right neck mass. Patient states this has been present for at least 2 years, but has noticed progressive growth of lesion over the last several months.  Biopsies revealed:  07-27-22 Final Diagnosis   A.  NECK, RIGHT, FINE NEEDLE ASPIRATION II:  Carcinoma with squamous features.               See comment.                B.   NECK, RIGHT,CORE NEEDLE BIOPSY:               Carcinoma with squamous features.               See comment.                 Electronically signed by Juanda Crumble, MD on 08/01/2022 at 11:16 AM  Comment   The specimen demonstrates large pleomorphic cells that are squamoid in appearance. Immunoperoxidase studies performed on the core biopsy show the malignant cells are strongly and diffusely positive for p40 and p16. RNA in situ hybridization for high risk HPV is focally positive, indicating the specimen is positive for one or more of the following HPV types: type 16, 18, 26, 31, 33, 35, 39, 45, 51, 52, 53, 56, 58, 59, 66, 68, 73, and 82. The tumor cells are negative for cytokeratin 7 and S100.    The histology and immunophenotype support the diagnosis of carcinoma with squamous features, favor squamous cell carcinoma; however, other tumors with squamous differentiation cannot entirely be excluded. Clinical and radiologic correlation is recommended.    Dr. Dierdre Searles has reviewed the case for confirmation of malignancy.    Nutrition Status Yes No Comments  Weight changes? []  [x]  Has not  noticed changes  Swallowing concerns? [x]  []  Feels like food and fluid get stuck at times, stop taking some pills  PEG? []  [x]     Referrals Yes No Comments  Social Work? []  [x]    Dentistry? []  [x]  Next appt in June, questions about splatter gaurd  Swallowing therapy? [x]  []  Will need  Nutrition? [x]  []  Will need   Med/Onc? [x]  []  Appt with Dr. Al Pimple is scheduled   Safety Issues Yes No Comments  Prior radiation?  []  [x]    Pacemaker/ICD? []  [x]    Possible current pregnancy? []  [x]    Is the patient on methotrexate? []  [x]     Tobacco/Marijuana/Snuff/ETOH use: no, former smoker, quit in 1998  Past/Anticipated interventions by otolaryngology, if any:  Dr. Irene Limbo on 05-07-22 HPI:  Mass  Nichole Dominguez is a 67 y.o. female who presents as a new consult, referred by Lemont Fillers, NP for evaluation and treatment of right neck mass. Patient states this has been present for at least 2 years, but has noticed progressive growth of lesion over the last several months. She denies pain, unintentional weight loss, dysphagia, odynophagia, fevers, chills, night sweats. She is a former smoker, but quit over 10 years ago. She has not had any workup to include imaging or biopsy.  Procedures:  Procedure: Fine Needle Aspiration Biopsy   Impression & Plans:  Nichole Dominguez is a 67 y.o. female with right sided neck mass, present for approximately 2 years but with progression in size over the last several months. Exam demonstrates a relatively indurated mass lesion, extending from the mid-parotid gland to level II in the neck, measuring approximately 4cm. Nasolaryngoscopy performed today was unremarkable. An FNA was performed in the office today, patient tolerated well. I also recommend further evaluation with dedicated CT neck with contrast. Further recommendations pending results of imaging and cytopathology.   Dr. Christoper Allegra on 07-17-22  Laryngoscopy Flexible Diagnostic Findings:  - The nasal cavity and nasopharynx are unremarkable.  - The tongue base, pharyngeal walls, piriform sinuses, vallecula, epiglottis and postcricoid region are normal in appearance with the excpetion of :  Mild right pharyngeal fullness. Left tongue base mucocele. Right tongue with limited anterior movement.  - The visualized portion of the subglottis and proximal trachea is widely patent.  - The vocal folds are mobile bilaterally with  compleye glottal closure. Posisbly, mild right vocal fold hypomobility - There are no obvious vocal fold lesions   Impression/Plan  1. 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  - MR neck w/wo contrast - PET head and neck cancer  - IR guided biopsy as scheduled at [facility] note, if she needs chemoXRT, she would like to be treated in [place].    Past/Anticipated interventions by medical oncology, if any: Pt to see Dr. Al Pimple on 08-17-22    Current Complaints / other details:  Pt is concerned for headaches that she has developed over the last 3-4 weeks. According to pt, Dr. Christoper Allegra stated they are headaches from nerves related to her tumor. She was started on Gabapentin and has to increase this dose (some admitted on her own). She also takes Tylenol for these. She also had questions about scatter guards and when they would be made. I will contact [facility] with this question. Overall she is nervous and wants to know the plan. Rn reminded pt of lab appt and ct sim appt as well.

## 2022-08-09 ENCOUNTER — Telehealth: Payer: Self-pay

## 2022-08-09 ENCOUNTER — Encounter: Payer: Self-pay | Admitting: Radiation Oncology

## 2022-08-09 ENCOUNTER — Other Ambulatory Visit: Payer: Self-pay

## 2022-08-09 DIAGNOSIS — C140 Malignant neoplasm of pharynx, unspecified: Secondary | ICD-10-CM

## 2022-08-09 NOTE — Telephone Encounter (Signed)
RN called pt to obtain meaningful use and nurse evaluation information. Consult note complete and was routed to Dr. Basilio Cairo.

## 2022-08-09 NOTE — Progress Notes (Addendum)
Radiation Oncology         [phone] ________________________________  Initial Outpatient Consultation  Name: Nichole Dominguez MRN: [id number]  Date: 08/10/2022  DOB: Jun 09, 1955  CC:O'Sullivan, Efraim Kaufmann, NP  Patwa, Gaynell Face, MD   REFERRING PHYSICIAN: Efrain Sella, MD  DIAGNOSIS:    ICD-10-CM   1. Malignant neoplasm of parapharyngeal space (HCC)  C14.0 sodium chloride flush (NS) 0.9 % injection 10 mL    gabapentin (NEURONTIN) 300 MG capsule    2. Secondary malignant neoplasm of cervical lymph node (HCC)  C77.0      Cancer Staging  Secondary malignant neoplasm of cervical lymph node (HCC) Staging form: Pharynx - HPV-Mediated Oropharynx, AJCC 8th Edition - Clinical stage from 08/10/2022: Stage I (cT0, cN1, cM0, p16+) - Signed by Lonie Peak, MD on 08/11/2022 Stage prefix: Initial diagnosis  (Staging dose not acknowledge the bulky and infiltrative nature of her dominant neck mass)  Mass lesion centered in the right carotid, parapharyngeal and parotid spaces with extensive surrounding local invasion and extensive nodal involvement - biopsy favors squamous cell carcinoma without a clear primary; P16 diffusely positive; HPV focally positive  CHIEF COMPLAINT: Here to discuss management of parapharyngeal cancer  HISTORY OF PRESENT ILLNESS::Nichole Dominguez is a 67 y.o. female who presented to her PCP earlier this year with a right neck mass which had been present for several years.   She was subseuquently referred to Dr. Marene Lenz [facility]) on 05/07/22 for further management. Laryngoscopy performed at that time revealed a relatively indurated mass lesion, extending from the mid-parotid gland to level II in the neck, measuring approximately 4cm.   A biopsy of the parotid mass was collected by Dr. Marene Lenz on 05/07/22. Results were however non diagnostic.   She began to develop jaw pain and right neck pain and had a soft tissue neck CT performed on 06/26/22 which showed: a 4.4 cm x  3.0 cm x 4.1 cm soft tissue mass centered in the right parapharyngeal space, favored to be arising from the deep lobe of the right parotid gland, 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 The mass appeared to encase a portion of the right external carotid artery and partially encases the right internal carotid artery Prominent right-sided cervical and upper mediastinal  lymph nodes were also appreciated, suspicious for metastatic spread of  disease.   The patient was later referred by Dr. Marene Lenz to Dr. Christoper Allegra at [facility] on 07/17/22. Laryngoscopy performed during this visit was notable for mild right pharyngeal fullness, mild right vocal fold hypomobility, and limited anterior movement of the right tongue. Based on CT findings, Dr. Christoper Allegra recommended proceeding with repeat IR guided biopsy of the mass, as well as staging work-up including an MRI of the neck and PET scan.   Subsequent MRI of the face and neck on 07/24/22 showed the infiltrative mass lesion centered in the right carotid, parapharyngeal and parotid spaces with extensive surrounding local invasion, and findings concerning for metastatic adenopathy in the right neck and additional nonspecific mildly enlarged and rounded lymph nodes involving the upper mediastinum and contralateral left jugular chain. MRI also showed evidence of tumor extension within close proximity to multiple skill base foramina, along with evidence of right hypoglossal nerve involvement contributing to right hemitongue denervation changes, and tumor thrombus within the encased right internal jugular vein. MRI otherwise showed no evidence of intracranial perineural tumor spread.   FNA of the right neck mass collected on 07/27/22 showed carcinoma with squamous features, favoring squamous cell carcinoma (however,  other tumors with squamous differentiation cannot entirely be excluded); P16 diffusely positive; HPV focally positive.   Whole body PET scan on 08/02/22  demonstrated: the dominant right level IIa metastatic nodal mass measuring 4.9 x 2.5 cm with an SUV max of 15; multiple ipsilateral nodal metastases involving the right level 3, level 4, and level 5 lymph node stations including the dominant right level 3 nodal metastasis (measuring 1.1 cm) coming in contact with the right oropharynx without discrete primary tumor; FDG avidity within the left palatine tonsil (normal in size and with a max SUV of 4.4); and mildly FDG avid mediastinal, bihilar, and subcarinal lymph nodes. No other sites concerning for metastatic disease were appreciated elsewhere in the body.   Given that there is encasement of carotid, surgical resection is not advised. Subsequently, Dr. Christoper Allegra has recommended evaluation for chemoXRT, which we will discuss in detail today.   Swallowing issues, if any: dysphagia only with food (feels tight)  Weight Changes: none  Pain status: jaw/right neck pain   Other symptoms: right neck swelling, headaches which began after the non-diagnostic biopsy collected in January   Tobacco history, if any: former smokier, quit approximately 26 years ago  ETOH abuse, if any: drinks on occasion     Past/Anticipated interventions by medical oncology, if any: Pt to see Dr. Al Pimple on 08-17-22   Current Complaints / other details:  Pt is concerned for headaches that she has developed over the last 3-4 weeks. According to pt, Dr. Christoper Allegra stated they are headaches from nerves related to her tumor. She was started on Gabapentin and has to increase this dose (some admitted on her own). She also takes Tylenol for these. S    PREVIOUS RADIATION THERAPY: No  PAST MEDICAL HISTORY:  has a past medical history of Allergy, GERD (gastroesophageal reflux disease), Osteopenia (01/04/2020), Plantar fasciitis, and Seasonal allergies.    PAST SURGICAL HISTORY: Past Surgical History:  Procedure Laterality Date   COLONOSCOPY  2011   MOUTH SURGERY  01/2020   implant inserted    TUBAL LIGATION  1992    FAMILY HISTORY: family history includes Breast cancer in her sister; Cancer in her father and paternal grandfather; Cervical cancer in her mother; Colon polyps in her mother; Diabetes in her maternal grandmother and mother; Diabetes Mellitus II in her paternal grandmother; Heart attack in her father and son; Heart disease in her maternal grandmother and mother; Hypertension in her maternal grandmother and mother; Lupus in her sister; Obesity in her maternal grandmother and mother; Stroke in her maternal grandmother; Thyroid cancer in her father.  SOCIAL HISTORY:  reports that she quit smoking about 25 years ago. Her smoking use included cigarettes. She has a 24.00 pack-year smoking history. She has never used smokeless tobacco. She reports current alcohol use. She reports that she does not use drugs.  ALLERGIES: Amlodipine, Darvon [propoxyphene], and Sudafed [pseudoephedrine hcl]  MEDICATIONS:  Current Outpatient Medications  Medication Sig Dispense Refill   augmented betamethasone dipropionate (DIPROLENE-AF) 0.05 % ointment Apply topically 2 (two) times daily. 15 g 0   fluticasone (FLONASE) 50 MCG/ACT nasal spray USE 2 SPRAYS IN EACH NOSTRIL DAILY 48 g 3   gabapentin (NEURONTIN) 300 MG capsule Take 1 capsule (300 mg total) by mouth 4 (four) times daily. 120 capsule 0   losartan (COZAAR) 50 MG tablet Take 1 tablet (50 mg total) by mouth daily. 90 tablet 1   omeprazole (PRILOSEC) 20 MG capsule TAKE 1 CAPSULE (20 MG TOTAL) BY MOUTH EVERY MORNING.  90 capsule 1   Calcium Carbonate (CALCIUM 600 HIGH POTENCY PO) Take 3 capsules by mouth daily. (Patient not taking: Reported on 08/09/2022)     cholecalciferol (VITAMIN D3) 25 MCG (1000 UNIT) tablet Take 1,000 Units by mouth daily. (Patient not taking: Reported on 08/09/2022)     diphenhydrAMINE (BENADRYL) 25 mg capsule Take 25 mg by mouth at bedtime as needed.  (Patient not taking: Reported on 08/09/2022)     loratadine (CLARITIN) 10  MG tablet Take 10 mg by mouth daily. (Patient not taking: Reported on 08/09/2022)     Multiple Vitamins-Minerals (MULTIVITAMIN WITH MINERALS) tablet Take 1 tablet by mouth daily. (Patient not taking: Reported on 08/09/2022)     Zinc 50 MG TABS Take 1 tablet by mouth daily as needed. (Patient not taking: Reported on 08/09/2022)     No current facility-administered medications for this encounter.    REVIEW OF SYSTEMS:  Notable for that above.   PHYSICAL EXAM:  temperature is 98.1 F (36.7 C). Her blood pressure is 138/78 and her pulse is 89. Her respiration is 18 and oxygen saturation is 100%.   General: Alert and oriented, in no acute distress HEENT: Head is normocephalic. Extraocular movements are intact. Oropharynx is notable for no visible tumor. Tongue points slightly to right Neck: Neck is notable for bulky, fixed, level II right neck mass Heart: Regular in rate and rhythm with no murmurs, rubs, or gallops. Chest: Clear to auscultation bilaterally, with no rhonchi, wheezes, or rales. Abdomen: Soft, nontender, nondistended, with no rigidity or guarding. Extremities: No cyanosis or edema. Lymphatics: see Neck Exam Skin: No concerning lesions. Musculoskeletal: symmetric strength and muscle tone throughout. Neurologic: Cranial nerves II through XII are grossly intact. No obvious focalities. Speech is fluent. Coordination is intact. Psychiatric: Judgment and insight are intact. Affect is appropriate.   ECOG = 1  0 - Asymptomatic (Fully active, able to carry on all predisease activities without restriction)  1 - Symptomatic but completely ambulatory (Restricted in physically strenuous activity but ambulatory and able to carry out work of a light or sedentary nature. For example, light housework, office work)  2 - Symptomatic, <50% in bed during the day (Ambulatory and capable of all self care but unable to carry out any work activities. Up and about more than 50% of waking hours)  3 -  Symptomatic, >50% in bed, but not bedbound (Capable of only limited self-care, confined to bed or chair 50% or more of waking hours)  4 - Bedbound (Completely disabled. Cannot carry on any self-care. Totally confined to bed or chair)  5 - Death   Santiago Glad MM, Creech RH, Tormey DC, et al. [phone]). "Toxicity and response criteria of the [facility]". Am. Evlyn Clines. Oncol. 5 (6): 649-55   LABORATORY DATA:  Lab Results  Component Value Date   WBC 6.8 12/28/2019   HGB 15.3 12/28/2019   HCT 46.2 (H) 12/28/2019   MCV 87.7 12/28/2019   PLT 375 12/28/2019   CMP     Component Value Date/Time   NA 137 03/27/2022 1135   K 4.5 03/27/2022 1135   CL 101 03/27/2022 1135   CO2 27 03/27/2022 1135   GLUCOSE 103 (H) 03/27/2022 1135   BUN 17 08/10/2022 1247   CREATININE 0.94 08/10/2022 1247   CREATININE 1.02 (H) 12/28/2019 0749   CALCIUM 9.8 03/27/2022 1135   PROT 7.3 12/28/2019 0749   ALBUMIN 4.2 12/24/2018 0942   AST 18 12/28/2019 0749   ALT 49 (H)  12/28/2019 0749   ALKPHOS 124 (H) 12/24/2018 0942   BILITOT 0.7 12/28/2019 0749   GFRNONAA >60 08/10/2022 1247      Lab Results  Component Value Date   TSH 1.515 08/10/2022     RADIOGRAPHY: No results found.    IMPRESSION/PLAN:  This is a delightful patient with infiltrative head and neck cancer with unknown primary. I recommend radiotherapy for this patient.  Dr Christoper Allegra and I spoke - due to the aggressive nature of her disease, he does not recommend elective procedures that will significantly delay treatment. She will not undergo mucosal bx's of throat to determine if there is a known primary.  I'll treat the oropharynx (EBV was neg) and b/l neck. CT simulation today. Will have her bite on a spacer to allow room for SPDs to be made by dentistry before tx begins. Anticipate concurrent chemotherapy - Dr Al Pimple sees her next week.  Anticipate PET and PAC placement.  We discussed the potential risks, benefits, and side effects of  radiotherapy. We talked in detail about acute and late effects. We discussed that some of the most bothersome acute effects may be mucositis, dysgeusia, salivary changes, skin irritation, hair loss, dehydration, weight loss and fatigue. We talked about late effects which include but are not necessarily limited to dysphagia, hypothyroidism, nerve injury, vascular injury, spinal cord injury, xerostomia, trismus, neck edema, and potential injury to any of the tissues in the head and neck region. No guarantees of treatment were given. A consent form was signed and placed in the patient's medical record. The patient is enthusiastic about proceeding with treatment. I look forward to participating in the patient's care.    We also discussed that the treatment of head and neck cancer is a multidisciplinary process to maximize treatment outcomes and quality of life. For this reason the following referrals have been or will be made:  Medical oncology to discuss chemotherapy   Nutritionist for nutrition support during and after treatment.  Speech language pathology for swallowing and/or speech therapy.  Social work for social support.   Physical therapy due to risk of lymphedema in neck and deconditioning.  Baseline labs including TSH.  Gabapentin refilled for HAs; referral to neuro/onc, Dr Barbaraann Cao for management. Hopefully if tumor shrinks, the severe HAs will improve.  On date of service, in total, I spent 60 minutes on this encounter. Patient was seen in person.  Addendum: Of note, patient denies prior skin cancers. __________________________________________   Lonie Peak, MD  This document serves as a record of services personally performed by Lonie Peak, MD. It was created on her behalf by Neena Rhymes, a trained medical scribe. The creation of this record is based on the scribe's personal observations and the provider's statements to them. This document has been checked and approved by the  attending provider.

## 2022-08-09 NOTE — Progress Notes (Signed)
Bun and Crt, Tsh ordered prior to CT Sim with IV contrast.

## 2022-08-09 NOTE — Progress Notes (Signed)
Oncology Nurse Navigator Documentation   Placed introductory call to new referral patient Nichole Dominguez.  Introduced myself as the H&N oncology nurse navigator that works with Dr. Basilio Cairo and Dr. Al Pimple to whom she has been referred by Dr. Christoper Allegra. She confirmed understanding of referral. Briefly explained my role as her navigator, provided my contact information.  Confirmed understanding of upcoming appts and [facility] location, explained arrival and registration process. She asked about SPDs during treatment. I explained that Dr. Basilio Cairo would talk to her about that tomorrow but I let her know that I have also contacted the group that does SPDs for [place] to get things started if Dr. Basilio Cairo recommends them.   I encouraged her to call with questions/concerns as she moves forward with appts and procedures.   She verbalized understanding of information provided, expressed appreciation for my call.     [name] RN, BSN, OCN Head & Neck Oncology Nurse Navigator [facility] at [facility] Phone # [phone]  Fax # [phone]

## 2022-08-10 ENCOUNTER — Ambulatory Visit
Admission: RE | Admit: 2022-08-10 | Discharge: 2022-08-10 | Disposition: A | Discharge status: Home / Self Care | Payer: 59 | Source: Ambulatory Visit | Attending: Radiation Oncology | Admitting: Radiation Oncology

## 2022-08-10 ENCOUNTER — Other Ambulatory Visit: Payer: Self-pay

## 2022-08-10 ENCOUNTER — Ambulatory Visit
Admission: RE | Admit: 2022-08-10 | Discharge: 2022-08-10 | Disposition: A | Discharge status: Home / Self Care | Payer: 59 | Source: Ambulatory Visit

## 2022-08-10 VITALS — BP 138/78 | HR 89 | Temp 98.1°F | Resp 18

## 2022-08-10 DIAGNOSIS — C801 Malignant (primary) neoplasm, unspecified: Secondary | ICD-10-CM | POA: Diagnosis not present

## 2022-08-10 DIAGNOSIS — Z79899 Other long term (current) drug therapy: Secondary | ICD-10-CM | POA: Diagnosis not present

## 2022-08-10 DIAGNOSIS — C770 Secondary and unspecified malignant neoplasm of lymph nodes of head, face and neck: Secondary | ICD-10-CM | POA: Insufficient documentation

## 2022-08-10 DIAGNOSIS — M8580 Other specified disorders of bone density and structure, unspecified site: Secondary | ICD-10-CM | POA: Diagnosis not present

## 2022-08-10 DIAGNOSIS — K219 Gastro-esophageal reflux disease without esophagitis: Secondary | ICD-10-CM | POA: Insufficient documentation

## 2022-08-10 DIAGNOSIS — C140 Malignant neoplasm of pharynx, unspecified: Secondary | ICD-10-CM

## 2022-08-10 DIAGNOSIS — Z87891 Personal history of nicotine dependence: Secondary | ICD-10-CM | POA: Insufficient documentation

## 2022-08-10 LAB — BUN & CREATININE (CHCC)
BUN: 17 mg/dL (ref 8–23)
Creatinine: 0.94 mg/dL (ref 0.44–1.00)
GFR, Estimated: >60 mL/min (ref >60)

## 2022-08-10 LAB — TSH: TSH: 1.515 u[IU]/mL (ref 0.350–4.500)

## 2022-08-10 MED ORDER — SODIUM CHLORIDE 0.9% FLUSH
10.0000 mL | Freq: Once | INTRAVENOUS | Status: AC
  Administered 2022-08-10: 10 mL via INTRAVENOUS

## 2022-08-10 MED ORDER — GABAPENTIN 300 MG PO CAPS
300.0000 mg | ORAL_CAPSULE | Freq: Four times a day (QID) | ORAL | 0 refills | Status: DC
Start: 2022-08-10 — End: 2022-08-31

## 2022-08-10 NOTE — Progress Notes (Signed)
Oncology Nurse Navigator Documentation   Met with patient during initial consult with Dr. Basilio Cairo. She was accompanied by her husband.  Further introduced myself as her/their Navigator, explained my role as a member of the Care Team. Provided New Patient resource guide binder: Contact information for physicians, this navigator, other members of the Care Team Advance Directive information; provided [facility] AD booklet at their request,  Fall Prevention Patient Safety Plan Financial Assistance Information sheet Symptom Management Clinic information [facility] campus map with highlight of [facility] SLP Information sheet Head and Neck cancer basics Nutrition information Patient and family support information including Spiritual care/Chaplain information, Peer mentor program, health and wellness classes, and the survivorship program Community resources  Provided and discussed educational handouts for PEG and PAC. Assisted with post-consult appt scheduling. They verbalized understanding of information provided. I encouraged them to call with questions/concerns moving forward.  She also completed her CT simulation today without difficulty.  [name], RN, BSN, OCN Head & Neck Oncology Nurse Navigator [facility] at [place] [phone]

## 2022-08-10 NOTE — Progress Notes (Signed)
Has armband been applied?  Yes.    Does patient have an allergy to IV contrast dye?: No.   Has patient ever received premedication for IV contrast dye?: No.   Date of lab work: Aug 10, 2022 BUN: 17 CR: 0.94 eGFR: >60  Does patient take metformin?: No.  Is eGFR >60?: Yes.   If no, when can patient resume? (Must be 48 hrs AFTER they receive IV contrast):  N/A  IV site: left forearm posterior, condition patent and no redness  Has IV site been added to flowsheet?  Yes.    BP 138/78   Pulse 89   Temp 98.1 F (36.7 C)   Resp 18   SpO2 100%

## 2022-08-11 ENCOUNTER — Encounter: Payer: Self-pay | Admitting: Radiation Oncology

## 2022-08-11 DIAGNOSIS — C770 Secondary and unspecified malignant neoplasm of lymph nodes of head, face and neck: Secondary | ICD-10-CM | POA: Insufficient documentation

## 2022-08-13 ENCOUNTER — Other Ambulatory Visit: Payer: Self-pay

## 2022-08-13 DIAGNOSIS — C760 Malignant neoplasm of head, face and neck: Secondary | ICD-10-CM

## 2022-08-16 ENCOUNTER — Other Ambulatory Visit: Payer: Self-pay

## 2022-08-16 DIAGNOSIS — C770 Secondary and unspecified malignant neoplasm of lymph nodes of head, face and neck: Secondary | ICD-10-CM

## 2022-08-17 ENCOUNTER — Inpatient Hospital Stay: Payer: 59 | Admitting: Hematology and Oncology

## 2022-08-17 ENCOUNTER — Other Ambulatory Visit: Payer: Self-pay

## 2022-08-17 ENCOUNTER — Encounter: Payer: Self-pay | Admitting: Hematology and Oncology

## 2022-08-17 ENCOUNTER — Telehealth: Payer: Self-pay | Admitting: Dietician

## 2022-08-17 ENCOUNTER — Inpatient Hospital Stay: Payer: 59

## 2022-08-17 VITALS — BP 153/72 | HR 85 | Temp 97.7°F | Resp 18 | Wt 161.3 lb

## 2022-08-17 DIAGNOSIS — C770 Secondary and unspecified malignant neoplasm of lymph nodes of head, face and neck: Secondary | ICD-10-CM

## 2022-08-17 DIAGNOSIS — C140 Malignant neoplasm of pharynx, unspecified: Secondary | ICD-10-CM | POA: Diagnosis not present

## 2022-08-17 DIAGNOSIS — Z87891 Personal history of nicotine dependence: Secondary | ICD-10-CM | POA: Insufficient documentation

## 2022-08-17 DIAGNOSIS — R87810 Cervical high risk human papillomavirus (HPV) DNA test positive: Secondary | ICD-10-CM | POA: Insufficient documentation

## 2022-08-17 DIAGNOSIS — C801 Malignant (primary) neoplasm, unspecified: Secondary | ICD-10-CM | POA: Insufficient documentation

## 2022-08-17 DIAGNOSIS — Z510 Encounter for antineoplastic radiation therapy: Secondary | ICD-10-CM | POA: Diagnosis not present

## 2022-08-17 DIAGNOSIS — R449 Unspecified symptoms and signs involving general sensations and perceptions: Secondary | ICD-10-CM | POA: Insufficient documentation

## 2022-08-17 DIAGNOSIS — Z79899 Other long term (current) drug therapy: Secondary | ICD-10-CM | POA: Insufficient documentation

## 2022-08-17 DIAGNOSIS — R221 Localized swelling, mass and lump, neck: Secondary | ICD-10-CM | POA: Insufficient documentation

## 2022-08-17 DIAGNOSIS — Z5111 Encounter for antineoplastic chemotherapy: Secondary | ICD-10-CM | POA: Insufficient documentation

## 2022-08-17 LAB — BASIC METABOLIC PANEL - CANCER CENTER ONLY
Anion gap: 7 (ref 5–15)
BUN: 17 mg/dL (ref 8–23)
CO2: 28 mmol/L (ref 22–32)
Calcium: 9 mg/dL (ref 8.9–10.3)
Chloride: 105 mmol/L (ref 98–111)
Creatinine: 0.98 mg/dL (ref 0.44–1.00)
GFR, Estimated: >60 mL/min (ref >60)
Glucose, Bld: 101 mg/dL — ABNORMAL HIGH (ref 70–99)
Potassium: 4.6 mmol/L (ref 3.5–5.1)
Sodium: 140 mmol/L (ref 135–145)

## 2022-08-17 LAB — CBC WITH DIFFERENTIAL (CANCER CENTER ONLY)
Abs Immature Granulocytes: 0.03 10*3/uL (ref 0.00–0.07)
Basophils Absolute: 0.1 10*3/uL (ref 0.0–0.1)
Basophils Relative: 1 %
Eosinophils Absolute: 0.2 10*3/uL (ref 0.0–0.5)
Eosinophils Relative: 3 %
HCT: 40 % (ref 36.0–46.0)
Hemoglobin: 14.2 g/dL (ref 12.0–15.0)
Immature Granulocytes: 0 %
Lymphocytes Relative: 28 %
Lymphs Abs: 2.3 10*3/uL (ref 0.7–4.0)
MCH: 33.1 pg (ref 26.0–34.0)
MCHC: 35.5 g/dL (ref 30.0–36.0)
MCV: 93.2 fL (ref 80.0–100.0)
Monocytes Absolute: 0.8 10*3/uL (ref 0.1–1.0)
Monocytes Relative: 10 %
Neutro Abs: 4.8 10*3/uL (ref 1.7–7.7)
Neutrophils Relative %: 58 %
Platelet Count: 419 10*3/uL — ABNORMAL HIGH (ref 150–400)
RBC: 4.29 MIL/uL (ref 3.87–5.11)
RDW: 12.8 % (ref 11.5–15.5)
WBC Count: 8.3 10*3/uL (ref 4.0–10.5)
nRBC: 0 % (ref 0.0–0.2)

## 2022-08-17 LAB — MAGNESIUM: Magnesium: 2 mg/dL (ref 1.7–2.4)

## 2022-08-17 MED ORDER — LIDOCAINE-PRILOCAINE 2.5-2.5 % EX CREA
TOPICAL_CREAM | CUTANEOUS | 3 refills | Status: DC
Start: 2022-08-17 — End: 2022-10-12

## 2022-08-17 MED ORDER — PROCHLORPERAZINE MALEATE 10 MG PO TABS
10.0000 mg | ORAL_TABLET | Freq: Four times a day (QID) | ORAL | 1 refills | Status: DC | PRN
Start: 2022-08-17 — End: 2022-10-08

## 2022-08-17 MED ORDER — DEXAMETHASONE 4 MG PO TABS
ORAL_TABLET | ORAL | 1 refills | Status: DC
Start: 2022-08-17 — End: 2022-10-03

## 2022-08-17 MED ORDER — ONDANSETRON HCL 8 MG PO TABS
8.0000 mg | ORAL_TABLET | Freq: Three times a day (TID) | ORAL | 1 refills | Status: DC | PRN
Start: 2022-08-17 — End: 2022-10-03

## 2022-08-17 NOTE — Progress Notes (Signed)
[facility] CONSULT NOTE  Patient Care Team: [name], NP as PCP - General (Internal Medicine)  CHIEF COMPLAINTS/PURPOSE OF CONSULTATION:  SCC parapharyngeal space.  ASSESSMENT & PLAN:   This is a very pleasant 67 year old female patient with no significant past medical history referred to my dunk for new diagnosis of squamous cell carcinoma, unknown primary, HPV positive for consideration of concurrent chemoradiation.  She has she has mass lesion centered in the right carotid,  Her mass is centered in the right carotid, parapharyngeal and parotid spaces with extensive surrounding local invasion and extensive nodal involvement biopsy Favor squamous cell carcinoma without a clear primary, p16 diffusely positive HPV focally positive she met my colleague Dr. Basilio Cairo from radiation oncology who has discussed the case in detail with Dr. Christoper Allegra and the recommendation was to consider chemotherapy and radiation.  I have clearly discussed with her that we are not entirely certain that this is an oropharyngeal primary.  If this were indeed oropharyngeal primary, I agree that chemoradiation is more appropriate given she is a nonoperative candidate.  However in the light of this extensive tumor, it is likely reasonable to avoid more biopsies and initiate treatment sooner than later.  I will also discussed this case with Dr. Basilio Cairo once again.  We have discussed about role of cisplatin being a radiosensitizer in the treatment of head and neck cancer.  We have discussed about the curative intent of chemoradiation for this patient.  We have discussed about mechanism of action of cisplatin, adverse effects of cisplatin including but not limited to fatigue, nausea, vomiting, increased risk of infections, mucositis, ototoxicity, nephrotoxicity, peripheral neuropathy.  Patient understands that some of the side effects can be permanent and fatal.  We have discussed about role of Mediport through  G-tube for chemotherapy administration and nutrition respectively since most of these patients have severe mucositis during the treatment. At this time we do not know if weekly cisplatin is inferior to every 21 days cisplatin since there is no head-to-head comparison trial.  We are awaiting results from NRG trial.  I did mention to the patient however weekly cisplatin is well-tolerated with less adverse effects and most of the patients tend to complete treatment as planned.  Patient is willing to proceed with weekly cisplatin.  In case she does not obtain a complete response, she will then have to proceed with salvage surgery  Thank you for consulting [place] in the care of this patient.  Please do not hesitate to contact us with any additional questions or concerns.  HISTORY OF PRESENTING ILLNESS:  Nichole Dominguez 67 y.o. female is here because of Newly diagnosed Head and neck cancer.  This is a pleasant 67 year old female patient who presented to her PCP earlier this year with a right neck mass.  She was subsequently seen by ENT for further management.  She had laryngoscopy which revealed a relatively indurated mass lesion extending from the mid parotid gland to level 2 in the neck measuring approximately 4 cm.  Biopsy of the parotid were nondiagnostic.    CT neck done on June 26, 2022 with 4.4 x 3.0 x 4.1 cm soft tissue mass centered in the right parapharyngeal space favored to be arising from the deep lobe of right parotid gland most suspicious for primary malignant neoplasm with prominent right-sided cervical and upper mediastinal lymph nodes detailed above suspicious for metastatic spread of disease, paraganglioma can also occur in this location but is considered less likely.  Mass encasing a  portion of right external carotid artery and partially encasing the right internal carotid artery.  She was seen by Dr. Christoper Allegra who recommended IR guided biopsy, fine-needle aspiration showed carcinoma with  squamous features, large pleomorphic cells squamoid in appearance, diffusely positive for p40 and p16.  RNA in situ hybridization for high risk HPV is also focally positive  PET/CT shows large right level 2A nodal metastatic conglomerate most consistent with biopsy-proven p16 positive SCC mass abuts right oropharynx without discrete separate primary tumor.  Consider tissue sampling of oropharynx and nasopharynx if clinically indicated.  Not enlarged FDG avid contralateral left palatine tonsil.  Scattered mild mediastinal FDG avid lymph nodes which are borderline in size and possibly reactive.  No definite distant metastasis.  Scattered pulmonary nodules below PET resolution.  She was seen by Dr. Basilio Cairo from radiation oncology on 08/10/2022 and the recommendation was to consider concurrent chemoradiation.  According to Dr. Colletta Maryland note based on communication between her and Dr. Christoper Allegra he does not recommend elective procedures that will significantly delay treatment due to aggressive nature of her disease.  She will not undergo mucosal biopsies to determine if there is unknown primary.  She is here today with her husband as well as our Statistician.  She is scheduled for a port and G-tube placement next week.  She complains of some pain radiating to the right ear because of this mass.  She has been taking Tylenol twice a day and gabapentin for pain management.  She has no difficulty swallowing.  She is healthy at baseline, quit smoking in 1990s.  She is currently retired.  She is very active.  No baseline hearing loss Rest of the pertinent 10 point ROS reviewed and negative  MEDICAL HISTORY:  Past Medical History:  Diagnosis Date   Allergy    GERD (gastroesophageal reflux disease)    Osteopenia 01/04/2020   Plantar fasciitis    Seasonal allergies     SURGICAL HISTORY: Past Surgical History:  Procedure Laterality Date   COLONOSCOPY  2011   MOUTH SURGERY  01/2020   implant inserted   TUBAL  LIGATION  1992    SOCIAL HISTORY: Social History   Socioeconomic History   Marital status: Married    Spouse name: Not on file   Number of children: Not on file   Years of education: Not on file   Highest education level: Not on file  Occupational History   Not on file  Tobacco Use   Smoking status: Former    Packs/day: 1.00    Years: 24.00    Additional pack years: 0.00    Total pack years: 24.00    Types: Cigarettes    Quit date: 10/10/1996    Years since quitting: 25.8   Smokeless tobacco: Never  Vaping Use   Vaping Use: Never used  Substance and Sexual Activity   Alcohol use: Yes    Alcohol/week: 0.0 standard drinks of alcohol    Comment: 1 shot vodka daily   Drug use: No   Sexual activity: Yes    Partners: Male  Other Topics Concern   Not on file  Social History Narrative   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    Lives with husband   1 daughter in [place], 82 grandchildren   Son died   2 grown stepchildren- both Designer, multimedia, grandchildren   Enjoys Corporate investment banker   Social Determinants of Corporate investment banker Strain: Not on file  Food Insecurity: No Food Insecurity (08/09/2022)  Hunger Vital Sign    Worried About Running Out of Food in the Last Year: Never true    Ran Out of Food in the Last Year: Never true  Transportation Needs: No Transportation Needs (08/09/2022)   PRAPARE - Administrator, Civil Service (Medical): No    Lack of Transportation (Non-Medical): No  Physical Activity: Not on file  Stress: Not on file  Social Connections: Not on file  Intimate Partner Violence: Not At Risk (08/09/2022)   Humiliation, Afraid, Rape, and Kick questionnaire    Fear of Current or Ex-Partner: No    Emotionally Abused: No    Physically Abused: No    Sexually Abused: No    FAMILY HISTORY: Family History  Problem Relation Age of Onset   Diabetes Mother    Heart disease Mother        chf   Hypertension Mother    Cervical cancer  Mother    Obesity Mother    Colon polyps Mother    Thyroid cancer Father    Cancer Father        thyroid x2   Heart attack Father    Breast cancer Sister    Lupus Sister    Obesity Maternal Grandmother    Diabetes Maternal Grandmother    Heart disease Maternal Grandmother    Hypertension Maternal Grandmother    Stroke Maternal Grandmother    Diabetes Mellitus II Paternal Grandmother        died from diabetic coma   Cancer Paternal Grandfather        not sure what type   Heart attack Son    Esophageal cancer Neg Hx    Stomach cancer Neg Hx    Rectal cancer Neg Hx    Colon cancer Neg Hx     ALLERGIES:  is allergic to amlodipine, darvon [propoxyphene], and sudafed [pseudoephedrine hcl].  MEDICATIONS:  Current Outpatient Medications  Medication Sig Dispense Refill   augmented betamethasone dipropionate (DIPROLENE-AF) 0.05 % ointment Apply topically 2 (two) times daily. 15 g 0   Calcium Carbonate (CALCIUM 600 HIGH POTENCY PO) Take 3 capsules by mouth daily. (Patient not taking: Reported on 08/09/2022)     cholecalciferol (VITAMIN D3) 25 MCG (1000 UNIT) tablet Take 1,000 Units by mouth daily. (Patient not taking: Reported on 08/09/2022)     diphenhydrAMINE (BENADRYL) 25 mg capsule Take 25 mg by mouth at bedtime as needed.  (Patient not taking: Reported on 08/09/2022)     fluticasone (FLONASE) 50 MCG/ACT nasal spray USE 2 SPRAYS IN EACH NOSTRIL DAILY 48 g 3   gabapentin (NEURONTIN) 300 MG capsule Take 1 capsule (300 mg total) by mouth 4 (four) times daily. 120 capsule 0   loratadine (CLARITIN) 10 MG tablet Take 10 mg by mouth daily. (Patient not taking: Reported on 08/09/2022)     losartan (COZAAR) 50 MG tablet Take 1 tablet (50 mg total) by mouth daily. 90 tablet 1   Multiple Vitamins-Minerals (MULTIVITAMIN WITH MINERALS) tablet Take 1 tablet by mouth daily. (Patient not taking: Reported on 08/09/2022)     omeprazole (PRILOSEC) 20 MG capsule TAKE 1 CAPSULE (20 MG TOTAL) BY MOUTH EVERY MORNING.  90 capsule 1   Zinc 50 MG TABS Take 1 tablet by mouth daily as needed. (Patient not taking: Reported on 08/09/2022)     No current facility-administered medications for this visit.     PHYSICAL EXAMINATION: ECOG PERFORMANCE STATUS: 1 - Symptomatic but completely ambulatory  Vitals:   08/17/22 0946  BP: (!) 153/72  Pulse: 85  Resp: 18  Temp: 97.7 F (36.5 C)  SpO2: 98%   Filed Weights   08/17/22 0946  Weight: 161 lb 4.8 oz (73.2 kg)    GENERAL:alert, no distress and comfortable SKIN: skin color, texture, turgor are normal, no rashes or significant lesions EYES: normal, conjunctiva are pink and non-injected, sclera clear OROPHARYNX:no exudate, no erythema and lips, buccal mucosa, and tongue normal  NECK: Large palpable mass, nontender on the right side of the neck extending behind the ear  LYMPH:  no other palpable abnormalities  LUNGS: clear to auscultation and percussion with normal breathing effort HEART: regular rate & rhythm and no murmurs and no lower extremity edema ABDOMEN:abdomen soft, non-tender and normal bowel sounds Musculoskeletal:no cyanosis of digits and no clubbing  PSYCH: alert & oriented x 3 with fluent speech NEURO: no focal motor/sensory deficits  LABORATORY DATA:  I have reviewed the data as listed Lab Results  Component Value Date   WBC 6.8 12/28/2019   HGB 15.3 12/28/2019   HCT 46.2 (H) 12/28/2019   MCV 87.7 12/28/2019   PLT 375 12/28/2019     Chemistry      Component Value Date/Time   NA 137 03/27/2022 1135   K 4.5 03/27/2022 1135   CL 101 03/27/2022 1135   CO2 27 03/27/2022 1135   BUN 17 08/10/2022 1247   CREATININE 0.94 08/10/2022 1247   CREATININE 1.02 (H) 12/28/2019 0749      Component Value Date/Time   CALCIUM 9.8 03/27/2022 1135   ALKPHOS 124 (H) 12/24/2018 0942   AST 18 12/28/2019 0749   ALT 49 (H) 12/28/2019 0749   BILITOT 0.7 12/28/2019 0749       RADIOGRAPHIC STUDIES: I have personally reviewed the radiological images  as listed and agreed with the findings in the report. No results found.  All questions were answered. The patient knows to call the clinic with any problems, questions or concerns. I spent 60 minutes in the care of this patient including H and P, review of records, counseling and coordination of care.     Rachel Moulds, MD 08/17/2022 10:17 AM

## 2022-08-17 NOTE — Progress Notes (Signed)
Oncology Nurse Navigator Documentation   Met with patient during initial consult with Dr. Al Pimple. She was accompanied by her husband. Assisted with post-consult appt scheduling. They verbalized understanding of information provided. I encouraged them to call with questions/concerns moving forward.   I also met with them to provide PEG/port education prior to 08/21/22 placement.  Using  PEG teaching device   and Teach Back, provided education for PEG use and care, including: hand hygiene, gravity bolus administration of daily water flushes and nutritional supplement, fluids and medications; care of tube insertion site including daily dressing change and cleaning; S&S of infection.   Nichole Dominguez correctly verbalized procedures for gravity administration of water, dressing change and site care.  I provided written instructions for PEG flushing/dressing change in support of verbal instruction.   I provided/described contents of Start of Care Bolus Feeding Kit (3 60 cc syringes, 1 box 4x4 drainage sponges, 1 package mesh briefs, 1 roll paper tape, 1 case Osmolite 1.5).  He voiced understanding he is to start using Osmolite per guidance of Nutrition. She understands I will be available for ongoing PEG support. I will meet with her on Wednesday 5/15 when she is here at the [facility] to help with the first dressing change and flush.  Provided barium sulfate prep which I obtained from [facility] and reviewed instructions.    [name] RN, BSN, OCN Head & Neck Oncology Nurse Navigator [facility] Phone # [phone]  Fax # [phone]    [name], RN, BSN, OCN Head & Neck Oncology Nurse Navigator [facility] [place] [phone]

## 2022-08-17 NOTE — Progress Notes (Signed)
START ON PATHWAY REGIMEN - Head and Neck     A cycle is every 7 days:     Cisplatin   **Always confirm dose/schedule in your pharmacy ordering system**  Patient Characteristics: Oropharynx, HPV Positive, Preoperative or Nonsurgical Candidate (Clinical Staging), cT0-4, cN1-3 or cT3-4, cN0 Disease Classification: Oropharynx HPV Status: Positive (+) Therapeutic Status: Preoperative or Nonsurgical Candidate (Clinical Staging) AJCC T Category: cT0 AJCC 8 Stage Grouping: I AJCC N Category: cN1 AJCC M Category: cM0 Intent of Therapy: Curative Intent, Discussed with Patient

## 2022-08-19 ENCOUNTER — Other Ambulatory Visit: Payer: Self-pay

## 2022-08-20 ENCOUNTER — Other Ambulatory Visit: Payer: Self-pay | Admitting: Hematology and Oncology

## 2022-08-20 ENCOUNTER — Ambulatory Visit
Admission: RE | Admit: 2022-08-20 | Discharge: 2022-08-20 | Disposition: A | Discharge status: Home / Self Care | Payer: 59 | Source: Ambulatory Visit | Attending: Radiation Oncology | Admitting: Radiation Oncology

## 2022-08-20 ENCOUNTER — Other Ambulatory Visit: Payer: Self-pay

## 2022-08-20 ENCOUNTER — Ambulatory Visit: Payer: 59

## 2022-08-20 ENCOUNTER — Other Ambulatory Visit: Payer: Self-pay | Admitting: Radiology

## 2022-08-20 ENCOUNTER — Encounter: Payer: Self-pay | Admitting: Hematology and Oncology

## 2022-08-20 DIAGNOSIS — C760 Malignant neoplasm of head, face and neck: Secondary | ICD-10-CM

## 2022-08-20 DIAGNOSIS — Z5111 Encounter for antineoplastic chemotherapy: Secondary | ICD-10-CM | POA: Diagnosis not present

## 2022-08-20 DIAGNOSIS — C140 Malignant neoplasm of pharynx, unspecified: Secondary | ICD-10-CM | POA: Diagnosis not present

## 2022-08-20 DIAGNOSIS — Z510 Encounter for antineoplastic radiation therapy: Secondary | ICD-10-CM | POA: Diagnosis not present

## 2022-08-20 DIAGNOSIS — C770 Secondary and unspecified malignant neoplasm of lymph nodes of head, face and neck: Secondary | ICD-10-CM

## 2022-08-20 DIAGNOSIS — R449 Unspecified symptoms and signs involving general sensations and perceptions: Secondary | ICD-10-CM | POA: Diagnosis not present

## 2022-08-20 DIAGNOSIS — Z87891 Personal history of nicotine dependence: Secondary | ICD-10-CM | POA: Diagnosis not present

## 2022-08-20 LAB — RAD ONC ARIA SESSION SUMMARY
Course Elapsed Days: 0
Plan Fractions Treated to Date: 1
Plan Prescribed Dose Per Fraction: 2 Gy
Plan Total Fractions Prescribed: 35
Plan Total Prescribed Dose: 70 Gy
Reference Point Dosage Given to Date: 2 Gy
Reference Point Session Dosage Given: 2 Gy
Session Number: 1

## 2022-08-20 MED ORDER — SONAFINE EX EMUL
1.0000 | Freq: Two times a day (BID) | CUTANEOUS | Status: DC
  Administered 2022-08-20: 1 via TOPICAL

## 2022-08-20 NOTE — Consult Note (Signed)
Chief Complaint: Patient was seen in consultation today for Port-A-Cath and gastrostomy tube placements  Referring Physician(s): Squire,Sarah/Iruku,P  Supervising Physician: Mir, Mauri Reading  Patient Status: [facility] - Out-pt  History of Present Illness: Nichole Dominguez is a 67 y.o. female, ex-smoker, with past medical history of GERD, plantar fasciitis who presents now with newly diagnosed squamous cell carcinoma of head and neck.  She is scheduled today for Port-A-Cath and gastrostomy tube placements prior to planned chemoradiation.  Past Medical History:  Diagnosis Date   Allergy    GERD (gastroesophageal reflux disease)    Osteopenia 01/04/2020   Plantar fasciitis    Seasonal allergies     Past Surgical History:  Procedure Laterality Date   COLONOSCOPY  2011   MOUTH SURGERY  01/2020   implant inserted   TUBAL LIGATION  1992    Allergies: Amlodipine, Darvon [propoxyphene], and Sudafed [pseudoephedrine hcl]  Medications: Prior to Admission medications   Medication Sig Start Date End Date Taking? Authorizing Provider  augmented betamethasone dipropionate (DIPROLENE-AF) 0.05 % ointment Apply topically 2 (two) times daily. 08/23/21   [name], NP  Calcium Carbonate (CALCIUM 600 HIGH POTENCY PO) Take 3 capsules by mouth daily. Patient not taking: Reported on 08/09/2022    [provider]  cholecalciferol (VITAMIN D3) 25 MCG (1000 UNIT) tablet Take 1,000 Units by mouth daily. Patient not taking: Reported on 08/09/2022    [provider]  dexamethasone (DECADRON) 4 MG tablet Take 2 tablets (8 mg) by mouth daily x 3 days starting the day after cisplatin chemotherapy. Take with food. 08/17/22   Rachel Moulds, MD  diphenhydrAMINE (BENADRYL) 25 mg capsule Take 25 mg by mouth at bedtime as needed.  Patient not taking: Reported on 08/09/2022    [provider]  fluticasone (FLONASE) 50 MCG/ACT nasal spray USE 2 SPRAYS IN EACH NOSTRIL DAILY 11/30/19    [name], NP  gabapentin (NEURONTIN) 300 MG capsule Take 1 capsule (300 mg total) by mouth 4 (four) times daily. 08/10/22   Lonie Peak, MD  lidocaine-prilocaine (EMLA) cream Apply to affected area once 08/17/22   Rachel Moulds, MD  loratadine (CLARITIN) 10 MG tablet Take 10 mg by mouth daily. Patient not taking: Reported on 08/09/2022    [provider]  losartan (COZAAR) 50 MG tablet Take 1 tablet (50 mg total) by mouth daily. 06/19/22   [name], NP  Multiple Vitamins-Minerals (MULTIVITAMIN WITH MINERALS) tablet Take 1 tablet by mouth daily. Patient not taking: Reported on 08/09/2022    [provider]  omeprazole (PRILOSEC) 20 MG capsule TAKE 1 CAPSULE (20 MG TOTAL) BY MOUTH EVERY MORNING. 02/07/22   [name], NP  ondansetron (ZOFRAN) 8 MG tablet Take 1 tablet (8 mg total) by mouth every 8 (eight) hours as needed for nausea or vomiting. Start on the third day after cisplatin. 08/17/22   Rachel Moulds, MD  prochlorperazine (COMPAZINE) 10 MG tablet Take 1 tablet (10 mg total) by mouth every 6 (six) hours as needed (Nausea or vomiting). 08/17/22   Rachel Moulds, MD  Zinc 50 MG TABS Take 1 tablet by mouth daily as needed. Patient not taking: Reported on 08/09/2022    [provider]     Family History  Problem Relation Age of Onset   Diabetes Mother    Heart disease Mother        chf   Hypertension Mother    Cervical cancer Mother    Obesity Mother    Colon polyps Mother  Thyroid cancer Father    Cancer Father        thyroid x2   Heart attack Father    Breast cancer Sister    Lupus Sister    Obesity Maternal Grandmother    Diabetes Maternal Grandmother    Heart disease Maternal Grandmother    Hypertension Maternal Grandmother    Stroke Maternal Grandmother    Diabetes Mellitus II Paternal Grandmother        died from diabetic coma   Cancer Paternal Grandfather        not sure what type   Heart attack Son    Esophageal  cancer Neg Hx    Stomach cancer Neg Hx    Rectal cancer Neg Hx    Colon cancer Neg Hx     Social History   Socioeconomic History   Marital status: Married    Spouse name: Not on file   Number of children: Not on file   Years of education: Not on file   Highest education level: Not on file  Occupational History   Not on file  Tobacco Use   Smoking status: Former    Packs/day: 1.00    Years: 24.00    Additional pack years: 0.00    Total pack years: 24.00    Types: Cigarettes    Quit date: 10/10/1996    Years since quitting: 25.8   Smokeless tobacco: Never  Vaping Use   Vaping Use: Never used  Substance and Sexual Activity   Alcohol use: Yes    Alcohol/week: 0.0 standard drinks of alcohol    Comment: 1 shot vodka daily   Drug use: No   Sexual activity: Yes    Partners: Male  Other Topics Concern   Not on file  Social History Narrative   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    Lives with husband   1 daughter in [place], 1 grandchildren   Son died   2 grown stepchildren- both Designer, multimedia, grandchildren   Enjoys Corporate investment banker   Social Determinants of Corporate investment banker Strain: Not on file  Food Insecurity: No Food Insecurity (08/09/2022)   Hunger Vital Sign    Worried About Running Out of Food in the Last Year: Never true    Ran Out of Food in the Last Year: Never true  Transportation Needs: No Transportation Needs (08/09/2022)   PRAPARE - Administrator, Civil Service (Medical): No    Lack of Transportation (Non-Medical): No  Physical Activity: Not on file  Stress: Not on file  Social Connections: Not on file      Review of Systems denies fever, CP, dyspnea, abd/back pain,N/V or bleeding ; she does have HA, rt neck pain, occ cough, dysphagia  Vital Signs:temp 98.9  BP 133/81  HR 90  R 18  O2 SATS 95% RA     Code Status: FULL CODE    Physical Exam: awake/alert; chest- CTA bilat; heart- RRR; abd- soft,+BS,NT; no LE edema; rt  neck mass  noted, tender to palpation  Imaging: No results found.  Labs:  CBC: Recent Labs    08/17/22 1044  WBC 8.3  HGB 14.2  HCT 40.0  PLT 419*    COAGS: No results for input(s): "INR", "APTT" in the last 8760 hours.  BMP: Recent Labs    10/17/21 1352 03/27/22 1135 08/10/22 1247 08/17/22 1044  NA 138 137  --  140  K 4.2 4.5  --  4.6  CL  104 101  --  105  CO2 28 27  --  28  GLUCOSE 87 103*  --  101*  BUN 15 15 17 17   CALCIUM 9.2 9.8  --  9.0  CREATININE 1.07 0.93 0.94 0.98  GFRNONAA  --   --  >60 >60    LIVER FUNCTION TESTS: No results for input(s): "BILITOT", "AST", "ALT", "ALKPHOS", "PROT", "ALBUMIN" in the last 8760 hours.  TUMOR MARKERS: No results for input(s): "AFPTM", "CEA", "CA199", "CHROMGRNA" in the last 8760 hours.  Assessment and Plan: 67 y.o. female, ex-smoker, with past medical history of GERD, plantar fasciitis who presents now with newly diagnosed squamous cell carcinoma of head and neck.  She is scheduled today for Port-A-Cath and gastrostomy tube placements prior to planned chemoradiation.Risks and benefits of image guided port-a-catheter placement was discussed with the patient including, but not limited to bleeding, infection, pneumothorax, or fibrin sheath development and need for additional procedures.  All of the patient's questions were answered, patient is agreeable to proceed. Consent signed and in chart.    Thank you for this interesting consult.  I greatly enjoyed meeting Nichole Dominguez and look forward to participating in their care.  A copy of this report was sent to the requesting provider on this date.  Electronically Signed: D. Jeananne Rama, PA-C 08/20/2022, 1:19 PM   I spent a total of  30 minutes   in face to face in clinical consultation, greater than 50% of which was counseling/coordinating care for Port-A-Cath and gastrostomy tube placements

## 2022-08-20 NOTE — Progress Notes (Signed)
Chemotherapy plan signed.  Amyri Frenz

## 2022-08-20 NOTE — Progress Notes (Addendum)
Pt here for patient teaching. Pt given Radiation and You booklet, Managing Acute Radiation Side Effects for Head and Neck Cancer handout, skin care instructions, and Sonafine.  Reviewed areas of pertinence such as fatigue, hair loss, mouth changes, nausea and vomiting, skin changes, throat changes, headache, blurry vision, cough, earaches, and taste changes. Pt able to give teach back of to pat skin, use unscented/gentle soap, and drink plenty of water, apply Sonafine bid, avoid applying anything to skin within 4 hours of treatment, and to use an electric razor if they must shave. Pt verbalizes understanding of information given and will contact nursing with any questions or concerns.     [email or web address]/whattoexpect/index   Ruel Favors, LPN

## 2022-08-20 NOTE — Addendum Note (Signed)
Encounter addended by: Lonie Peak, MD on: 08/20/2022 9:26 AM  Actions taken: Clinical Note Signed

## 2022-08-21 ENCOUNTER — Other Ambulatory Visit: Payer: Self-pay | Admitting: Radiation Oncology

## 2022-08-21 ENCOUNTER — Other Ambulatory Visit: Payer: Self-pay

## 2022-08-21 ENCOUNTER — Ambulatory Visit (HOSPITAL_COMMUNITY)
Admission: RE | Admit: 2022-08-21 | Discharge: 2022-08-21 | Disposition: A | Discharge status: Home / Self Care | Payer: 59 | Source: Ambulatory Visit | Attending: Radiation Oncology | Admitting: Radiation Oncology

## 2022-08-21 ENCOUNTER — Other Ambulatory Visit (HOSPITAL_COMMUNITY): Payer: Self-pay | Admitting: Interventional Radiology

## 2022-08-21 ENCOUNTER — Ambulatory Visit
Admission: RE | Admit: 2022-08-21 | Discharge: 2022-08-21 | Disposition: A | Discharge status: Home / Self Care | Payer: 59 | Source: Ambulatory Visit | Attending: Radiation Oncology | Admitting: Radiation Oncology

## 2022-08-21 ENCOUNTER — Encounter (HOSPITAL_COMMUNITY): Payer: Self-pay

## 2022-08-21 DIAGNOSIS — C140 Malignant neoplasm of pharynx, unspecified: Secondary | ICD-10-CM | POA: Diagnosis not present

## 2022-08-21 DIAGNOSIS — C760 Malignant neoplasm of head, face and neck: Secondary | ICD-10-CM

## 2022-08-21 DIAGNOSIS — Z803 Family history of malignant neoplasm of breast: Secondary | ICD-10-CM | POA: Insufficient documentation

## 2022-08-21 DIAGNOSIS — R449 Unspecified symptoms and signs involving general sensations and perceptions: Secondary | ICD-10-CM | POA: Diagnosis not present

## 2022-08-21 DIAGNOSIS — Z87891 Personal history of nicotine dependence: Secondary | ICD-10-CM | POA: Insufficient documentation

## 2022-08-21 DIAGNOSIS — Z808 Family history of malignant neoplasm of other organs or systems: Secondary | ICD-10-CM | POA: Insufficient documentation

## 2022-08-21 DIAGNOSIS — Z8049 Family history of malignant neoplasm of other genital organs: Secondary | ICD-10-CM | POA: Insufficient documentation

## 2022-08-21 DIAGNOSIS — Z5111 Encounter for antineoplastic chemotherapy: Secondary | ICD-10-CM | POA: Diagnosis not present

## 2022-08-21 DIAGNOSIS — Z510 Encounter for antineoplastic radiation therapy: Secondary | ICD-10-CM | POA: Diagnosis not present

## 2022-08-21 DIAGNOSIS — Z431 Encounter for attention to gastrostomy: Secondary | ICD-10-CM | POA: Diagnosis not present

## 2022-08-21 DIAGNOSIS — C770 Secondary and unspecified malignant neoplasm of lymph nodes of head, face and neck: Secondary | ICD-10-CM | POA: Diagnosis not present

## 2022-08-21 HISTORY — PX: IR IMAGING GUIDED PORT INSERTION: IMG5740

## 2022-08-21 HISTORY — PX: IR GASTROSTOMY TUBE MOD SED: IMG625

## 2022-08-21 LAB — CBC WITH DIFFERENTIAL/PLATELET
Abs Immature Granulocytes: 0.02 10*3/uL (ref 0.00–0.07)
Basophils Absolute: 0.1 10*3/uL (ref 0.0–0.1)
Basophils Relative: 1 %
Eosinophils Absolute: 0.2 10*3/uL (ref 0.0–0.5)
Eosinophils Relative: 2 %
HCT: 40.9 % (ref 36.0–46.0)
Hemoglobin: 13.8 g/dL (ref 12.0–15.0)
Immature Granulocytes: 0 %
Lymphocytes Relative: 19 %
Lymphs Abs: 1.4 10*3/uL (ref 0.7–4.0)
MCH: 32 pg (ref 26.0–34.0)
MCHC: 33.7 g/dL (ref 30.0–36.0)
MCV: 94.9 fL (ref 80.0–100.0)
Monocytes Absolute: 0.6 10*3/uL (ref 0.1–1.0)
Monocytes Relative: 7 %
Neutro Abs: 5.4 10*3/uL (ref 1.7–7.7)
Neutrophils Relative %: 71 %
Platelets: 359 10*3/uL (ref 150–400)
RBC: 4.31 MIL/uL (ref 3.87–5.11)
RDW: 12.6 % (ref 11.5–15.5)
WBC: 7.7 10*3/uL (ref 4.0–10.5)
nRBC: 0 % (ref 0.0–0.2)

## 2022-08-21 LAB — RAD ONC ARIA SESSION SUMMARY
Course Elapsed Days: 1
Plan Fractions Treated to Date: 2
Plan Prescribed Dose Per Fraction: 2 Gy
Plan Total Fractions Prescribed: 35
Plan Total Prescribed Dose: 70 Gy
Reference Point Dosage Given to Date: 4 Gy
Reference Point Session Dosage Given: 2 Gy
Session Number: 2

## 2022-08-21 LAB — BASIC METABOLIC PANEL
Anion gap: 10 (ref 5–15)
BUN: 16 mg/dL (ref 8–23)
CO2: 24 mmol/L (ref 22–32)
Calcium: 9.4 mg/dL (ref 8.9–10.3)
Chloride: 102 mmol/L (ref 98–111)
Creatinine, Ser: 0.84 mg/dL (ref 0.44–1.00)
GFR, Estimated: >60 mL/min (ref >60)
Glucose, Bld: 125 mg/dL — ABNORMAL HIGH (ref 70–99)
Potassium: 4.2 mmol/L (ref 3.5–5.1)
Sodium: 136 mmol/L (ref 135–145)

## 2022-08-21 LAB — PROTIME-INR
INR: 1 (ref 0.8–1.2)
Prothrombin Time: 13 seconds (ref 11.4–15.2)

## 2022-08-21 MED ORDER — GABAPENTIN 300 MG PO CAPS
300.0000 mg | ORAL_CAPSULE | Freq: Once | ORAL | Status: AC
  Administered 2022-08-21: 300 mg via ORAL
  Filled 2022-08-21: qty 1

## 2022-08-21 MED ORDER — FENTANYL CITRATE (PF) 100 MCG/2ML IJ SOLN
INTRAMUSCULAR | Status: AC | PRN
  Administered 2022-08-21 (×2): 50 ug via INTRAVENOUS

## 2022-08-21 MED ORDER — LIDOCAINE VISCOUS HCL 2 % MT SOLN
OROMUCOSAL | Status: AC
  Filled 2022-08-21: qty 15

## 2022-08-21 MED ORDER — MIDAZOLAM HCL 2 MG/2ML IJ SOLN
INTRAMUSCULAR | Status: AC | PRN
  Administered 2022-08-21 (×2): 1 mg via INTRAVENOUS

## 2022-08-21 MED ORDER — CEFAZOLIN SODIUM-DEXTROSE 2-4 GM/100ML-% IV SOLN
INTRAVENOUS | Status: AC
  Filled 2022-08-21: qty 100

## 2022-08-21 MED ORDER — FENTANYL CITRATE (PF) 100 MCG/2ML IJ SOLN
INTRAMUSCULAR | Status: AC
  Filled 2022-08-21: qty 4

## 2022-08-21 MED ORDER — LIDOCAINE VISCOUS HCL 2 % MT SOLN
15.0000 mL | Freq: Once | OROMUCOSAL | Status: AC
  Administered 2022-08-21: 15 mL via ORAL

## 2022-08-21 MED ORDER — FLUMAZENIL 0.5 MG/5ML IV SOLN
INTRAVENOUS | Status: AC
  Filled 2022-08-21: qty 5

## 2022-08-21 MED ORDER — LIDOCAINE-EPINEPHRINE 1 %-1:100000 IJ SOLN
INTRAMUSCULAR | Status: AC
  Filled 2022-08-21: qty 2

## 2022-08-21 MED ORDER — CEFAZOLIN SODIUM-DEXTROSE 2-4 GM/100ML-% IV SOLN
2.0000 g | Freq: Once | INTRAVENOUS | Status: AC
  Administered 2022-08-21: 2 g via INTRAVENOUS
  Filled 2022-08-21: qty 100

## 2022-08-21 MED ORDER — GLUCAGON HCL (RDNA) 1 MG IJ SOLR
INTRAMUSCULAR | Status: AC | PRN
  Administered 2022-08-21: 1 mg via INTRAVENOUS

## 2022-08-21 MED ORDER — GLUCAGON HCL RDNA (DIAGNOSTIC) 1 MG IJ SOLR
INTRAMUSCULAR | Status: AC
  Filled 2022-08-21: qty 1

## 2022-08-21 MED ORDER — LIDOCAINE-EPINEPHRINE 1 %-1:100000 IJ SOLN
INTRAMUSCULAR | Status: AC
  Filled 2022-08-21: qty 1

## 2022-08-21 MED ORDER — LIDOCAINE-EPINEPHRINE 1 %-1:100000 IJ SOLN
20.0000 mL | Freq: Once | INTRAMUSCULAR | Status: AC
  Administered 2022-08-21: 10 mL

## 2022-08-21 MED ORDER — SODIUM CHLORIDE 0.9 % IV SOLN: INTRAVENOUS | Status: DC

## 2022-08-21 MED ORDER — HEPARIN SOD (PORK) LOCK FLUSH 100 UNIT/ML IV SOLN
500.0000 [IU] | Freq: Once | INTRAVENOUS | Status: AC
  Administered 2022-08-21: 500 [IU]

## 2022-08-21 MED ORDER — MIDAZOLAM HCL 2 MG/2ML IJ SOLN
INTRAMUSCULAR | Status: AC
  Filled 2022-08-21: qty 4

## 2022-08-21 MED ORDER — GABAPENTIN 300 MG PO CAPS
ORAL_CAPSULE | ORAL | Status: AC
  Filled 2022-08-21: qty 1

## 2022-08-21 MED ORDER — HEPARIN SOD (PORK) LOCK FLUSH 100 UNIT/ML IV SOLN
INTRAVENOUS | Status: AC
  Filled 2022-08-21: qty 5

## 2022-08-21 MED ORDER — HYDROCODONE-ACETAMINOPHEN 7.5-325 MG/15ML PO SOLN
10.0000 mL | ORAL | 0 refills | Status: DC | PRN
Start: 2022-08-21 — End: 2022-10-05

## 2022-08-21 MED ORDER — LIDOCAINE-EPINEPHRINE 1 %-1:100000 IJ SOLN
20.0000 mL | Freq: Once | INTRAMUSCULAR | Status: AC
  Administered 2022-08-21: 20 mL

## 2022-08-21 MED ORDER — IOHEXOL 300 MG/ML  SOLN
50.0000 mL | Freq: Once | INTRAMUSCULAR | Status: AC | PRN
  Administered 2022-08-21: 15 mL

## 2022-08-21 MED ORDER — NALOXONE HCL 0.4 MG/ML IJ SOLN
INTRAMUSCULAR | Status: AC
  Filled 2022-08-21: qty 1

## 2022-08-21 NOTE — Sedation Documentation (Signed)
Please see requisition for port placement for time out and pre procedure check list

## 2022-08-21 NOTE — Progress Notes (Signed)
Oncology Nurse Navigator Documentation   To provide support, encouragement and care continuity, met with Ms. Grawe before her initial RT yesterday.  She was accompanied by her husband.  I reviewed the 2-step treatment process, answered questions.  Ms. Dierking completed treatment without difficulty, denied questions/concerns. I reviewed the registration/arrival procedure for subsequent treatments. I encouraged them to call me with questions/concerns as treatments proceed.   [name] RN, BSN, OCN Head & Neck Oncology Nurse Navigator [facility] at [facility] Phone # [phone]  Fax # [phone]

## 2022-08-21 NOTE — Procedures (Signed)
Interventional Radiology Procedure Note  Procedure:  Right Chest port G tube placement  Indication: Head and Neck Ca  Findings: Please refer to procedural dictation for full description.  Complications: None  EBL: < 10 mL  Acquanetta Belling, MD [phone]

## 2022-08-21 NOTE — Therapy (Signed)
OUTPATIENT PHYSICAL THERAPY HEAD AND NECK BASELINE EVALUATION   Patient Name: Nichole Dominguez MRN: [id number] DOB:1955-08-14, 67 y.o., female Today's Date: 08/23/2022  END OF SESSION:  PT End of Session - 08/23/22 0959     Visit Number 1    Number of Visits 2    Date for PT Re-Evaluation 11/01/22    PT Start Time 0934    PT Stop Time 0956    PT Time Calculation (min) 22 min    Activity Tolerance Patient tolerated treatment well    Behavior During Therapy Nichole Dominguez for tasks assessed/performed             Past Medical History:  Diagnosis Date   Allergy    GERD (gastroesophageal reflux disease)    Osteopenia 01/04/2020   Plantar fasciitis    Seasonal allergies    Past Surgical History:  Procedure Laterality Date   COLONOSCOPY  2011   IR GASTROSTOMY TUBE MOD SED  08/21/2022   IR IMAGING GUIDED PORT INSERTION  08/21/2022   MOUTH SURGERY  01/2020   implant inserted   TUBAL LIGATION  1992   Patient Active Problem List   Diagnosis Date Noted   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   Lower extremity edema 10/03/2021   Hypertension 08/23/2021   Neck mass 08/23/2021   Eczema 12/01/2020   Osteopenia 01/04/2020   Pap smear for cervical cancer screening 10/06/2014   Thyroid nodule 10/06/2014   Routine general medical examination at a health care facility 08/21/2013    PCP: Nichole Craze, NP  REFERRING PROVIDER: Lonie Peak, MD  REFERRING DIAG: C77.0 (ICD-10-CM) - Secondary malignant neoplasm of cervical lymph node (HCC)   THERAPY DIAG:  Abnormal posture  Secondary malignant neoplasm of cervical lymph node (HCC)  Rationale for Evaluation and Treatment: Rehabilitation  ONSET DATE: 07/27/22  SUBJECTIVE:     SUBJECTIVE STATEMENT: Patient reports they are here today to be seen by their medical team for newly diagnosed secondary neoplasm of cervical lymph node.    PERTINENT HISTORY:  Secondary malignant neoplasm of cervical lymph node,  Stage I (T0,N1, M0, p 16 +), staging does not acknowledge the bulky and infiltrative nature of her dominant neck mass. 05/07/22 She saw Nichole Dominguez and laryngoscopy was performed which revealed a relatively indurated mass lesion extending from the mid parotid gland to level II in the neck measuring approx. 4 cm. Biopsy of the parotid mass was non diagnostic. 06/26/22 CT neck completed after she began to develop jaw pain and right neck pain. 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 The mass appeared to encase a portion of the right external carotid artery and partially encases the right internal carotid artery Prominent right-sided cervical and upper mediastinal lymph nodes were also appreciated, suspicious for metastatic spread of disease. 07/17/22 She saw Nichole Dominguez who did a Laryngoscopy performed during this visit was notable for mild right pharyngeal fullness, mild right vocal fold hypomobility, and limited anterior movement of the right tongue. Based on CT findings, Nichole Dominguez recommended proceeding with repeat IR guided biopsy of the mass, as well as staging work-up including an MRI of the neck and PET scan. 07/27/22 MRI of the face showed the infiltrative mass lesion centered in the right carotid, parapharyngeal and parotid spaces with extensive surrounding local invasion, and  findings concerning for metastatic adenopathy in the right neck and additional nonspecific mildly enlarged and rounded lymph nodes involving the upper mediastinum and contralateral left jugular chain. MRI also showed evidence of tumor extension within close proximity to multiple skill base foramina, along with evidence of right hypoglossal nerve involvement contributing to right hemitongue denervation changes, and tumor thrombus within the encased right internal  jugular vein. MRI otherwise showed no evidence of intracranial perineural tumor spread. 07/27/22 FNA of the right neck mass collected on 07/27/22 showed carcinoma with squamous features, favoring squamous cell carcinoma (however, other tumors with squamous differentiation cannot entirely be excluded); P16 diffusely positive; HPV focally positive. 08/02/22 PET demonstrated  the dominant right level IIa metastatic nodal mass measuring 4.9 x 2.5 cm with an SUV max of 15; multiple ipsilateral nodal metastases involving the right level 3, level 4, and level 5 lymph node stations including the dominant right level 3 nodal metastasis (measuring 1.1 cm) coming in contact with the right oropharynx without discrete primary tumor; FDG avidity within the left palatine tonsil (normal in size and with a max SUV of 4.4); and mildly FDG avid mediastinal, bihilar, and subcarinal lymph nodes. No other sites concerning for metastatic disease were appreciated elsewhere in the body. Given that there is encasement of the carotid, surgical resection was not advised by Nichole Dominguez and chemo/radiation was recommended. She will receive 35 fractions of radiation to her Oropharynx and bilateral neck with weekly cisplatin. She started radiation on 08/20/22 and will start chemo on 08/24/22 and will complete 10/08/22. 08/21/22 PEG/PAC  PATIENT GOALS:   to be educated about the signs and symptoms of lymphedema and learn post op HEP.   PAIN:  Are you having pain? Yes: NPRS scale: 5/10 Pain location: neck - R side Pain description: ache, dull Aggravating factors: nothing Relieving factors: nothing  PRECAUTIONS: Active CA  WEIGHT BEARING RESTRICTIONS: No  FALLS:  Has patient fallen in last 6 months? No Does the patient have a fear of falling that limits activity? No Is the patient reluctant to leave the house due to a fear of falling?No  LIVING ENVIRONMENT: Patient lives with: husband Nichole Dominguez Lives in: House/apartment Has following  equipment at home: None  OCCUPATION: retired  LEISURE: pt does not exercise  PRIOR LEVEL OF FUNCTION: Independent   OBJECTIVE:  COGNITION: Overall cognitive status: Within functional limits for tasks assessed                  POSTURE:  Forward head and rounded shoulders posture  30 SEC SIT TO STAND: 14 reps in 30 sec without use of UEs which is  Average for patient's age  SHOULDER AROM:   Impaired  L shoulder ROM is WFL, R shoulder ROM is limited to about 90 degrees of flexion and approx 45 degrees of abduction, pt reports she thinks it may be from the tumor. She also reports she felt like she tore something in her shoulder and reports she could hear it and it was painful then.    CERVICAL AROM:   Percent limited  Flexion WFL  Extension WFL  Right lateral flexion WFL  Left lateral flexion WFL  Right rotation 30% limited  Left rotation WFL    (Blank rows=not tested)  GAIT: Assessed: Yes Assistance needed: Independent Ambulation Distance: 15 feet Assistive Device: none Gait pattern: WFL Ambulation surface: Level  PATIENT EDUCATION:  Education details: Neck ROM, importance of posture when sitting, standing and lying down, deep breathing, walking program and importance  of staying active throughout treatment, CURE article on staying active, "Why exercise?" flyer, lymphedema and PT info Person educated: Patient Education method: Explanation, Demonstration, Handout Education comprehension: Patient verbalized understanding and returned demonstration  HOME EXERCISE PROGRAM: Patient was instructed today in a home exercise program today for head and neck range of motion exercises. These included active cervical flexion, active cervical extension, active cervical rotation to each direction, upper trap stretch, and shoulder retraction. Patient was encouraged to do these 2-3 times a day, holding for 5 sec each and completing for 5 reps. Pt was educated that once this becomes easier  then hold the stretches for 30-60 seconds.    ASSESSMENT:  CLINICAL IMPRESSION: Pt arrives to PT with recently diagnosed secondary neoplasm of cervical lymph node. She will receive 35 fractions of radiation to her Oropharynx and bilateral neck with weekly cisplatin. She started radiation on 08/20/22 and will start chemo on 08/24/22 and will complete 10/08/22.  Pt's cervical ROM was [facility] except for R rotation which was limited by the tumor. Her R shoulder ROM is limited greatly. Pt had an injury a few months ago when doing a motion with extension and IR. Educated pt about signs and symptoms of lymphedema as well as anatomy and physiology of lymphatic system. Educated pt in importance of staying as active as possible throughout treatment to decrease fatigue as well as head and neck ROM exercises to decrease loss of ROM. Will see pt after completion of radiation to reassess ROM and assess for lymphedema and to determine therapy needs at that time.  Pt will benefit from skilled therapeutic intervention to improve on the following deficits: Decreased knowledge of precautions and postural dysfunction. Other deficits: decreased ROM  PT treatment/interventions: ADL/self-care home management, pt/family education, therapeutic exercise.   REHAB POTENTIAL: Good  CLINICAL DECISION MAKING: Stable/uncomplicated  EVALUATION COMPLEXITY: Low   GOALS: Goals reviewed with patient? YES  LONG TERM GOALS: (STG=LTG)   Name Target Date  Goal status  1 Patient will be able to verbalize understanding of a home exercise program for cervical range of motion, posture, and walking.   Baseline:  No knowledge 08/21/2022 Achieved at eval  2 Patient will be able to verbalize understanding of proper sitting and standing posture. Baseline:  No knowledge 08/21/2022 Achieved at eval  3 Patient will be able to verbalize understanding of lymphedema risk and availability of treatment for this condition Baseline:  No knowledge  08/21/2022 Achieved at eval  4 Pt will demonstrate a return to full cervical ROM and function post operatively compared to baselines and not demonstrate any signs or symptoms of lymphedema.  Baseline: See objective measurements taken today. 11/01/22 New    PLAN:  PT FREQUENCY/DURATION: EVAL and 1 follow up appointment.   PLAN FOR NEXT SESSION: will reassess 2 weeks after completion of radiation to determine needs, reassess L shoulder ROM.  Patient will follow up at outpatient cancer rehab 2 weeks after completion of radiation.  If the patient requires physical therapy at that time, a specific plan will be dictated and sent to the referring physician for approval. The patient was educated today on appropriate basic range of motion exercises to begin now and continue throughout radiation and educated on the signs and symptoms of lymphedema. Patient verbalized good understanding.     Physical Therapy Information for During and After Head/Neck Cancer Treatment: Lymphedema is a swelling condition that you may be at risk for in your neck and/or face if you have radiation treatment to the  area and/or if you have surgery that includes removing lymph nodes.  There is treatment available for this condition and it is not life-threatening.  Contact your physician or physical therapist with concerns. An excellent resource for those seeking information on lymphedema is the National Lymphedema Network's website.  It can be accessed at www.lymphnet.org If you notice swelling in your neck or face at any time following surgery (even if it is many years from now), please contact your doctor or physical therapist to discuss this.  Lymphedema can be treated at any time but it is easier for you if it is treated early on. If you have had surgery to your neck, please check with your surgeon about how soon to start doing neck range of motion exercises.  If you are not having surgery, I encourage you to start doing neck  range of motion exercises today and continue these while undergoing treatment, UNLESS you have irritation of your skin or soft tissue that is aggravated by doing them.  These exercises are intended to help you prevent loss of range of motion and/or to gain range of motion in your neck (which can be limited by tightening effects of radiation), and NOT to aggravate these tissues if they develop sensitivities from treatment. Neck range of motion exercises should be done to the point of feeling a GENTLE, TOLERABLE stretch only.  You are encouraged to start a walking or other exercise program tomorrow and continue this as much as you are able through and after treatment.  Please feel free to call me with any questions. Leonette Most, PT, CLT Physical Therapist and Certified Lymphedema Therapist [facility] [address], [place], [place] [address] [phone] [email or web address]  WALKING  Walking is a great form of exercise to increase your strength, endurance and overall fitness.  A walking program can help you start slowly and gradually build endurance as you go.  Everyone's ability is different, so each person's starting point will be different.  You do not have to follow them exactly.  The are just samples. You should simply find out what's right for you and stick to that program.   In the beginning, you'll start off walking 2-3 times a day for short distances.  As you get stronger, you'll be walking further at just 1-2 times per day.  A. You Can Walk For A Certain Length Of Time Each Day    Walk 5 minutes 3 times per day.  Increase 2 minutes every 2 days (3 times per day).  Work up to 25-30 minutes (1-2 times per day).   Example:   Day 1-2 5 minutes 3 times per day   Day 7-8 12 minutes 2-3 times per day   Day 13-14 25 minutes 1-2 times per day  B. You Can Walk For a Certain Distance Each Day     Distance can be substituted for  time.    Example:   3 trips to mailbox (at road)   3 trips to corner of block   3 trips around the block  C. Go to local high school and use the track.    Walk for distance ____ around track  Or time ____ minutes  D. Walk ____ Jog ____ Run ___   Why exercise?  So many benefits! Here are SOME of them: Heart health, including raising your good cholesterol level and reducing heart rate and blood pressure Lung health, including improved lung capacity It burns fats, and most  of [place] can stand to be leaner, whether or not we are overweight. It increases the body's natural painkillers and mood elevators, so makes you feel better. Not only makes you feel better, but look better too Improves sleep Takes a bite out of stress May decrease your risk of many types of cancer If you are currently undergoing cancer treatment, exercise may improve your ability to tolerate treatments including chemotherapy. For everybody, it can improve your energy level. Those with cancer-related fatigue report a 40-50% reduction in this symptom when exercising regularly. If you are a survivor of breast, colon, or prostate cancer, it may decrease your risk of a recurrence. (This may hold for other cancers too, but so far we have data just for these three types.)  How to exercise: Get your doctor's okay. Pick something you enjoy doing, like walking, Zumba, biking, swimming, or whatever. Start at low intensity and time, then gradually increase.  (See walking program handout.) Set a goal to achieve over time.  The American Cancer Society, American Heart Association, and U.S. Dept. of Health and Human Services recommend 150 minutes of moderate exercise, 75 minutes of vigorous exercise, or a combination of both per week. This should be done in episodes at least 10 minutes long, spread throughout the week.  Need help being motivated? Pick something you enjoy doing, because you'll be more inclined to stick with that  activity than something that feels like a chore. Do it with a friend so that you are accountable to each other. Schedule it into your day. Place it on your calendar and keep that appointment just like you do any appointment that you make. Join an exercise group that meets at a specific time.  That way, you have to show up on time, and that makes it harder to procrastinate about doing your workout.  It also keeps you accountable--people begin to expect you to be there. Join a gym where you feel comfortable and not intimidated, at the right cost. Sign up for something that you'll need to be in shape for on a specific date, like a 1K or a 5K to walk or run, a 20 or 30 mile bike ride, a mud run or something like that. If the date is looming, you know you'll need to train to be ready for it.  An added benefit is that many of these are fundraisers for good causes. If you've already paid for a gym membership, group exercise class or event, you might as well work out, so you haven't wasted your money!    [employer], PT 08/23/2022, 10:00 AM

## 2022-08-21 NOTE — Discharge Instructions (Addendum)
Please call Interventional Radiology clinic [phone] with any questions or concerns.  You may remove your dressing and shower tomorrow.  DO NOT use EMLA cream on your port site for 2 weeks as this cream will remove surgical glue on your incision. However, your port may be used immediately. An ice pack may be applied for 15 minutes prior to the port being accessed as an alternative to the EMLA cream.  Implanted Port Insertion, Care After This sheet gives you information about how to care for yourself after your procedure. Your health care provider may also give you more specific instructions. If you have problems or questions, contact your health care provider. What can I expect after the procedure? After the procedure, it is common to have: Discomfort at the port insertion site. Bruising on the skin over the port. This should improve over 3-4 days. Follow these instructions at home: [facility] care After your port is placed, you will get a manufacturer's information card. The card has information about your port. Keep this card with you at all times. Take care of the port as told by your health care provider. Ask your health care provider if you or a family member can get training for taking care of the port at home. A home health care nurse may also take care of the port. Make sure to remember what type of port you have. Incision care Follow instructions from your health care provider about how to take care of your port insertion site. Make sure you: Wash your hands with soap and water before and after you change your bandage (dressing). If soap and water are not available, use hand sanitizer. Change your dressing as told by your health care provider. Leave stitches (sutures), skin glue, or adhesive strips in place. These skin closures may need to stay in place for 2 weeks or longer. If adhesive strip edges start to loosen and curl up, you may trim the loose edges. Do not remove adhesive strips  completely unless your health care provider tells you to do that. Check your port insertion site every day for signs of infection. Check for: Redness, swelling, or pain. Fluid or blood. Warmth. Pus or a bad smell.        Activity Return to your normal activities as told by your health care provider. Ask your health care provider what activities are safe for you. Do not lift anything that is heavier than 10 lb (4.5 kg), or the limit that you are told, until your health care provider says that it is safe. General instructions Take over-the-counter and prescription medicines only as told by your health care provider. Do not take baths, swim, or use a hot tub until your health care provider approves. Ask your health care provider if you may take showers. You may only be allowed to take sponge baths. Do not drive for 24 hours if you were given a sedative during your procedure. Wear a medical alert bracelet in case of an emergency. This will tell any health care providers that you have a port. Keep all follow-up visits as told by your health care provider. This is important. Contact a health care provider if: You cannot flush your port with saline as directed, or you cannot draw blood from the port. You have a fever or chills. You have redness, swelling, or pain around your port insertion site. You have fluid or blood coming from your port insertion site. Your port insertion site feels warm to the touch. You  have pus or a bad smell coming from the port insertion site. Get help right away if: You have chest pain or shortness of breath. You have bleeding from your port that you cannot control. Summary Take care of the port as told by your health care provider. Keep the manufacturer's information card with you at all times. Change your dressing as told by your health care provider. Contact a health care provider if you have a fever or chills or if you have redness, swelling, or pain around  your port insertion site. Keep all follow-up visits as told by your health care provider. This information is not intended to replace advice given to you by your health care provider. Make sure you discuss any questions you have with your health care provider. Document Revised: 10/22/2017 Document Reviewed: 10/22/2017 Elsevier Patient Education  2021 Elsevier Inc.   Moderate Conscious Sedation, Adult, Care After This sheet gives you information about how to care for yourself after your procedure. Your health care provider may also give you more specific instructions. If you have problems or questions, contact your health care provider. What can I expect after the procedure? After the procedure, it is common to have: Sleepiness for several hours. Impaired judgment for several hours. Difficulty with balance. Vomiting if you eat too soon. Follow these instructions at home: For the time period you were told by your health care provider: Rest. Do not participate in activities where you could fall or become injured. Do not drive or use machinery. Do not drink alcohol. Do not take sleeping pills or medicines that cause drowsiness. Do not make important decisions or sign legal documents. Do not take care of children on your own.        Eating and drinking Follow the diet recommended by your health care provider. Drink enough fluid to keep your urine pale yellow. If you vomit: Drink water, juice, or soup when you can drink without vomiting. Make sure you have little or no nausea before eating solid foods.    General instructions Take over-the-counter and prescription medicines only as told by your health care provider. Have a responsible adult stay with you for the time you are told. It is important to have someone help care for you until you are awake and alert. Do not smoke. Keep all follow-up visits as told by your health care provider. This is important. Contact a health care  provider if: You are still sleepy or having trouble with balance after 24 hours. You feel light-headed. You keep feeling nauseous or you keep vomiting. You develop a rash. You have a fever. You have redness or swelling around the IV site. Get help right away if: You have trouble breathing. You have new-onset confusion at home. Summary After the procedure, it is common to feel sleepy, have impaired judgment, or feel nauseous if you eat too soon. Rest after you get home. Know the things you should not do after the procedure. Follow the diet recommended by your health care provider and drink enough fluid to keep your urine pale yellow. Get help right away if you have trouble breathing or new-onset confusion at home. This information is not intended to replace advice given to you by your health care provider. Make sure you discuss any questions you have with your health care provider. Document Revised: 07/24/2019 Document Reviewed: 02/19/2019 Elsevier Patient Education  2021 [employer].

## 2022-08-22 ENCOUNTER — Other Ambulatory Visit: Payer: Self-pay

## 2022-08-22 ENCOUNTER — Inpatient Hospital Stay: Payer: 59

## 2022-08-22 ENCOUNTER — Ambulatory Visit
Admission: RE | Admit: 2022-08-22 | Discharge: 2022-08-22 | Disposition: A | Discharge status: Home / Self Care | Payer: 59 | Source: Ambulatory Visit | Attending: Radiation Oncology | Admitting: Radiation Oncology

## 2022-08-22 DIAGNOSIS — C140 Malignant neoplasm of pharynx, unspecified: Secondary | ICD-10-CM | POA: Diagnosis not present

## 2022-08-22 DIAGNOSIS — R449 Unspecified symptoms and signs involving general sensations and perceptions: Secondary | ICD-10-CM | POA: Diagnosis not present

## 2022-08-22 DIAGNOSIS — C770 Secondary and unspecified malignant neoplasm of lymph nodes of head, face and neck: Secondary | ICD-10-CM | POA: Diagnosis not present

## 2022-08-22 DIAGNOSIS — Z5111 Encounter for antineoplastic chemotherapy: Secondary | ICD-10-CM | POA: Diagnosis not present

## 2022-08-22 DIAGNOSIS — Z87891 Personal history of nicotine dependence: Secondary | ICD-10-CM | POA: Diagnosis not present

## 2022-08-22 DIAGNOSIS — Z510 Encounter for antineoplastic radiation therapy: Secondary | ICD-10-CM | POA: Diagnosis not present

## 2022-08-22 LAB — RAD ONC ARIA SESSION SUMMARY
Course Elapsed Days: 2
Plan Fractions Treated to Date: 3
Plan Prescribed Dose Per Fraction: 2 Gy
Plan Total Fractions Prescribed: 35
Plan Total Prescribed Dose: 70 Gy
Reference Point Dosage Given to Date: 6 Gy
Reference Point Session Dosage Given: 2 Gy
Session Number: 3

## 2022-08-23 ENCOUNTER — Encounter: Payer: Self-pay | Admitting: Physical Therapy

## 2022-08-23 ENCOUNTER — Inpatient Hospital Stay: Payer: 59 | Admitting: Internal Medicine

## 2022-08-23 ENCOUNTER — Other Ambulatory Visit: Payer: Self-pay | Admitting: *Deleted

## 2022-08-23 ENCOUNTER — Ambulatory Visit: Payer: 59 | Attending: Radiation Oncology

## 2022-08-23 ENCOUNTER — Inpatient Hospital Stay: Payer: 59 | Admitting: Licensed Clinical Social Worker

## 2022-08-23 ENCOUNTER — Ambulatory Visit: Payer: 59 | Attending: Radiation Oncology | Admitting: Physical Therapy

## 2022-08-23 ENCOUNTER — Other Ambulatory Visit: Payer: Self-pay

## 2022-08-23 ENCOUNTER — Ambulatory Visit
Admission: RE | Admit: 2022-08-23 | Discharge: 2022-08-23 | Disposition: A | Discharge status: Home / Self Care | Payer: 59 | Source: Ambulatory Visit | Attending: Radiation Oncology | Admitting: Radiation Oncology

## 2022-08-23 VITALS — BP 134/77 | HR 103 | Temp 97.9°F | Resp 17 | Wt 155.1 lb

## 2022-08-23 DIAGNOSIS — Z87891 Personal history of nicotine dependence: Secondary | ICD-10-CM | POA: Diagnosis not present

## 2022-08-23 DIAGNOSIS — R1310 Dysphagia, unspecified: Secondary | ICD-10-CM

## 2022-08-23 DIAGNOSIS — C140 Malignant neoplasm of pharynx, unspecified: Secondary | ICD-10-CM

## 2022-08-23 DIAGNOSIS — G518 Other disorders of facial nerve: Secondary | ICD-10-CM | POA: Insufficient documentation

## 2022-08-23 DIAGNOSIS — R293 Abnormal posture: Secondary | ICD-10-CM | POA: Diagnosis not present

## 2022-08-23 DIAGNOSIS — R449 Unspecified symptoms and signs involving general sensations and perceptions: Secondary | ICD-10-CM | POA: Diagnosis not present

## 2022-08-23 DIAGNOSIS — C760 Malignant neoplasm of head, face and neck: Secondary | ICD-10-CM

## 2022-08-23 DIAGNOSIS — Z5111 Encounter for antineoplastic chemotherapy: Secondary | ICD-10-CM | POA: Diagnosis not present

## 2022-08-23 DIAGNOSIS — C770 Secondary and unspecified malignant neoplasm of lymph nodes of head, face and neck: Secondary | ICD-10-CM | POA: Insufficient documentation

## 2022-08-23 DIAGNOSIS — Z510 Encounter for antineoplastic radiation therapy: Secondary | ICD-10-CM | POA: Diagnosis not present

## 2022-08-23 LAB — RAD ONC ARIA SESSION SUMMARY
Course Elapsed Days: 3
Plan Fractions Treated to Date: 4
Plan Prescribed Dose Per Fraction: 2 Gy
Plan Total Fractions Prescribed: 35
Plan Total Prescribed Dose: 70 Gy
Reference Point Dosage Given to Date: 8 Gy
Reference Point Session Dosage Given: 2 Gy
Session Number: 4

## 2022-08-23 MED ORDER — GABAPENTIN 800 MG PO TABS: 800.0000 mg | ORAL_TABLET | Freq: Three times a day (TID) | ORAL | 2 refills | Status: DC

## 2022-08-23 MED ORDER — DULOXETINE HCL 30 MG PO CPEP: 30.0000 mg | ORAL_CAPSULE | Freq: Every day | ORAL | 1 refills | Status: DC

## 2022-08-23 MED FILL — Dexamethasone Sodium Phosphate Inj 100 MG/10ML: INTRAMUSCULAR | Qty: 1 | Status: AC

## 2022-08-23 MED FILL — Fosaprepitant Dimeglumine For IV Infusion 150 MG (Base Eq): INTRAVENOUS | Qty: 5 | Status: AC

## 2022-08-23 NOTE — Progress Notes (Signed)
Oncology Nurse Navigator Documentation   I met with Ms. Stangeland and her sister in law before her appointments with SLP and PT during head and neck MDC. She continues to report nausea after PEG placement 5/14 and she is taking compazine prn. She does know to call me if I can help her in anyway or if she has questions.   [name] RN, BSN, OCN Head & Neck Oncology Nurse Navigator [facility] Phone # [phone]  Fax # [phone]

## 2022-08-23 NOTE — Patient Instructions (Signed)
SWALLOWING EXERCISES Do these until 6 months after your last day of radiation, then 2 times per week afterwards  Effortful Swallows - Press your tongue against the roof of your mouth for 3 seconds, then squeeze the muscles in your neck while you swallow your saliva or a sip of water - Repeat 10-15 times, 2-3 times a day, and use whenever you eat or drink  [place] Swallow - swallow with your tongue sticking out - Stick tongue out past your lips and gently bite tongue with your teeth - Swallow, while holding your tongue with your teeth - Repeat 10-15 times, 2-3 times a day *use a wet spoon if your mouth gets dry*  Shaker Exercise - head lift - Lie flat on your back in your bed or on a couch without pillows - Raise your head and look at your feet - KEEP YOUR SHOULDERS DOWN - HOLD FOR 45-60 SECONDS, then lower your head back down - Repeat 3 times, 2-3 times a day  [employer] - "half swallow" exercise - Start to swallow, and keep your Adam's apple up by squeezing hard with the muscles of the throat - Hold the squeeze for 5-7 seconds and then relax - Repeat 10-15 times, 2-3 times a day *use a wet spoon if your mouth gets dry*       5.   "Super Swallow"  - Take a breath and hold it  - Swallow then IMMEDIATELY cough  - Repeat 10 times, 2-3 times a day     SWALLOWING EXERCISES Do these until 6 months after your last day of radiation, then 2-3 times per week afterwards  Effortful Swallows - Press your tongue against the roof of your mouth for 3 seconds, then squeeze the muscles in your neck while you swallow your saliva or a sip of water - Repeat 10-15 times, 2-3 times a day, and use whenever you eat or drink  [place] Swallow - swallow with your tongue sticking out - Stick tongue out past your lips and gently bite tongue with your teeth - Swallow, while holding your tongue with your teeth - Repeat 10-15 times, 2-3 times a day *use a wet spoon if your mouth gets dry*  Shaker  Exercise - head lift - Lie flat on your back in your bed or on a couch without pillows - Raise your head and look at your feet - KEEP YOUR SHOULDERS DOWN - HOLD FOR 45-60 SECONDS, then lower your head back down - Repeat 3 times, 2-3 times a day  [employer] - "half swallow" exercise - Start to swallow, and keep your Adam's apple up by squeezing hard with the muscles of the throat - Hold the squeeze for 5-7 seconds and then relax - Repeat 10-15 times, 2-3 times a day *use a wet spoon if your mouth gets dry*       5. "Super Swallow"  - Take a breath and hold it  - Swallow then IMMEDIATELY cough  - Repeat 10 times, 2-3 times a day

## 2022-08-23 NOTE — Therapy (Signed)
OUTPATIENT SPEECH LANGUAGE PATHOLOGY ONCOLOGY EVALUATION   Patient Name: Nichole Dominguez MRN: [id number] DOB:09-18-1955, 67 y.o., female Today's Date: 08/23/2022  PCP: [name], NP REFERRING PROVIDER: Lonie Peak, MD  END OF SESSION:  End of Session - 08/23/22 2109     Visit Number 1    Number of Visits 7    Date for SLP Re-Evaluation 11/21/22    SLP Start Time 1020    SLP Stop Time  1050    SLP Time Calculation (min) 30 min    Activity Tolerance Patient tolerated treatment well             Past Medical History:  Diagnosis Date   Allergy    GERD (gastroesophageal reflux disease)    Osteopenia 01/04/2020   Plantar fasciitis    Seasonal allergies    Past Surgical History:  Procedure Laterality Date   COLONOSCOPY  2011   IR GASTROSTOMY TUBE MOD SED  08/21/2022   IR IMAGING GUIDED PORT INSERTION  08/21/2022   MOUTH SURGERY  01/2020   implant inserted   TUBAL LIGATION  1992   Patient Active Problem List   Diagnosis Date Noted   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   Lower extremity edema 10/03/2021   Hypertension 08/23/2021   Neck mass 08/23/2021   Eczema 12/01/2020   Osteopenia 01/04/2020   Pap smear for cervical cancer screening 10/06/2014   Thyroid nodule 10/06/2014   Routine general medical examination at a health care facility 08/21/2013    ONSET DATE: See "pertinent hx" below   REFERRING DIAG: Secondary malignant neoplasm of cervical lymph node   THERAPY DIAG:  Dysphagia, unspecified type  Rationale for Evaluation and Treatment: Rehabilitation  SUBJECTIVE:   SUBJECTIVE STATEMENT: "I just coughed at night. Now I only cough once in a great while if I'm drinking." Pt accompanied by: family member  PERTINENT HISTORY:  05/07/22 She saw Dr. Marene Lenz and laryngoscopy was performed which revealed a relatively indurated mass lesion extending from the mid parotid gland  to level II in the neck measuring approx. 4 cm. Biopsy of the parotid mass was non diagnostic. 06/26/22 CT neck completed after she began to develop jaw pain and right neck pain. 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 The mass appeared to encase a portion of the right external carotid artery and partially encases the right internal carotid artery Prominent right-sided cervical and upper mediastinal lymph nodes were also appreciated, suspicious for metastatic spread of  disease. 07/17/22 She saw Dr. Christoper Allegra who did a Laryngoscopy performed during this visit was notable for mild right pharyngeal fullness, mild right vocal fold hypomobility, and limited anterior movement of the right tongue. Based on CT findings, Dr. Christoper Allegra recommended proceeding with repeat IR guided biopsy of the mass, as well as staging work-up including an MRI of the neck and PET scan. 07/27/22 MRI of the face showed the infiltrative mass lesion centered in the right carotid, parapharyngeal and parotid spaces with extensive surrounding local invasion, and findings concerning for metastatic adenopathy in the right neck and additional nonspecific mildly enlarged and rounded lymph nodes involving the upper mediastinum and contralateral left jugular chain. MRI also showed evidence of tumor extension within close proximity to multiple skill base foramina, along with evidence  of right hypoglossal nerve involvement contributing to right hemitongue denervation changes, and tumor thrombus within the encased right internal jugular vein. MRI otherwise showed no evidence of intracranial perineural tumor spread. 07/27/22 FNA of the right neck mass collected on 07/27/22 showed carcinoma with squamous features, favoring squamous cell carcinoma (however, other tumors with squamous  differentiation cannot entirely be excluded); P16 diffusely positive; HPV focally positive. 08/02/22 PET demonstrated  the dominant right level IIa metastatic nodal mass measuring 4.9 x 2.5 cm with an SUV max of 15; multiple ipsilateral nodal metastases involving the right level 3, level 4, and level 5 lymph node stations including the dominant right level 3 nodal metastasis (measuring 1.1 cm) coming in contact with the right oropharynx without discrete primary tumor; FDG avidity within the left palatine tonsil (normal in size and with a max SUV of 4.4); and mildly FDG avid mediastinal, bihilar, and subcarinal lymph nodes. No other sites concerning for metastatic disease were appreciated elsewhere in the body. Given that there is encasement of the carotid, surgical resection was not advised by Dr. Christoper Allegra and  chemo/radiation was recommended.08/10/22 Consult with Dr. Basilio Cairo & 08/17/22 Consult with Dr. Al Pimple. She will receive chemo/radiation. Treatment plan:  She will receive 35 fractions of radiation to her Oropharynx and bilateral neck with weekly cisplatin. She started radiation on 08/20/22 and will start chemo on 08/24/22 and will complete 10/08/22. Pretreatment procedures. 08/21/22 PEG/PAC  PAIN:  Are you having pain? Yes: NPRS scale: 5/10 Pain location: rt neck into behind the ear Pain description: like a toothache Aggravating factors: nothing Relieving factors: meds  FALLS: Has patient fallen in last 6 months?  See PT evaluation for details  LIVING ENVIRONMENT: Lives with: lives with their spouse Lives in: House/apartment  PLOF:  Level of assistance: Independent with ADLs, Independent with IADLs Employment: Retired  PATIENT GOALS: Maintain WNL swallowing  OBJECTIVE:   DIAGNOSTIC FINDINGS: See "pertinent history" above  COGNITION: Overall cognitive status: Within functional limits for tasks assessed  LANGUAGE: Receptive and Expressive language appeared WNL.  ORAL MOTOR EXAMINATION: Overall  status: Impaired: Lingual: Bilateral (ROM, Symmetry, Strength, and Coordination) Comments: rt worse than lt  MOTOR SPEECH: Overall motor speech: Appears intact Respiration: thoracic breathing Phonation: normal Resonance: WFL Articulation: Appears intact Intelligibility: Intelligible Motor planning: Appears intact  CLINICAL SWALLOW ASSESSMENT:   Current diet: regular and thin liquids; pt endorses cough with liquids "only once in a great while if I'm drinking" Objective recommended compensations: none noted Dentition: adequate natural dentition Patient directly observed with POs: Yes: dysphagia 3 (soft) and thin liquids  Feeding: able to feed self Liquids provided by: cup and straw Oral phase signs and symptoms:  none noted Pharyngeal phase signs and symptoms:  reports globus (points to just lt and superior to sternal notch, "it's felt like it's been there for a long time (before today's POs)"   TODAY'S TREATMENT:  DATE:  Research states the risk for dysphagia increases due to radiation and/or chemotherapy treatment due to a variety of factors, so SLP educated the pt about the possibility of reduced/limited ability for PO intake during rad tx. SLP also educated pt regarding possible changes to swallowing musculature after rad tx, and why adherence to dysphagia HEP provided today and PO consumption was necessary to inhibit muscle fibrosis following rad tx and to mitigate muscle disuse atrophy. SLP informed pt why this would be detrimental to their swallowing status and to their pulmonary health. Pt demonstrated understanding of these things to SLP. SLP encouraged pt to safely eat and drink as deep into their radiation/chemotherapy as possible to provide the best possible long-term swallowing outcome for pt.    SLP then developed an individualized HEP for pt  involving oral and pharyngeal strengthening and ROM and pt was instructed how to perform these exercises, including SLP demonstration. After SLP demonstration, pt return demonstrated each exercise. SLP ensured pt performance was correct prior to educating pt on next exercise. Pt required usual min cues faded to modified independent to perform HEP. Pt was instructed to complete this program 6-7 days/week, at least 2 times a day until 6 months after her last day of rad tx, and then x2 a week after that, indefinitely. Pt also given option to complete at least 20-30 of exercises necessitating swallowing but not completing >5 at once. Among other modifications for days when pt cannot functionally swallow, SLP also suggested pt to perform only non-swallowing tasks on the handout/HEP, and if necessary to cycle through the swallowing portion so the full program of exercises can be completed instead of fatiguing on one of the swallowing exercises and being unable to perform the other swallowing exercises. SLP instructed that swallowing exercises should then be added back into the regimen as pt is able to do so.   PATIENT EDUCATION: Education details: late effects head/neck radiation on swallow function, HEP procedure, and modification to HEP when difficulty experienced with swallowing during and after radiation course Person educated: Patient and friend Education method: Explanation, Demonstration, Verbal cues, and Handouts Education comprehension: verbalized understanding, returned demonstration, verbal cues required, and needs further education   ASSESSMENT:  CLINICAL IMPRESSION: Patient is a 67 y.o. F who was seen today for assessment of swallowing as they undergo radiation/chemoradiation therapy. POs: Pt ate [place] sandwich and drank thin liquids without overt s/s oral or pharyngeal difficulty. At this time pt swallowing is deemed WNL/WFL with these POs. No oral or overt s/sx pharyngeal deficits, including  aspiration were observed. There are no overt s/s aspiration PNA observed by SLP nor any reported by pt at this time. Data indicate that pt's swallow ability will likely decrease over the course of radiation/chemoradiation therapy and could very well decline over time following the conclusion of that therapy due to muscle disuse atrophy and/or muscle fibrosis. Pt will cont to need to be seen by SLP in order to assess safety of PO intake, assess the need for recommending any objective swallow assessment, and ensuring pt is correctly completing the individualized HEP.  OBJECTIVE IMPAIRMENTS: include dysphagia. These impairments are limiting patient from safety when swallowing. Factors affecting potential to achieve goals and functional outcome are  none noted today. Patient will benefit from skilled SLP services to address above impairments and improve overall function.  REHAB POTENTIAL: Good  GOALS: Goals reviewed with patient? No   SHORT TERM GOALS: Target: 3rd total session     1. Pt will  complete HEP with modified independence in 2 sessions Baseline: Goal status: Initial     2.  pt will tell SLP why pt is completing HEP with modified independence Baseline:  Goal status: Initial     3.  pt ill describe 3 overt s/s aspiration PNA with modified independence Baseline:  Goal status: Initial     4.  pt will tell SLP how a food journal may help return to a more normalized   diet Baseline:  Goal status: Initial     LONG TERM GOALS: Target: 7th total session   1.  pt will complete HEP with independence over two visits Baseline:  Goal status: Initial   2.  pt will describe how to modify HEP over time, and the timeline associated with reduction in HEP frequency with modified independence over two sessions Baseline:  Goal status: Initial   PLAN:   SLP FREQUENCY:  once approx every 4 weeks   SLP DURATION:  6 sessions (7 total sessions)   PLANNED INTERVENTIONS: Aspiration precaution  training, Pharyngeal strengthening exercises, Diet toleration management , Environmental controls, Trials of upgraded texture/liquids, Cueing hierachy, Internal/external aids, SLP instruction and feedback, Compensatory strategies, and Patient/family education    [facility], CCC-SLP 08/23/2022, 9:09 PM

## 2022-08-23 NOTE — Progress Notes (Signed)
[facility] Clinical Social Work  Initial Assessment   Nichole Dominguez is a 67 y.o. year old female accompanied by patient and sister in law . Clinical Social Work was referred by nurse navigator for assessment of psychosocial needs.   SDOH (Social Determinants of Health) assessments performed: Yes   SDOH Screenings   Food Insecurity: No Food Insecurity (08/09/2022)  Housing: Low Risk  (08/09/2022)  Transportation Needs: No Transportation Needs (08/09/2022)  Utilities: Not At Risk (08/09/2022)  Depression (PHQ2-9): Low Risk  (08/09/2022)  Tobacco Use: Medium Risk (08/23/2022)     Distress Screen completed: No     No data to display            Family/Social Information:  Housing Arrangement: patient lives with spouse Family members/support persons in your life? Family and Friends Transportation concerns: no  Employment: Retired .  Income source: Actor concerns: No Type of concern: None Food access concerns: no Religious or spiritual practice: Not known Services Currently in place:  [employer]  Coping/ Adjustment to diagnosis: Patient understands treatment plan and what happens next? yes Concerns about diagnosis and/or treatment: I'm not especially worried about anything Current coping skills/ strengths: Capable of independent living , Communication skills , Motivation for treatment/growth , and Supportive family/friends     SUMMARY: Current SDOH Barriers:  No major barriers noted today  Clinical Social Work Clinical Goal(s):  No clinical social work goals at this time  Interventions: Discussed the importance of support during treatment Informed patient of the support team roles and support services at [facility] Provided CSW contact information and encouraged patient to call with any questions or concerns   Follow Up Plan: Patient will contact CSW with any support or resource needs Patient verbalizes understanding of plan: Yes    Derrious Bologna E  Dorrene Bently, LCSW Clinical Social Worker [facility]

## 2022-08-23 NOTE — Progress Notes (Signed)
[facility] [address]  [place], [place] [address] [phone]   New Patient Evaluation  Date of Service: 08/23/22 Patient Name: Nichole Dominguez Patient MRN: [id number] Patient DOB: [birth year] Provider: Henreitta Leber, MD  Identifying Statement:  Nichole Dominguez is a 67 y.o. female with Facial nerve sensory disorder  Malignant neoplasm of parapharyngeal space [facility]) who presents for initial consultation and evaluation regarding cancer associated neurologic deficits.    Referring Provider: [name], NP [address] [place],  [place] [address]  Primary Cancer:  Oncologic History: Oncology History  Secondary malignant neoplasm of cervical lymph node (HCC)  08/10/2022 Cancer Staging   Staging form: Pharynx - HPV-Mediated Oropharynx, AJCC 8th Edition - Clinical stage from 08/10/2022: Stage I (cT0, cN1, cM0, p16+) - Signed by Lonie Peak, MD on 08/11/2022 Stage prefix: Initial diagnosis   08/11/2022 Initial Diagnosis   Secondary malignant neoplasm of cervical lymph node (HCC)   08/22/2022 -  Chemotherapy   Patient is on Treatment Plan : HEAD/NECK Cisplatin (40) q7d       History of Present Illness: The patient's records from the referring physician were obtained and reviewed and the patient interviewed to confirm this HPI.  Nichole Dominguez presents today to review facial pain syndrome.  She describes several weeks history of "shooting, electrical-type" pain going up the back side of her ear.  Symptoms can be episodic or persistent, can be worse at night.  Gabapentin has been titrated up to 600mg  3x per day, which has helped some, but not eliminated the pain.  Also dosing oxycodone at night as needed.  This week started radiation treatments for parotid mass, first infusion of cisplatin is scheduled for tomorrow with Dr. Al Pimple.  Medications: Current Outpatient Medications on File Prior to Visit  Medication Sig Dispense  Refill   acetaminophen (TYLENOL) 500 MG tablet Take 1,500 mg by mouth every 6 (six) hours as needed.     augmented betamethasone dipropionate (DIPROLENE-AF) 0.05 % ointment Apply topically 2 (two) times daily. 15 g 0   Calcium Carbonate (CALCIUM 600 HIGH POTENCY PO) Take 3 capsules by mouth daily. (Patient not taking: Reported on 08/09/2022)     cholecalciferol (VITAMIN D3) 25 MCG (1000 UNIT) tablet Take 1,000 Units by mouth daily. (Patient not taking: Reported on 08/09/2022)     dexamethasone (DECADRON) 4 MG tablet Take 2 tablets (8 mg) by mouth daily x 3 days starting the day after cisplatin chemotherapy. Take with food. 30 tablet 1   fluticasone (FLONASE) 50 MCG/ACT nasal spray USE 2 SPRAYS IN EACH NOSTRIL DAILY 48 g 3   gabapentin (NEURONTIN) 300 MG capsule Take 1 capsule (300 mg total) by mouth 4 (four) times daily. 120 capsule 0   HYDROcodone-acetaminophen (HYCET) 7.5-325 mg/15 ml solution Take 10-15 mLs by mouth every 4 (four) hours as needed for moderate pain or severe pain. Take with food. 300 mL 0   lidocaine-prilocaine (EMLA) cream Apply to affected area once 30 g 3   losartan (COZAAR) 50 MG tablet Take 1 tablet (50 mg total) by mouth daily. 90 tablet 1   Multiple Vitamins-Minerals (MULTIVITAMIN WITH MINERALS) tablet Take 1 tablet by mouth daily. (Patient not taking: Reported on 08/09/2022)     omeprazole (PRILOSEC) 20 MG capsule TAKE 1 CAPSULE (20 MG TOTAL) BY MOUTH EVERY MORNING. 90 capsule 1   ondansetron (ZOFRAN) 8 MG tablet Take 1 tablet (8 mg total) by mouth every 8 (eight) hours as needed for  nausea or vomiting. Start on the third day after cisplatin. 30 tablet 1   prochlorperazine (COMPAZINE) 10 MG tablet Take 1 tablet (10 mg total) by mouth every 6 (six) hours as needed (Nausea or vomiting). 30 tablet 1   Zinc 50 MG TABS Take 1 tablet by mouth daily as needed. (Patient not taking: Reported on 08/09/2022)     No current facility-administered medications on file prior to visit.     Allergies:  Allergies  Allergen Reactions   Amlodipine     swelling   Darvon [Propoxyphene] Rash   Sudafed [Pseudoephedrine Hcl]     rash   Past Medical History:  Past Medical History:  Diagnosis Date   Allergy    GERD (gastroesophageal reflux disease)    Osteopenia 01/04/2020   Plantar fasciitis    Seasonal allergies    Past Surgical History:  Past Surgical History:  Procedure Laterality Date   COLONOSCOPY  2011   IR GASTROSTOMY TUBE MOD SED  08/21/2022   IR IMAGING GUIDED PORT INSERTION  08/21/2022   MOUTH SURGERY  01/2020   implant inserted   TUBAL LIGATION  1992   Social History:  Social History   Socioeconomic History   Marital status: Married    Spouse name: Not on file   Number of children: Not on file   Years of education: Not on file   Highest education level: Not on file  Occupational History   Not on file  Tobacco Use   Smoking status: Former    Packs/day: 1.00    Years: 24.00    Additional pack years: 0.00    Total pack years: 24.00    Types: Cigarettes    Quit date: 10/10/1996    Years since quitting: 25.8   Smokeless tobacco: Never  Vaping Use   Vaping Use: Never used  Substance and Sexual Activity   Alcohol use: Yes    Alcohol/week: 0.0 standard drinks of alcohol    Comment: 1 shot vodka daily   Drug use: No   Sexual activity: Yes    Partners: Male  Other Topics Concern   Not on file  Social History Narrative   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    Lives with husband   1 daughter in [place], 46 grandchildren   Son died   2 grown stepchildren- both Designer, multimedia, grandchildren   Enjoys Corporate investment banker   Social Determinants of Corporate investment banker Strain: Not on file  Food Insecurity: No Food Insecurity (08/09/2022)   Hunger Vital Sign    Worried About Running Out of Food in the Last Year: Never true    Ran Out of Food in the Last Year: Never true  Transportation Needs: No Transportation Needs (08/09/2022)   PRAPARE  - Administrator, Civil Service (Medical): No    Lack of Transportation (Non-Medical): No  Physical Activity: Not on file  Stress: Not on file  Social Connections: Not on file  Intimate Partner Violence: Not At Risk (08/09/2022)   Humiliation, Afraid, Rape, and Kick questionnaire    Fear of Current or Ex-Partner: No    Emotionally Abused: No    Physically Abused: No    Sexually Abused: No   Family History:  Family History  Problem Relation Age of Onset   Diabetes Mother    Heart disease Mother        chf   Hypertension Mother    Cervical cancer Mother    Obesity Mother  Colon polyps Mother    Thyroid cancer Father    Cancer Father        thyroid x2   Heart attack Father    Breast cancer Sister    Lupus Sister    Obesity Maternal Grandmother    Diabetes Maternal Grandmother    Heart disease Maternal Grandmother    Hypertension Maternal Grandmother    Stroke Maternal Grandmother    Diabetes Mellitus II Paternal Grandmother        died from diabetic coma   Cancer Paternal Grandfather        not sure what type   Heart attack Son    Esophageal cancer Neg Hx    Stomach cancer Neg Hx    Rectal cancer Neg Hx    Colon cancer Neg Hx     Review of Systems: Constitutional: Doesn't report fevers, chills or abnormal weight loss Eyes: Doesn't report blurriness of vision Ears, nose, mouth, throat, and face: Doesn't report sore throat Respiratory: Doesn't report cough, dyspnea or wheezes Cardiovascular: Doesn't report palpitation, chest discomfort  Gastrointestinal:  Doesn't report nausea, constipation, diarrhea GU: Doesn't report incontinence Skin: Doesn't report skin rashes Neurological: Per HPI Musculoskeletal: Doesn't report joint pain Behavioral/Psych: Doesn't report anxiety  Physical Exam: Vitals:   08/23/22 1326  BP: 134/77  Pulse: (!) 103  Resp: 17  Temp: 97.9 F (36.6 C)  SpO2: 100%   KPS: 80. General: Alert, cooperative, pleasant, in no acute  distress Head: Normal EENT: No conjunctival injection or scleral icterus.  Lungs: Resp effort normal Cardiac: Regular rate Abdomen: Non-distended abdomen Skin: No rashes cyanosis or petechiae. Extremities: No clubbing or edema  Neurologic Exam: Mental Status: Awake, alert, attentive to examiner. Oriented to self and environment. Language is fluent with intact comprehension.  Cranial Nerves: Visual acuity is grossly normal. Visual fields are full. Extra-ocular movements intact. No ptosis. Face is symmetric Motor: Tone and bulk are normal. Power is full in both arms and legs. Reflexes are symmetric, no pathologic reflexes present.  Sensory: Intact to light touch Gait: Normal.   Labs: I have reviewed the data as listed    Component Value Date/Time   NA 136 08/21/2022 1000   K 4.2 08/21/2022 1000   CL 102 08/21/2022 1000   CO2 24 08/21/2022 1000   GLUCOSE 125 (H) 08/21/2022 1000   BUN 16 08/21/2022 1000   CREATININE 0.84 08/21/2022 1000   CREATININE 0.98 08/17/2022 1044   CREATININE 1.02 (H) 12/28/2019 0749   CALCIUM 9.4 08/21/2022 1000   PROT 7.3 12/28/2019 0749   ALBUMIN 4.2 12/24/2018 0942   AST 18 12/28/2019 0749   ALT 49 (H) 12/28/2019 0749   ALKPHOS 124 (H) 12/24/2018 0942   BILITOT 0.7 12/28/2019 0749   GFRNONAA >60 08/21/2022 1000   GFRNONAA >60 08/17/2022 1044   Lab Results  Component Value Date   WBC 7.7 08/21/2022   NEUTROABS 5.4 08/21/2022   HGB 13.8 08/21/2022   HCT 40.9 08/21/2022   MCV 94.9 08/21/2022   PLT 359 08/21/2022    Imaging:  IR Gastrostomy Tube  Result Date: 08/21/2022 INDICATION: Head and neck malignancy EXAM: Percutaneous gastrostomy tube placement MEDICATIONS: Ancef 2 g IV; Antibiotics were administered within 1 hour of the procedure. Glucagon 1 mg IV ANESTHESIA/SEDATION: Moderate (conscious) sedation was employed during this procedure. A total of Versed 2 mg and Fentanyl 100 mcg was administered intravenously by the radiology nurse. Total  intra-service moderate Sedation Time: 23 minutes. The patient's level of consciousness and vital  signs were monitored continuously by radiology nursing throughout the procedure under my direct supervision. CONTRAST:  15 mL of Omnipaque 300 administered into the gastric lumen. FLUOROSCOPY: Radiation Exposure Index (as provided by the fluoroscopic device): 23 mGy Kerma COMPLICATIONS: None immediate. PROCEDURE: Informed written consent was obtained from the patient after a thorough discussion of the procedural risks, benefits and alternatives. All questions were addressed. Maximal Sterile Barrier Technique was utilized including caps, mask, sterile gowns, sterile gloves, sterile drape, hand hygiene and skin antiseptic. A timeout was performed prior to the initiation of the procedure. The left hepatic lobe margin was marked utilizing ultrasound guidance. The epigastric region was prepped and draped in the usual sterile fashion. The stomach was insufflated utilizing the NG tube. Following local lidocaine administration, 2 gastropexies were placed to secure the anterior wall of the stomach to the anterior abdominal wall. Percutaneous access obtained into the gastric antrum at the center of the gastropexies with an 18 gauge needle. Guide wire advanced into the gastric lumen. Serial dilation performed over guidewire and 18 French gastrostomy tube inserted through the peel-away sheath. The G tube retention balloon was inflated with 10 mL of dilute contrast and retracted to the anterior gastric wall. Contrast administrated through the gastrostomy tube opacified the gastric lumen. The insertion site was covered with sterile dressing. IMPRESSION: Successful insertion of 18 French balloon retention gastrostomy tube. Electronically Signed   By: Acquanetta Belling M.D.   On: 08/21/2022 14:36   IR IMAGING GUIDED PORT INSERTION  Result Date: 08/21/2022 INDICATION: Head and neck malignancy EXAM: IMPLANTED PORT A CATH PLACEMENT WITH  ULTRASOUND AND FLUOROSCOPIC GUIDANCE MEDICATIONS: None ANESTHESIA/SEDATION: Moderate (conscious) sedation was employed during this procedure. A total of Versed 2 mg and Fentanyl 100 mcg was administered intravenously by the radiology nurse. Total intra-service moderate Sedation Time: 24 minutes. The patient's level of consciousness and vital signs were monitored continuously by radiology nursing throughout the procedure under my direct supervision. FLUOROSCOPY: Radiation Exposure Index (as provided by the fluoroscopic device): 1 mGy Kerma COMPLICATIONS: None immediate. PROCEDURE: The procedure, risks, benefits, and alternatives were explained to the patient. Questions regarding the procedure were encouraged and answered. The patient understands and consents to the procedure. A timeout was performed prior to the initiation of the procedure. Patient positioned supine on the angiography table. Right neck and anterior upper chest prepped and draped in the usual sterile fashion. All elements of maximal sterile barrier were utilized including, cap, mask, sterile gown, sterile gloves, large sterile drape, hand scrubbing and 2% Chlorhexidine for skin cleaning. The right internal jugular vein was evaluated with ultrasound and shown to be patent. A permanent ultrasound image was obtained and placed in the patient's medical record. Local anesthesia was provided with 1% lidocaine with epinephrine. Using sterile gel and a sterile probe cover, the right internal jugular vein was entered with a 21 ga needle during real time ultrasound guidance. 0.018 inch guidewire placed and 21 ga needle exchanged for transitional dilator set. Utilizing fluoroscopy, 0.035 inch guidewire advanced centrally without difficulty. Attention then turned to the right anterior upper chest. Following local lidocaine administration, a port pocket was created. The catheter was connected to the port and brought from the pocket to the venotomy site through a  subcutaneous tunnel. The catheter was cut to size and inserted through the peel-away sheath. The catheter tip was positioned at the cavoatrial junction using fluoroscopic guidance. The port aspirated and flushed well. The port pocket was closed with deep and superficial absorbable suture. The  port pocket incision and venotomy sites were also sealed with Dermabond. IMPRESSION: Successful placement of a right internal jugular approach power injectable Port-A-Cath. The catheter is ready for immediate use. Electronically Signed   By: Acquanetta Belling M.D.   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  Etiology is compression from head and neck mass.    Discussed possibility of relief of symptoms through direct treatment of the tumor, radiation and chemotherapy.  Pending that, we discussed increasing Gabapentin to 800mg  TID.  Will also add cymbalta 30mg  daily.  If not improved, can consider switching to Lyrica or adding alternate agents.   We spent twenty additional minutes teaching regarding the natural history, biology, and historical experience in the treatment of neurologic complications of cancer.   We appreciate the opportunity to participate in the care of Nichole Dominguez.  We will touch base with her in 1-2 weeks for further medication adjustment.  All questions were answered. The patient knows to call the clinic with any problems, questions or concerns. No barriers to learning were detected.  The total time spent in the encounter was 40 minutes and more than 50% was on counseling and review of test results   Henreitta Leber, MD Medical Director of Neuro-Oncology [facility] 08/23/22 2:21 PM

## 2022-08-24 ENCOUNTER — Inpatient Hospital Stay: Payer: 59

## 2022-08-24 ENCOUNTER — Other Ambulatory Visit: Payer: Self-pay

## 2022-08-24 ENCOUNTER — Other Ambulatory Visit: Payer: 59

## 2022-08-24 ENCOUNTER — Telehealth: Payer: Self-pay | Admitting: Internal Medicine

## 2022-08-24 ENCOUNTER — Ambulatory Visit: Payer: 59

## 2022-08-24 ENCOUNTER — Ambulatory Visit
Admission: RE | Admit: 2022-08-24 | Discharge: 2022-08-24 | Disposition: A | Discharge status: Home / Self Care | Payer: 59 | Source: Ambulatory Visit | Attending: Radiation Oncology | Admitting: Radiation Oncology

## 2022-08-24 VITALS — BP 152/88 | HR 94 | Temp 98.9°F | Resp 20

## 2022-08-24 DIAGNOSIS — C770 Secondary and unspecified malignant neoplasm of lymph nodes of head, face and neck: Secondary | ICD-10-CM

## 2022-08-24 DIAGNOSIS — R449 Unspecified symptoms and signs involving general sensations and perceptions: Secondary | ICD-10-CM | POA: Diagnosis not present

## 2022-08-24 DIAGNOSIS — Z5111 Encounter for antineoplastic chemotherapy: Secondary | ICD-10-CM | POA: Diagnosis not present

## 2022-08-24 DIAGNOSIS — C140 Malignant neoplasm of pharynx, unspecified: Secondary | ICD-10-CM | POA: Diagnosis not present

## 2022-08-24 DIAGNOSIS — Z95828 Presence of other vascular implants and grafts: Secondary | ICD-10-CM | POA: Insufficient documentation

## 2022-08-24 DIAGNOSIS — Z510 Encounter for antineoplastic radiation therapy: Secondary | ICD-10-CM | POA: Diagnosis not present

## 2022-08-24 DIAGNOSIS — Z87891 Personal history of nicotine dependence: Secondary | ICD-10-CM | POA: Diagnosis not present

## 2022-08-24 LAB — BASIC METABOLIC PANEL - CANCER CENTER ONLY
Anion gap: 10 (ref 5–15)
BUN: 27 mg/dL — ABNORMAL HIGH (ref 8–23)
CO2: 28 mmol/L (ref 22–32)
Calcium: 9.7 mg/dL (ref 8.9–10.3)
Chloride: 98 mmol/L (ref 98–111)
Creatinine: 1.01 mg/dL — ABNORMAL HIGH (ref 0.44–1.00)
GFR, Estimated: >60 mL/min (ref >60)
Glucose, Bld: 148 mg/dL — ABNORMAL HIGH (ref 70–99)
Potassium: 3.4 mmol/L — ABNORMAL LOW (ref 3.5–5.1)
Sodium: 136 mmol/L (ref 135–145)

## 2022-08-24 LAB — RAD ONC ARIA SESSION SUMMARY
Course Elapsed Days: 4
Plan Fractions Treated to Date: 5
Plan Prescribed Dose Per Fraction: 2 Gy
Plan Total Fractions Prescribed: 35
Plan Total Prescribed Dose: 70 Gy
Reference Point Dosage Given to Date: 10 Gy
Reference Point Session Dosage Given: 2 Gy
Session Number: 5

## 2022-08-24 LAB — CBC WITH DIFFERENTIAL (CANCER CENTER ONLY)
Abs Immature Granulocytes: 0.07 10*3/uL (ref 0.00–0.07)
Basophils Absolute: 0.1 10*3/uL (ref 0.0–0.1)
Basophils Relative: 1 %
Eosinophils Absolute: 0 10*3/uL (ref 0.0–0.5)
Eosinophils Relative: 0 %
HCT: 38.2 % (ref 36.0–46.0)
Hemoglobin: 13.5 g/dL (ref 12.0–15.0)
Immature Granulocytes: 1 %
Lymphocytes Relative: 7 %
Lymphs Abs: 0.9 10*3/uL (ref 0.7–4.0)
MCH: 33.1 pg (ref 26.0–34.0)
MCHC: 35.3 g/dL (ref 30.0–36.0)
MCV: 93.6 fL (ref 80.0–100.0)
Monocytes Absolute: 0.8 10*3/uL (ref 0.1–1.0)
Monocytes Relative: 7 %
Neutro Abs: 11 10*3/uL — ABNORMAL HIGH (ref 1.7–7.7)
Neutrophils Relative %: 84 %
Platelet Count: 372 10*3/uL (ref 150–400)
RBC: 4.08 MIL/uL (ref 3.87–5.11)
RDW: 12.1 % (ref 11.5–15.5)
WBC Count: 12.9 10*3/uL — ABNORMAL HIGH (ref 4.0–10.5)
nRBC: 0 % (ref 0.0–0.2)

## 2022-08-24 LAB — MAGNESIUM: Magnesium: 1.9 mg/dL (ref 1.7–2.4)

## 2022-08-24 MED ORDER — SODIUM CHLORIDE 0.9 % IV SOLN
40.0000 mg/m2 | Freq: Once | INTRAVENOUS | Status: AC
  Administered 2022-08-24: 71 mg via INTRAVENOUS
  Filled 2022-08-24: qty 71

## 2022-08-24 MED ORDER — POTASSIUM CHLORIDE IN NACL 20-0.9 MEQ/L-% IV SOLN
Freq: Once | INTRAVENOUS | Status: AC
  Filled 2022-08-24: qty 1000

## 2022-08-24 MED ORDER — SODIUM CHLORIDE 0.9% FLUSH
10.0000 mL | Freq: Once | INTRAVENOUS | Status: AC
  Administered 2022-08-24: 10 mL

## 2022-08-24 MED ORDER — SODIUM CHLORIDE 0.9% FLUSH
10.0000 mL | INTRAVENOUS | Status: DC | PRN
  Administered 2022-08-24: 10 mL

## 2022-08-24 MED ORDER — SODIUM CHLORIDE 0.9 % IV SOLN
150.0000 mg | Freq: Once | INTRAVENOUS | Status: AC
  Administered 2022-08-24: 150 mg via INTRAVENOUS
  Filled 2022-08-24: qty 150

## 2022-08-24 MED ORDER — SODIUM CHLORIDE 0.9 % IV SOLN
10.0000 mg | Freq: Once | INTRAVENOUS | Status: AC
  Administered 2022-08-24: 10 mg via INTRAVENOUS
  Filled 2022-08-24: qty 10

## 2022-08-24 MED ORDER — PALONOSETRON HCL INJECTION 0.25 MG/5ML
0.2500 mg | Freq: Once | INTRAVENOUS | Status: AC
  Administered 2022-08-24: 0.25 mg via INTRAVENOUS
  Filled 2022-08-24: qty 5

## 2022-08-24 MED ORDER — HEPARIN SOD (PORK) LOCK FLUSH 100 UNIT/ML IV SOLN
500.0000 [IU] | Freq: Once | INTRAVENOUS | Status: AC | PRN
  Administered 2022-08-24: 500 [IU]

## 2022-08-24 MED ORDER — MAGNESIUM SULFATE 2 GM/50ML IV SOLN
2.0000 g | Freq: Once | INTRAVENOUS | Status: AC
  Administered 2022-08-24: 2 g via INTRAVENOUS
  Filled 2022-08-24: qty 50

## 2022-08-24 MED ORDER — SODIUM CHLORIDE 0.9 % IV SOLN: Freq: Once | INTRAVENOUS | Status: AC

## 2022-08-24 NOTE — Telephone Encounter (Signed)
Scheduled per 05/16 los, patient has been called and notified.

## 2022-08-24 NOTE — Patient Instructions (Signed)
[facility]  Discharge Instructions: Thank you for choosing [facility] to provide your oncology and hematology care.   If you have a lab appointment with the Cancer Center, please go directly to the Cancer Center and check in at the registration area.   Wear comfortable clothing and clothing appropriate for easy access to any Portacath or PICC line.   We strive to give you quality time with your provider. You may need to reschedule your appointment if you arrive late (15 or more minutes).  Arriving late affects you and other patients whose appointments are after yours.  Also, if you miss three or more appointments without notifying the office, you may be dismissed from the clinic at the provider's discretion.      For prescription refill requests, have your pharmacy contact our office and allow 72 hours for refills to be completed.    Today you received the following chemotherapy and/or immunotherapy agents Cisplatin      To help prevent nausea and vomiting after your treatment, we encourage you to take your nausea medication as directed.  BELOW ARE SYMPTOMS THAT SHOULD BE REPORTED IMMEDIATELY: *FEVER GREATER THAN 100.4 F (38 C) OR HIGHER *CHILLS OR SWEATING *NAUSEA AND VOMITING THAT IS NOT CONTROLLED WITH YOUR NAUSEA MEDICATION *UNUSUAL SHORTNESS OF BREATH *UNUSUAL BRUISING OR BLEEDING *URINARY PROBLEMS (pain or burning when urinating, or frequent urination) *BOWEL PROBLEMS (unusual diarrhea, constipation, pain near the anus) TENDERNESS IN MOUTH AND THROAT WITH OR WITHOUT PRESENCE OF ULCERS (sore throat, sores in mouth, or a toothache) UNUSUAL RASH, SWELLING OR PAIN  UNUSUAL VAGINAL DISCHARGE OR ITCHING   Items with * indicate a potential emergency and should be followed up as soon as possible or go to the Emergency Department if any problems should occur.  Please show the CHEMOTHERAPY ALERT CARD or IMMUNOTHERAPY ALERT CARD at  check-in to the Emergency Department and triage nurse.  Should you have questions after your visit or need to cancel or reschedule your appointment, please contact [facility]  Dept: [phone]  and follow the prompts.  Office hours are 8:00 a.m. to 4:30 p.m. Monday - Friday. Please note that voicemails left after 4:00 p.m. may not be returned until the following business day.  We are closed weekends and major holidays. You have access to a nurse at all times for urgent questions. Please call the main number to the clinic Dept: [phone] and follow the prompts.   For any non-urgent questions, you may also contact your provider using MyChart. We now offer e-Visits for anyone 18 and older to request care online for non-urgent symptoms. For details visit [email or web address].   Also download the MyChart app! Go to the app store, search "MyChart", open the app, select [facility], and log in with your MyChart username and password.   Cisplatin Injection What is this medication? CISPLATIN (SIS pla tin) treats some types of cancer. It works by slowing down the growth of cancer cells. This medicine may be used for other purposes; ask your health care provider or pharmacist if you have questions. COMMON BRAND NAME(S): Platinol, Platinol -AQ What should I tell my care team before I take this medication? They need to know if you have any of these conditions: Eye disease, vision problems Hearing problems Kidney disease Low blood counts, such as low white cells, platelets, or red blood cells Tingling of the fingers or toes, or other nerve disorder An   unusual or allergic reaction to cisplatin, carboplatin, oxaliplatin, other medications, foods, dyes, or preservatives If you or your partner are pregnant or trying to get pregnant Breast-feeding How should I use this medication? This medication is injected into a vein. It is given by your care team in a hospital  or clinic setting. Talk to your care team about the use of this medication in children. Special care may be needed. Overdosage: If you think you have taken too much of this medicine contact a poison control center or emergency room at once. NOTE: This medicine is only for you. Do not share this medicine with others. What if I miss a dose? Keep appointments for follow-up doses. It is important not to miss your dose. Call your care team if you are unable to keep an appointment. What may interact with this medication? Do not take this medication with any of the following: Live virus vaccines This medication may also interact with the following: Certain antibiotics, such as amikacin, gentamicin, neomycin, polymyxin B, streptomycin, tobramycin, vancomycin Foscarnet This list may not describe all possible interactions. Give your health care provider a list of all the medicines, herbs, non-prescription drugs, or dietary supplements you use. Also tell them if you smoke, drink alcohol, or use illegal drugs. Some items may interact with your medicine. What should I watch for while using this medication? Your condition will be monitored carefully while you are receiving this medication. You may need blood work done while taking this medication. This medication may make you feel generally unwell. This is not uncommon, as chemotherapy can affect healthy cells as well as cancer cells. Report any side effects. Continue your course of treatment even though you feel ill unless your care team tells you to stop. This medication may increase your risk of getting an infection. Call your care team for advice if you get a fever, chills, sore throat, or other symptoms of a cold or flu. Do not treat yourself. Try to avoid being around people who are sick. Avoid taking medications that contain aspirin, acetaminophen, ibuprofen, naproxen, or ketoprofen unless instructed by your care team. These medications may hide a  fever. This medication may increase your risk to bruise or bleed. Call your care team if you notice any unusual bleeding. Be careful brushing or flossing your teeth or using a toothpick because you may get an infection or bleed more easily. If you have any dental work done, tell your dentist you are receiving this medication. Drink fluids as directed while you are taking this medication. This will help protect your kidneys. Call your care team if you get diarrhea. Do not treat yourself. Talk to your care team if you or your partner wish to become pregnant or think you might be pregnant. This medication can cause serious birth defects if taken during pregnancy and for 14 months after the last dose. A negative pregnancy test is required before starting this medication. A reliable form of contraception is recommended while taking this medication and for 14 months after the last dose. Talk to your care team about effective forms of contraception. Do not father a child while taking this medication and for 11 months after the last dose. Use a condom during sex during this time period. Do not breast-feed while taking this medication. This medication may cause infertility. Talk to your care team if you are concerned about your fertility. What side effects may I notice from receiving this medication? Side effects that you should report to your   care team as soon as possible: Allergic reactions--skin rash, itching, hives, swelling of the face, lips, tongue, or throat Eye pain, change in vision, vision loss Hearing loss, ringing in ears Infection--fever, chills, cough, sore throat, wounds that don't heal, pain or trouble when passing urine, general feeling of discomfort or being unwell Kidney injury--decrease in the amount of urine, swelling of the ankles, hands, or feet Low red blood cell level--unusual weakness or fatigue, dizziness, headache, trouble breathing Painful swelling, warmth, or redness of the skin,  blisters or sores at the infusion site Pain, tingling, or numbness in the hands or feet Unusual bruising or bleeding Side effects that usually do not require medical attention (report to your care team if they continue or are bothersome): Hair loss Nausea Vomiting This list may not describe all possible side effects. Call your doctor for medical advice about side effects. You may report side effects to FDA at 1-800-FDA-1088. Where should I keep my medication? This medication is given in a hospital or clinic. It will not be stored at home. NOTE: This sheet is a summary. It may not cover all possible information. If you have questions about this medicine, talk to your doctor, pharmacist, or health care provider.  2023 [employer]vier/[employer] Standard (2021-07-21 00:00:00)  

## 2022-08-27 ENCOUNTER — Encounter: Payer: Self-pay | Admitting: Hematology and Oncology

## 2022-08-27 ENCOUNTER — Telehealth: Payer: Self-pay

## 2022-08-27 ENCOUNTER — Ambulatory Visit
Admission: RE | Admit: 2022-08-27 | Discharge: 2022-08-27 | Disposition: A | Discharge status: Home / Self Care | Payer: 59 | Source: Ambulatory Visit | Attending: Radiation Oncology | Admitting: Radiation Oncology

## 2022-08-27 ENCOUNTER — Other Ambulatory Visit: Payer: Self-pay | Admitting: Radiation Oncology

## 2022-08-27 ENCOUNTER — Other Ambulatory Visit: Payer: Self-pay

## 2022-08-27 DIAGNOSIS — Z510 Encounter for antineoplastic radiation therapy: Secondary | ICD-10-CM | POA: Diagnosis not present

## 2022-08-27 DIAGNOSIS — R449 Unspecified symptoms and signs involving general sensations and perceptions: Secondary | ICD-10-CM | POA: Diagnosis not present

## 2022-08-27 DIAGNOSIS — Z5111 Encounter for antineoplastic chemotherapy: Secondary | ICD-10-CM | POA: Diagnosis not present

## 2022-08-27 DIAGNOSIS — C770 Secondary and unspecified malignant neoplasm of lymph nodes of head, face and neck: Secondary | ICD-10-CM

## 2022-08-27 DIAGNOSIS — C140 Malignant neoplasm of pharynx, unspecified: Secondary | ICD-10-CM | POA: Diagnosis not present

## 2022-08-27 DIAGNOSIS — Z87891 Personal history of nicotine dependence: Secondary | ICD-10-CM | POA: Diagnosis not present

## 2022-08-27 LAB — RAD ONC ARIA SESSION SUMMARY
Course Elapsed Days: 7
Plan Fractions Treated to Date: 6
Plan Prescribed Dose Per Fraction: 2 Gy
Plan Total Fractions Prescribed: 35
Plan Total Prescribed Dose: 70 Gy
Reference Point Dosage Given to Date: 12 Gy
Reference Point Session Dosage Given: 2 Gy
Session Number: 6

## 2022-08-27 MED ORDER — LIDOCAINE VISCOUS HCL 2 % MT SOLN
OROMUCOSAL | 3 refills | Status: DC
Start: 2022-08-27 — End: 2022-10-12

## 2022-08-27 NOTE — Telephone Encounter (Signed)
-----   Message from Loa Socks, NP sent at 08/24/2022 12:21 PM EDT ----- Potassium slightly decreased, recommend increased potassium rich foods, and increased water intake ----- Message ----- From: Leory Plowman, Lab In [place] Sent: 08/24/2022   9:21 AM EDT To: Rachel Moulds, MD

## 2022-08-27 NOTE — Telephone Encounter (Signed)
Called pt to see how she did post chemo & she reports doing well.  She denies any problems & reports lots of energy.  She has taken her decadron & explained that this may be helping her energy level.  She knows her next appts & seems to understand her nausea meds & how to reach [place] if needed.

## 2022-08-27 NOTE — Telephone Encounter (Signed)
Notified the pt with the message below. Pt verbalized understanding.   Naheem Mosco M. CMA

## 2022-08-27 NOTE — Telephone Encounter (Signed)
-----   Message from Bradd Burner, RN sent at 08/24/2022  4:49 PM EDT ----- Regarding: Dr. Al Pimple - first time cisplatin f/u call - pt tolerated well

## 2022-08-28 ENCOUNTER — Other Ambulatory Visit: Payer: Self-pay

## 2022-08-28 ENCOUNTER — Ambulatory Visit
Admission: RE | Admit: 2022-08-28 | Discharge: 2022-08-28 | Disposition: A | Discharge status: Home / Self Care | Payer: 59 | Source: Ambulatory Visit | Attending: Radiation Oncology | Admitting: Radiation Oncology

## 2022-08-28 DIAGNOSIS — Z510 Encounter for antineoplastic radiation therapy: Secondary | ICD-10-CM | POA: Diagnosis not present

## 2022-08-28 DIAGNOSIS — R449 Unspecified symptoms and signs involving general sensations and perceptions: Secondary | ICD-10-CM | POA: Diagnosis not present

## 2022-08-28 DIAGNOSIS — Z5111 Encounter for antineoplastic chemotherapy: Secondary | ICD-10-CM | POA: Diagnosis not present

## 2022-08-28 DIAGNOSIS — C140 Malignant neoplasm of pharynx, unspecified: Secondary | ICD-10-CM | POA: Diagnosis not present

## 2022-08-28 DIAGNOSIS — Z87891 Personal history of nicotine dependence: Secondary | ICD-10-CM | POA: Diagnosis not present

## 2022-08-28 DIAGNOSIS — C770 Secondary and unspecified malignant neoplasm of lymph nodes of head, face and neck: Secondary | ICD-10-CM | POA: Diagnosis not present

## 2022-08-28 LAB — RAD ONC ARIA SESSION SUMMARY
Course Elapsed Days: 8
Plan Fractions Treated to Date: 7
Plan Prescribed Dose Per Fraction: 2 Gy
Plan Total Fractions Prescribed: 35
Plan Total Prescribed Dose: 70 Gy
Reference Point Dosage Given to Date: 14 Gy
Reference Point Session Dosage Given: 2 Gy
Session Number: 7

## 2022-08-29 ENCOUNTER — Other Ambulatory Visit: Payer: Self-pay

## 2022-08-29 ENCOUNTER — Inpatient Hospital Stay: Payer: 59

## 2022-08-29 ENCOUNTER — Ambulatory Visit
Admission: RE | Admit: 2022-08-29 | Discharge: 2022-08-29 | Disposition: A | Discharge status: Home / Self Care | Payer: 59 | Source: Ambulatory Visit | Attending: Radiation Oncology | Admitting: Radiation Oncology

## 2022-08-29 ENCOUNTER — Inpatient Hospital Stay: Payer: 59 | Admitting: Hematology and Oncology

## 2022-08-29 DIAGNOSIS — C140 Malignant neoplasm of pharynx, unspecified: Secondary | ICD-10-CM | POA: Diagnosis not present

## 2022-08-29 DIAGNOSIS — Z95828 Presence of other vascular implants and grafts: Secondary | ICD-10-CM

## 2022-08-29 DIAGNOSIS — C770 Secondary and unspecified malignant neoplasm of lymph nodes of head, face and neck: Secondary | ICD-10-CM | POA: Diagnosis not present

## 2022-08-29 DIAGNOSIS — R449 Unspecified symptoms and signs involving general sensations and perceptions: Secondary | ICD-10-CM | POA: Diagnosis not present

## 2022-08-29 DIAGNOSIS — Z5111 Encounter for antineoplastic chemotherapy: Secondary | ICD-10-CM | POA: Diagnosis not present

## 2022-08-29 DIAGNOSIS — Z510 Encounter for antineoplastic radiation therapy: Secondary | ICD-10-CM | POA: Diagnosis not present

## 2022-08-29 DIAGNOSIS — Z87891 Personal history of nicotine dependence: Secondary | ICD-10-CM | POA: Diagnosis not present

## 2022-08-29 LAB — CBC WITH DIFFERENTIAL (CANCER CENTER ONLY)
Abs Immature Granulocytes: 0.03 10*3/uL (ref 0.00–0.07)
Basophils Absolute: 0 10*3/uL (ref 0.0–0.1)
Basophils Relative: 0 %
Eosinophils Absolute: 0 10*3/uL (ref 0.0–0.5)
Eosinophils Relative: 0 %
HCT: 36.4 % (ref 36.0–46.0)
Hemoglobin: 12.8 g/dL (ref 12.0–15.0)
Immature Granulocytes: 0 %
Lymphocytes Relative: 11 %
Lymphs Abs: 1 10*3/uL (ref 0.7–4.0)
MCH: 32.6 pg (ref 26.0–34.0)
MCHC: 35.2 g/dL (ref 30.0–36.0)
MCV: 92.6 fL (ref 80.0–100.0)
Monocytes Absolute: 1.3 10*3/uL — ABNORMAL HIGH (ref 0.1–1.0)
Monocytes Relative: 14 %
Neutro Abs: 7.1 10*3/uL (ref 1.7–7.7)
Neutrophils Relative %: 75 %
Platelet Count: 292 10*3/uL (ref 150–400)
RBC: 3.93 MIL/uL (ref 3.87–5.11)
RDW: 11.6 % (ref 11.5–15.5)
WBC Count: 9.5 10*3/uL (ref 4.0–10.5)
nRBC: 0 % (ref 0.0–0.2)

## 2022-08-29 LAB — BASIC METABOLIC PANEL - CANCER CENTER ONLY
Anion gap: 7 (ref 5–15)
BUN: 28 mg/dL — ABNORMAL HIGH (ref 8–23)
CO2: 30 mmol/L (ref 22–32)
Calcium: 8.6 mg/dL — ABNORMAL LOW (ref 8.9–10.3)
Chloride: 99 mmol/L (ref 98–111)
Creatinine: 0.92 mg/dL (ref 0.44–1.00)
GFR, Estimated: >60 mL/min (ref >60)
Glucose, Bld: 99 mg/dL (ref 70–99)
Potassium: 4 mmol/L (ref 3.5–5.1)
Sodium: 136 mmol/L (ref 135–145)

## 2022-08-29 LAB — RAD ONC ARIA SESSION SUMMARY
Course Elapsed Days: 9
Plan Fractions Treated to Date: 8
Plan Prescribed Dose Per Fraction: 2 Gy
Plan Total Fractions Prescribed: 35
Plan Total Prescribed Dose: 70 Gy
Reference Point Dosage Given to Date: 16 Gy
Reference Point Session Dosage Given: 2 Gy
Session Number: 8

## 2022-08-29 LAB — MAGNESIUM: Magnesium: 1.8 mg/dL (ref 1.7–2.4)

## 2022-08-29 MED ORDER — HEPARIN SOD (PORK) LOCK FLUSH 100 UNIT/ML IV SOLN
500.0000 [IU] | Freq: Once | INTRAVENOUS | Status: AC
  Administered 2022-08-29: 500 [IU]

## 2022-08-29 MED ORDER — SODIUM CHLORIDE 0.9% FLUSH
10.0000 mL | Freq: Once | INTRAVENOUS | Status: AC
  Administered 2022-08-29: 10 mL

## 2022-08-29 MED FILL — Dexamethasone Sodium Phosphate Inj 100 MG/10ML: INTRAMUSCULAR | Qty: 1 | Status: AC

## 2022-08-29 MED FILL — Fosaprepitant Dimeglumine For IV Infusion 150 MG (Base Eq): INTRAVENOUS | Qty: 5 | Status: AC

## 2022-08-29 NOTE — Progress Notes (Signed)
[facility] CONSULT NOTE  Patient Care Team: [name], NP as PCP - General (Internal Medicine)  CHIEF COMPLAINTS/PURPOSE OF CONSULTATION:  SCC parapharyngeal space.  ASSESSMENT & PLAN:   This is a very pleasant 67 year old female patient with no significant past medical history referred to my dunk for new diagnosis of squamous cell carcinoma, unknown primary, HPV positive for consideration of concurrent chemoradiation.  She has mass lesion centered in the right carotid.  Her mass is centered in the right carotid, parapharyngeal and parotid spaces with extensive surrounding local invasion and extensive nodal involvement biopsy favor squamous cell carcinoma without a clear primary, p16 diffusely positive HPV focally positive. She met my colleague Dr. Basilio Cairo from radiation oncology who has discussed the case in detail with Dr. Christoper Allegra and the recommendation was to consider chemotherapy and radiation.  I have clearly discussed with her that we are not entirely certain that this is an oropharyngeal primary.  If this were indeed oropharyngeal primary, I agree that chemoradiation is more appropriate given she is a nonoperative candidate.  However in the light of this extensive tumor, it is likely reasonable to avoid more biopsies and initiate treatment sooner than later.   In case she does not obtain a complete response, she will then have to proceed with salvage surgery She is agreeable to concurrent chemo radiation and is here after second week of chemotherapy.  She tolerated cisplatin remarkably well.  She felt well with the dexamethasone.  She has however noticed some mild seepage around the G-tube, smells bad.  She is able to eat well.  There is no concern for infection around the G-tube upon review. Our NN will reach out to IR for G tube evaluation. She will otherwise proceed with week 2 of chemotherapy.    Thank you for consulting [place] in the care of this patient.  Please do  not hesitate to contact us with any additional questions or concerns.  HISTORY OF PRESENTING ILLNESS:  Nichole Dominguez 67 y.o. female is here because of Newly diagnosed Head and neck cancer.  This is a pleasant 67 year old female patient who presented to her PCP earlier this year with a right neck mass.  She was subsequently seen by ENT for further management.  She had laryngoscopy which revealed a relatively indurated mass lesion extending from the mid parotid gland to level 2 in the neck measuring approximately 4 cm.  Biopsy of the parotid were nondiagnostic.    CT neck done on June 26, 2022 with 4.4 x 3.0 x 4.1 cm soft tissue mass centered in the right parapharyngeal space favored to be arising from the deep lobe of right parotid gland most suspicious for primary malignant neoplasm with prominent right-sided cervical and upper mediastinal lymph nodes detailed above suspicious for metastatic spread of disease, paraganglioma can also occur in this location but is considered less likely.  Mass encasing a portion of right external carotid artery and partially encasing the right internal carotid artery.  She was seen by Dr. Christoper Allegra who recommended IR guided biopsy, fine-needle aspiration showed carcinoma with squamous features, large pleomorphic cells squamoid in appearance, diffusely positive for p40 and p16.  RNA in situ hybridization for high risk HPV is also focally positive  PET/CT shows large right level 2A nodal metastatic conglomerate most consistent with biopsy-proven p16 positive SCC mass abuts right oropharynx without discrete separate primary tumor.  Consider tissue sampling of oropharynx and nasopharynx if clinically indicated.  Not enlarged FDG avid contralateral left palatine  tonsil.  Scattered mild mediastinal FDG avid lymph nodes which are borderline in size and possibly reactive.  No definite distant metastasis.  Scattered pulmonary nodules below PET resolution.  She was seen by Dr. Basilio Cairo  from radiation oncology on 08/10/2022 and the recommendation was to consider concurrent chemoradiation.  According to Dr. Colletta Maryland note based on communication between her and Dr. Christoper Allegra he does not recommend elective procedures that will significantly delay treatment due to aggressive nature of her disease.  She will not undergo mucosal biopsies to determine if there is unknown primary.  She started first weekly cycle of cisplatin 08/24/2022. She said she feels quite well. She denies any adverse effect from chemotherapy. No nausea, vomiting, no change in hearing, no neuropathy from chemotherapy She is able to eat well she says. She says the G tube smells so terrible, there is seepage of liquid from G tube. No fevers or chills. Rest of the pertinent 10 point ROS reviewed and negative  MEDICAL HISTORY:  Past Medical History:  Diagnosis Date   Allergy    GERD (gastroesophageal reflux disease)    Osteopenia 01/04/2020   Plantar fasciitis    Seasonal allergies     SURGICAL HISTORY: Past Surgical History:  Procedure Laterality Date   COLONOSCOPY  2011   IR GASTROSTOMY TUBE MOD SED  08/21/2022   IR IMAGING GUIDED PORT INSERTION  08/21/2022   MOUTH SURGERY  01/2020   implant inserted   TUBAL LIGATION  1992    SOCIAL HISTORY: Social History   Socioeconomic History   Marital status: Married    Spouse name: Not on file   Number of children: Not on file   Years of education: Not on file   Highest education level: Not on file  Occupational History   Not on file  Tobacco Use   Smoking status: Former    Packs/day: 1.00    Years: 24.00    Additional pack years: 0.00    Total pack years: 24.00    Types: Cigarettes    Quit date: 10/10/1996    Years since quitting: 25.9   Smokeless tobacco: Never  Vaping Use   Vaping Use: Never used  Substance and Sexual Activity   Alcohol use: Yes    Alcohol/week: 0.0 standard drinks of alcohol    Comment: 1 shot vodka daily   Drug use: No   Sexual  activity: Yes    Partners: Male  Other Topics Concern   Not on file  Social History Narrative   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    Lives with husband   1 daughter in [place], 38 grandchildren   Son died   2 grown stepchildren- both Designer, multimedia, grandchildren   Enjoys Corporate investment banker   Social Determinants of Corporate investment banker Strain: Not on file  Food Insecurity: No Food Insecurity (08/09/2022)   Hunger Vital Sign    Worried About Running Out of Food in the Last Year: Never true    Ran Out of Food in the Last Year: Never true  Transportation Needs: No Transportation Needs (08/09/2022)   PRAPARE - Administrator, Civil Service (Medical): No    Lack of Transportation (Non-Medical): No  Physical Activity: Not on file  Stress: Not on file  Social Connections: Not on file  Intimate Partner Violence: Not At Risk (08/09/2022)   Humiliation, Afraid, Rape, and Kick questionnaire    Fear of Current or Ex-Partner: No    Emotionally Abused: No  Physically Abused: No    Sexually Abused: No    FAMILY HISTORY: Family History  Problem Relation Age of Onset   Diabetes Mother    Heart disease Mother        chf   Hypertension Mother    Cervical cancer Mother    Obesity Mother    Colon polyps Mother    Thyroid cancer Father    Cancer Father        thyroid x2   Heart attack Father    Breast cancer Sister    Lupus Sister    Obesity Maternal Grandmother    Diabetes Maternal Grandmother    Heart disease Maternal Grandmother    Hypertension Maternal Grandmother    Stroke Maternal Grandmother    Diabetes Mellitus II Paternal Grandmother        died from diabetic coma   Cancer Paternal Grandfather        not sure what type   Heart attack Son    Esophageal cancer Neg Hx    Stomach cancer Neg Hx    Rectal cancer Neg Hx    Colon cancer Neg Hx     ALLERGIES:  is allergic to amlodipine, darvon [propoxyphene], and sudafed [pseudoephedrine  hcl].  MEDICATIONS:  Current Outpatient Medications  Medication Sig Dispense Refill   acetaminophen (TYLENOL) 500 MG tablet Take 1,500 mg by mouth every 6 (six) hours as needed.     augmented betamethasone dipropionate (DIPROLENE-AF) 0.05 % ointment Apply topically 2 (two) times daily. 15 g 0   Calcium Carbonate (CALCIUM 600 HIGH POTENCY PO) Take 3 capsules by mouth daily. (Patient not taking: Reported on 08/09/2022)     cholecalciferol (VITAMIN D3) 25 MCG (1000 UNIT) tablet Take 1,000 Units by mouth daily. (Patient not taking: Reported on 08/09/2022)     dexamethasone (DECADRON) 4 MG tablet Take 2 tablets (8 mg) by mouth daily x 3 days starting the day after cisplatin chemotherapy. Take with food. 30 tablet 1   DULoxetine (CYMBALTA) 30 MG capsule Take 1 capsule (30 mg total) by mouth daily. 60 capsule 1   fluticasone (FLONASE) 50 MCG/ACT nasal spray USE 2 SPRAYS IN EACH NOSTRIL DAILY 48 g 3   gabapentin (NEURONTIN) 300 MG capsule Take 1 capsule (300 mg total) by mouth 4 (four) times daily. 120 capsule 0   gabapentin (NEURONTIN) 800 MG tablet Take 1 tablet (800 mg total) by mouth 3 (three) times daily. 90 tablet 2   HYDROcodone-acetaminophen (HYCET) 7.5-325 mg/15 ml solution Take 10-15 mLs by mouth every 4 (four) hours as needed for moderate pain or severe pain. Take with food. 300 mL 0   lidocaine (XYLOCAINE) 2 % solution Patient: Mix 1part 2% viscous lidocaine, 1part H20. Swish & swallow 10mL of diluted mixture, before meals and at bedtime, up to QID 200 mL 3   lidocaine-prilocaine (EMLA) cream Apply to affected area once 30 g 3   losartan (COZAAR) 50 MG tablet Take 1 tablet (50 mg total) by mouth daily. 90 tablet 1   Multiple Vitamins-Minerals (MULTIVITAMIN WITH MINERALS) tablet Take 1 tablet by mouth daily. (Patient not taking: Reported on 08/09/2022)     omeprazole (PRILOSEC) 20 MG capsule TAKE 1 CAPSULE (20 MG TOTAL) BY MOUTH EVERY MORNING. 90 capsule 1   ondansetron (ZOFRAN) 8 MG tablet  Take 1 tablet (8 mg total) by mouth every 8 (eight) hours as needed for nausea or vomiting. Start on the third day after cisplatin. 30 tablet 1   prochlorperazine (COMPAZINE) 10  MG tablet Take 1 tablet (10 mg total) by mouth every 6 (six) hours as needed (Nausea or vomiting). 30 tablet 1   Zinc 50 MG TABS Take 1 tablet by mouth daily as needed. (Patient not taking: Reported on 08/09/2022)     No current facility-administered medications for this visit.     PHYSICAL EXAMINATION: ECOG PERFORMANCE STATUS: 1 - Symptomatic but completely ambulatory  There were no vitals filed for this visit.  There were no vitals filed for this visit.   GENERAL:alert, no distress and comfortable SKIN: skin color, texture, turgor are normal, no rashes or significant lesions EYES: normal, conjunctiva are pink and non-injected, sclera clear OROPHARYNX:no exudate, no erythema and lips, buccal mucosa, and tongue normal  NECK: Large palpable mass, nontender on the right side of the neck extending behind the ear  LYMPH:  no other palpable abnormalities  LUNGS: clear to auscultation and percussion with normal breathing effort HEART: regular rate & rhythm and no murmurs and no lower extremity edema ABDOMEN:abdomen soft, non-tender and normal bowel sounds Musculoskeletal:no cyanosis of digits and no clubbing  PSYCH: alert & oriented x 3 with fluent speech NEURO: no focal motor/sensory deficits  LABORATORY DATA:  I have reviewed the data as listed Lab Results  Component Value Date   WBC 12.9 (H) 08/24/2022   HGB 13.5 08/24/2022   HCT 38.2 08/24/2022   MCV 93.6 08/24/2022   PLT 372 08/24/2022     Chemistry      Component Value Date/Time   NA 136 08/24/2022 0914   K 3.4 (L) 08/24/2022 0914   CL 98 08/24/2022 0914   CO2 28 08/24/2022 0914   BUN 27 (H) 08/24/2022 0914   CREATININE 1.01 (H) 08/24/2022 0914   CREATININE 1.02 (H) 12/28/2019 0749      Component Value Date/Time   CALCIUM 9.7 08/24/2022 0914    ALKPHOS 124 (H) 12/24/2018 0942   AST 18 12/28/2019 0749   ALT 49 (H) 12/28/2019 0749   BILITOT 0.7 12/28/2019 0749       RADIOGRAPHIC STUDIES: I have personally reviewed the radiological images as listed and agreed with the findings in the report. IR Gastrostomy Tube  Result Date: 08/21/2022 INDICATION: Head and neck malignancy EXAM: Percutaneous gastrostomy tube placement MEDICATIONS: Ancef 2 g IV; Antibiotics were administered within 1 hour of the procedure. Glucagon 1 mg IV ANESTHESIA/SEDATION: Moderate (conscious) sedation was employed during this procedure. A total of Versed 2 mg and Fentanyl 100 mcg was administered intravenously by the radiology nurse. Total intra-service moderate Sedation Time: 23 minutes. The patient's level of consciousness and vital signs were monitored continuously by radiology nursing throughout the procedure under my direct supervision. CONTRAST:  15 mL of Omnipaque 300 administered into the gastric lumen. FLUOROSCOPY: Radiation Exposure Index (as provided by the fluoroscopic device): 23 mGy Kerma COMPLICATIONS: None immediate. PROCEDURE: Informed written consent was obtained from the patient after a thorough discussion of the procedural risks, benefits and alternatives. All questions were addressed. Maximal Sterile Barrier Technique was utilized including caps, mask, sterile gowns, sterile gloves, sterile drape, hand hygiene and skin antiseptic. A timeout was performed prior to the initiation of the procedure. The left hepatic lobe margin was marked utilizing ultrasound guidance. The epigastric region was prepped and draped in the usual sterile fashion. The stomach was insufflated utilizing the NG tube. Following local lidocaine administration, 2 gastropexies were placed to secure the anterior wall of the stomach to the anterior abdominal wall. Percutaneous access obtained into the gastric antrum  at the center of the gastropexies with an 18 gauge needle. Guide wire  advanced into the gastric lumen. Serial dilation performed over guidewire and 18 French gastrostomy tube inserted through the peel-away sheath. The G tube retention balloon was inflated with 10 mL of dilute contrast and retracted to the anterior gastric wall. Contrast administrated through the gastrostomy tube opacified the gastric lumen. The insertion site was covered with sterile dressing. IMPRESSION: Successful insertion of 18 French balloon retention gastrostomy tube. Electronically Signed   By: Acquanetta Belling M.D.   On: 08/21/2022 14:36   IR IMAGING GUIDED PORT INSERTION  Result Date: 08/21/2022 INDICATION: Head and neck malignancy EXAM: IMPLANTED PORT A CATH PLACEMENT WITH ULTRASOUND AND FLUOROSCOPIC GUIDANCE MEDICATIONS: None ANESTHESIA/SEDATION: Moderate (conscious) sedation was employed during this procedure. A total of Versed 2 mg and Fentanyl 100 mcg was administered intravenously by the radiology nurse. Total intra-service moderate Sedation Time: 24 minutes. The patient's level of consciousness and vital signs were monitored continuously by radiology nursing throughout the procedure under my direct supervision. FLUOROSCOPY: Radiation Exposure Index (as provided by the fluoroscopic device): 1 mGy Kerma COMPLICATIONS: None immediate. PROCEDURE: The procedure, risks, benefits, and alternatives were explained to the patient. Questions regarding the procedure were encouraged and answered. The patient understands and consents to the procedure. A timeout was performed prior to the initiation of the procedure. Patient positioned supine on the angiography table. Right neck and anterior upper chest prepped and draped in the usual sterile fashion. All elements of maximal sterile barrier were utilized including, cap, mask, sterile gown, sterile gloves, large sterile drape, hand scrubbing and 2% Chlorhexidine for skin cleaning. The right internal jugular vein was evaluated with ultrasound and shown to be patent. A  permanent ultrasound image was obtained and placed in the patient's medical record. Local anesthesia was provided with 1% lidocaine with epinephrine. Using sterile gel and a sterile probe cover, the right internal jugular vein was entered with a 21 ga needle during real time ultrasound guidance. 0.018 inch guidewire placed and 21 ga needle exchanged for transitional dilator set. Utilizing fluoroscopy, 0.035 inch guidewire advanced centrally without difficulty. Attention then turned to the right anterior upper chest. Following local lidocaine administration, a port pocket was created. The catheter was connected to the port and brought from the pocket to the venotomy site through a subcutaneous tunnel. The catheter was cut to size and inserted through the peel-away sheath. The catheter tip was positioned at the cavoatrial junction using fluoroscopic guidance. The port aspirated and flushed well. The port pocket was closed with deep and superficial absorbable suture. The port pocket incision and venotomy sites were also sealed with Dermabond. IMPRESSION: Successful placement of a right internal jugular approach power injectable Port-A-Cath. The catheter is ready for immediate use. Electronically Signed   By: Acquanetta Belling M.D.   On: 08/21/2022 14:33    All questions were answered. The patient knows to call the clinic with any problems, questions or concerns. I spent  30 minutes in the care of this patient including H and P, review of records, counseling and coordination of care.     Rachel Moulds, MD 08/29/2022 7:40 AM

## 2022-08-29 NOTE — Progress Notes (Signed)
Oncology Nurse Navigator Documentation   I was notified by Dr. Al Pimple today that Nichole Dominguez was having liquid drainage from her PEG site. It is a clear/yellow drainage and it is coming from her stomach and leaking around the tube causing irritation and mild redness to the site. I notified IR and per their advice I removed the two buttons that were intact without difficulty and cinched the retention ring closer to her skin. I cleansed the area with water and added several split gauze over the site. I will visit her tomorrow while she is in infusion and she how it is holding up. She does have an appointment on Friday with IR as well for evaluation.  [name] RN, BSN, OCN Head & Neck Oncology Nurse Navigator [facility] Phone # [phone]  Fax # [phone]

## 2022-08-30 ENCOUNTER — Ambulatory Visit
Admission: RE | Admit: 2022-08-30 | Discharge: 2022-08-30 | Disposition: A | Discharge status: Home / Self Care | Payer: 59 | Source: Ambulatory Visit | Attending: Radiation Oncology | Admitting: Radiation Oncology

## 2022-08-30 ENCOUNTER — Inpatient Hospital Stay: Payer: 59

## 2022-08-30 ENCOUNTER — Other Ambulatory Visit: Payer: Self-pay

## 2022-08-30 VITALS — BP 130/66 | HR 92 | Temp 98.8°F | Resp 18 | Wt 155.5 lb

## 2022-08-30 DIAGNOSIS — Z87891 Personal history of nicotine dependence: Secondary | ICD-10-CM | POA: Diagnosis not present

## 2022-08-30 DIAGNOSIS — C140 Malignant neoplasm of pharynx, unspecified: Secondary | ICD-10-CM | POA: Diagnosis not present

## 2022-08-30 DIAGNOSIS — C770 Secondary and unspecified malignant neoplasm of lymph nodes of head, face and neck: Secondary | ICD-10-CM | POA: Diagnosis not present

## 2022-08-30 DIAGNOSIS — R449 Unspecified symptoms and signs involving general sensations and perceptions: Secondary | ICD-10-CM | POA: Diagnosis not present

## 2022-08-30 DIAGNOSIS — Z5111 Encounter for antineoplastic chemotherapy: Secondary | ICD-10-CM | POA: Diagnosis not present

## 2022-08-30 DIAGNOSIS — Z510 Encounter for antineoplastic radiation therapy: Secondary | ICD-10-CM | POA: Diagnosis not present

## 2022-08-30 LAB — RAD ONC ARIA SESSION SUMMARY
Course Elapsed Days: 10
Plan Fractions Treated to Date: 9
Plan Prescribed Dose Per Fraction: 2 Gy
Plan Total Fractions Prescribed: 35
Plan Total Prescribed Dose: 70 Gy
Reference Point Dosage Given to Date: 18 Gy
Reference Point Session Dosage Given: 2 Gy
Session Number: 9

## 2022-08-30 MED ORDER — MAGNESIUM SULFATE 2 GM/50ML IV SOLN
2.0000 g | Freq: Once | INTRAVENOUS | Status: AC
  Administered 2022-08-30: 2 g via INTRAVENOUS
  Filled 2022-08-30: qty 50

## 2022-08-30 MED ORDER — PALONOSETRON HCL INJECTION 0.25 MG/5ML
0.2500 mg | Freq: Once | INTRAVENOUS | Status: AC
  Administered 2022-08-30: 0.25 mg via INTRAVENOUS
  Filled 2022-08-30: qty 5

## 2022-08-30 MED ORDER — POTASSIUM CHLORIDE IN NACL 20-0.9 MEQ/L-% IV SOLN
Freq: Once | INTRAVENOUS | Status: AC
  Filled 2022-08-30: qty 1000

## 2022-08-30 MED ORDER — SODIUM CHLORIDE 0.9 % IV SOLN: Freq: Once | INTRAVENOUS | Status: AC

## 2022-08-30 MED ORDER — SODIUM CHLORIDE 0.9 % IV SOLN
40.0000 mg/m2 | Freq: Once | INTRAVENOUS | Status: AC
  Administered 2022-08-30: 71 mg via INTRAVENOUS
  Filled 2022-08-30: qty 71

## 2022-08-30 MED ORDER — HEPARIN SOD (PORK) LOCK FLUSH 100 UNIT/ML IV SOLN
500.0000 [IU] | Freq: Once | INTRAVENOUS | Status: AC | PRN
  Administered 2022-08-30: 500 [IU]

## 2022-08-30 MED ORDER — SODIUM CHLORIDE 0.9 % IV SOLN
10.0000 mg | Freq: Once | INTRAVENOUS | Status: AC
  Administered 2022-08-30: 10 mg via INTRAVENOUS
  Filled 2022-08-30: qty 10

## 2022-08-30 MED ORDER — SODIUM CHLORIDE 0.9% FLUSH
10.0000 mL | INTRAVENOUS | Status: DC | PRN
  Administered 2022-08-30: 10 mL

## 2022-08-30 MED ORDER — SODIUM CHLORIDE 0.9 % IV SOLN
150.0000 mg | Freq: Once | INTRAVENOUS | Status: AC
  Administered 2022-08-30: 150 mg via INTRAVENOUS
  Filled 2022-08-30: qty 150

## 2022-08-30 NOTE — Patient Instructions (Signed)
[facility]  Discharge Instructions: Thank you for choosing [facility] to provide your oncology and hematology care.   If you have a lab appointment with the Cancer Center, please go directly to the Cancer Center and check in at the registration area.   Wear comfortable clothing and clothing appropriate for easy access to any Portacath or PICC line.   We strive to give you quality time with your provider. You may need to reschedule your appointment if you arrive late (15 or more minutes).  Arriving late affects you and other patients whose appointments are after yours.  Also, if you miss three or more appointments without notifying the office, you may be dismissed from the clinic at the provider's discretion.      For prescription refill requests, have your pharmacy contact our office and allow 72 hours for refills to be completed.    Today you received the following chemotherapy and/or immunotherapy agents Cisplatin      To help prevent nausea and vomiting after your treatment, we encourage you to take your nausea medication as directed.  BELOW ARE SYMPTOMS THAT SHOULD BE REPORTED IMMEDIATELY: *FEVER GREATER THAN 100.4 F (38 C) OR HIGHER *CHILLS OR SWEATING *NAUSEA AND VOMITING THAT IS NOT CONTROLLED WITH YOUR NAUSEA MEDICATION *UNUSUAL SHORTNESS OF BREATH *UNUSUAL BRUISING OR BLEEDING *URINARY PROBLEMS (pain or burning when urinating, or frequent urination) *BOWEL PROBLEMS (unusual diarrhea, constipation, pain near the anus) TENDERNESS IN MOUTH AND THROAT WITH OR WITHOUT PRESENCE OF ULCERS (sore throat, sores in mouth, or a toothache) UNUSUAL RASH, SWELLING OR PAIN  UNUSUAL VAGINAL DISCHARGE OR ITCHING   Items with * indicate a potential emergency and should be followed up as soon as possible or go to the Emergency Department if any problems should occur.  Please show the CHEMOTHERAPY ALERT CARD or IMMUNOTHERAPY ALERT CARD at  check-in to the Emergency Department and triage nurse.  Should you have questions after your visit or need to cancel or reschedule your appointment, please contact [facility]  Dept: [phone]  and follow the prompts.  Office hours are 8:00 a.m. to 4:30 p.m. Monday - Friday. Please note that voicemails left after 4:00 p.m. may not be returned until the following business day.  We are closed weekends and major holidays. You have access to a nurse at all times for urgent questions. Please call the main number to the clinic Dept: [phone] and follow the prompts.   For any non-urgent questions, you may also contact your provider using MyChart. We now offer e-Visits for anyone 18 and older to request care online for non-urgent symptoms. For details visit [email or web address].   Also download the MyChart app! Go to the app store, search "MyChart", open the app, select , and log in with your MyChart username and password.   

## 2022-08-31 ENCOUNTER — Encounter (HOSPITAL_COMMUNITY): Payer: Self-pay

## 2022-08-31 ENCOUNTER — Inpatient Hospital Stay: Payer: 59 | Admitting: Dietician

## 2022-08-31 ENCOUNTER — Ambulatory Visit (HOSPITAL_COMMUNITY)
Admission: RE | Admit: 2022-08-31 | Discharge: 2022-08-31 | Disposition: A | Discharge status: Home / Self Care | Payer: 59 | Source: Ambulatory Visit | Attending: Interventional Radiology | Admitting: Interventional Radiology

## 2022-08-31 ENCOUNTER — Other Ambulatory Visit: Payer: Self-pay | Admitting: Internal Medicine

## 2022-08-31 ENCOUNTER — Ambulatory Visit
Admission: RE | Admit: 2022-08-31 | Discharge: 2022-08-31 | Disposition: A | Discharge status: Home / Self Care | Payer: 59 | Source: Ambulatory Visit | Attending: Radiation Oncology | Admitting: Radiation Oncology

## 2022-08-31 ENCOUNTER — Other Ambulatory Visit (HOSPITAL_COMMUNITY): Payer: Self-pay | Admitting: Interventional Radiology

## 2022-08-31 ENCOUNTER — Other Ambulatory Visit: Payer: Self-pay

## 2022-08-31 DIAGNOSIS — C140 Malignant neoplasm of pharynx, unspecified: Secondary | ICD-10-CM | POA: Diagnosis not present

## 2022-08-31 DIAGNOSIS — R449 Unspecified symptoms and signs involving general sensations and perceptions: Secondary | ICD-10-CM | POA: Diagnosis not present

## 2022-08-31 DIAGNOSIS — Z87891 Personal history of nicotine dependence: Secondary | ICD-10-CM | POA: Diagnosis not present

## 2022-08-31 DIAGNOSIS — Z510 Encounter for antineoplastic radiation therapy: Secondary | ICD-10-CM | POA: Diagnosis not present

## 2022-08-31 DIAGNOSIS — Z431 Encounter for attention to gastrostomy: Secondary | ICD-10-CM

## 2022-08-31 DIAGNOSIS — C770 Secondary and unspecified malignant neoplasm of lymph nodes of head, face and neck: Secondary | ICD-10-CM | POA: Diagnosis not present

## 2022-08-31 DIAGNOSIS — Z5111 Encounter for antineoplastic chemotherapy: Secondary | ICD-10-CM | POA: Diagnosis not present

## 2022-08-31 HISTORY — PX: IR PATIENT EVAL TECH 0-60 MINS: IMG5564

## 2022-08-31 LAB — RAD ONC ARIA SESSION SUMMARY
Course Elapsed Days: 11
Plan Fractions Treated to Date: 10
Plan Prescribed Dose Per Fraction: 2 Gy
Plan Total Fractions Prescribed: 35
Plan Total Prescribed Dose: 70 Gy
Reference Point Dosage Given to Date: 20 Gy
Reference Point Session Dosage Given: 2 Gy
Session Number: 10

## 2022-08-31 MED ORDER — GABAPENTIN 300 MG PO CAPS
300.0000 mg | ORAL_CAPSULE | Freq: Three times a day (TID) | ORAL | 3 refills | Status: DC
Start: 2022-08-31 — End: 2022-10-12

## 2022-08-31 NOTE — Procedures (Signed)
Patient was seen in IR today, for concern of leaking Gastrostomy Tube.  Gastrostomy tube was evaluated and noted that bumper was not flush against the skin, and tube was loose, allowing contents to leak around tube from skin.  Bumper was tightened, balloon was deflated and re-inflated to ensure full inflation.  Patient was educated on where bumper should be against the skin, how it should feel, and to keep an eye on if it starts to get loose, to cinch it back down to skin in appropriate position. Patient was told to contact IR with any questions or concerns regarding her gastrostomy tube.

## 2022-08-31 NOTE — Progress Notes (Signed)
Nutrition Assessment   Reason for Assessment: HNC   ASSESSMENT: 67 year old female with stage I SCC of parapharyngeal space, HPV positive. She is receiving concurrent chemoradiation with weekly cisplatin (start 5/17). S/p PEG 5/14. Patient is under the care of Dr. Basilio Cairo and Dr. Al Pimple  Past medical history includes GERD, HTN, osteopenia, facial nerve sensory disorder, eczema  Met with patient and husband in office. Patient reports significant drainage at PEG insertion site. This is going through her clothes and soaking her bed sheets. She has been using pads with tape to absorb the liquid. Patient recalls waking up to a piece of parsley that had come out around the tube from dinner the night before. Patient has IR evaluation later today. Patient reports eating a normal and tolerating regular textures without difficulty. Patient has noticed taste changes, recalls eating ham sandwich for lunch yesterday. This tasted horrible. Patient is drinking ~80 ounces of water. She is doing baking soda salt water rinses 2-3 times daily. Patient denies nausea, vomiting, diarrhea, constipation.   Nutrition Focused Physical Exam: deferred     Medications: D3, gabapentin, Hycet, MVI, prilosec, zofran, compazine, xylocaine, cymbalta, calcium carbonate, cozaar   Labs: reviewed    Anthropometrics:   Height: 5'1" Weight: 155 lb 8 oz UBW: 158 lb -164 lb (2023) BMI: 29.38   Estimated Energy Needs  Kcals: 2115-2400 Protein: 92-106 Fluid: >/= 2.1 L   NUTRITION DIAGNOSIS: Predicted suboptimal intake related to [facility] and associated treatment side effects as evidenced by      INTERVENTION:  Educated on small frequent meals and snacks with adequate calories and protein - handout with ideas provided Patient does not like Boost/Ensure as these are chalky. Suggested trying CIB powder mixed with fairlife milk - sample packets provided as well as shake recipes  Discussed strategies for altered taste. Continue  baking soda salt water rinses several times daily - handout with tips provided  IR to evaluate feeding tube this afternoon   MONITORING, EVALUATION, GOAL: Patient will tolerate increased calories and protein to minimize weight loss during treatment    Next Visit: Thursday May 30 in infusion with Britta Mccreedy

## 2022-09-04 ENCOUNTER — Other Ambulatory Visit: Payer: Self-pay

## 2022-09-04 ENCOUNTER — Ambulatory Visit
Admission: RE | Admit: 2022-09-04 | Discharge: 2022-09-04 | Disposition: A | Discharge status: Home / Self Care | Payer: 59 | Source: Ambulatory Visit | Attending: Radiation Oncology | Admitting: Radiation Oncology

## 2022-09-04 ENCOUNTER — Inpatient Hospital Stay: Payer: 59 | Admitting: Internal Medicine

## 2022-09-04 DIAGNOSIS — G518 Other disorders of facial nerve: Secondary | ICD-10-CM | POA: Diagnosis not present

## 2022-09-04 DIAGNOSIS — Z510 Encounter for antineoplastic radiation therapy: Secondary | ICD-10-CM | POA: Diagnosis not present

## 2022-09-04 DIAGNOSIS — R449 Unspecified symptoms and signs involving general sensations and perceptions: Secondary | ICD-10-CM | POA: Diagnosis not present

## 2022-09-04 DIAGNOSIS — Z5111 Encounter for antineoplastic chemotherapy: Secondary | ICD-10-CM | POA: Diagnosis not present

## 2022-09-04 DIAGNOSIS — C140 Malignant neoplasm of pharynx, unspecified: Secondary | ICD-10-CM | POA: Diagnosis not present

## 2022-09-04 DIAGNOSIS — Z87891 Personal history of nicotine dependence: Secondary | ICD-10-CM | POA: Diagnosis not present

## 2022-09-04 DIAGNOSIS — C770 Secondary and unspecified malignant neoplasm of lymph nodes of head, face and neck: Secondary | ICD-10-CM | POA: Diagnosis not present

## 2022-09-04 LAB — RAD ONC ARIA SESSION SUMMARY
Course Elapsed Days: 15
Plan Fractions Treated to Date: 11
Plan Prescribed Dose Per Fraction: 2 Gy
Plan Total Fractions Prescribed: 35
Plan Total Prescribed Dose: 70 Gy
Reference Point Dosage Given to Date: 22 Gy
Reference Point Session Dosage Given: 2 Gy
Session Number: 11

## 2022-09-04 NOTE — Progress Notes (Signed)
I connected with Nichole Dominguez on 09/04/22 at  9:30 AM EDT by telephone visit and verified that I am speaking with the correct person using two identifiers.  I discussed the limitations, risks, security and privacy concerns of performing an evaluation and management service by telemedicine and the availability of in-person appointments. I also discussed with the patient that there may be a patient responsible charge related to this service. The patient expressed understanding and agreed to proceed.  Other persons participating in the visit and their role in the encounter:  n/a   Patient's location:  Home Provider's location:  Office Chief Complaint:  Facial nerve sensory disorder  History of Present Ilness: Nichole Dominguez reports complete resolution of facial and ear pain symptoms since our prior visit.  She is no longer needing to dose the gabapentin or cymbalta.  Still undergoing radiation treatments with Dr. Basilio Cairo.  Observations: Language and cognition at baseline  Assessment and Plan: Facial nerve sensory disorder  Clinically improved, no further intervention.  May stop neuropathic pain meds if able.  Follow Up Instructions: RTC as needed  I discussed the assessment and treatment plan with the patient.  The patient was provided an opportunity to ask questions and all were answered.  The patient agreed with the plan and demonstrated understanding of the instructions.    The patient was advised to call back or seek an in-person evaluation if the symptoms worsen or if the condition fails to improve as anticipated.    Henreitta Leber, MD   I provided 18 minutes of non face-to-face telephone visit time during this encounter, and > 50% was spent counseling as documented under my assessment & plan.

## 2022-09-05 ENCOUNTER — Other Ambulatory Visit: Payer: Self-pay

## 2022-09-05 ENCOUNTER — Inpatient Hospital Stay (HOSPITAL_BASED_OUTPATIENT_CLINIC_OR_DEPARTMENT_OTHER): Payer: 59 | Admitting: Hematology and Oncology

## 2022-09-05 ENCOUNTER — Ambulatory Visit
Admission: RE | Admit: 2022-09-05 | Discharge: 2022-09-05 | Disposition: A | Discharge status: Home / Self Care | Payer: 59 | Source: Ambulatory Visit | Attending: Radiation Oncology | Admitting: Radiation Oncology

## 2022-09-05 ENCOUNTER — Inpatient Hospital Stay: Payer: 59

## 2022-09-05 DIAGNOSIS — R449 Unspecified symptoms and signs involving general sensations and perceptions: Secondary | ICD-10-CM | POA: Diagnosis not present

## 2022-09-05 DIAGNOSIS — Z5111 Encounter for antineoplastic chemotherapy: Secondary | ICD-10-CM | POA: Diagnosis not present

## 2022-09-05 DIAGNOSIS — Z510 Encounter for antineoplastic radiation therapy: Secondary | ICD-10-CM | POA: Diagnosis not present

## 2022-09-05 DIAGNOSIS — Z87891 Personal history of nicotine dependence: Secondary | ICD-10-CM | POA: Diagnosis not present

## 2022-09-05 DIAGNOSIS — C770 Secondary and unspecified malignant neoplasm of lymph nodes of head, face and neck: Secondary | ICD-10-CM

## 2022-09-05 DIAGNOSIS — Z95828 Presence of other vascular implants and grafts: Secondary | ICD-10-CM

## 2022-09-05 DIAGNOSIS — C140 Malignant neoplasm of pharynx, unspecified: Secondary | ICD-10-CM | POA: Diagnosis not present

## 2022-09-05 LAB — RAD ONC ARIA SESSION SUMMARY
Course Elapsed Days: 16
Plan Fractions Treated to Date: 12
Plan Prescribed Dose Per Fraction: 2 Gy
Plan Total Fractions Prescribed: 35
Plan Total Prescribed Dose: 70 Gy
Reference Point Dosage Given to Date: 24 Gy
Reference Point Session Dosage Given: 2 Gy
Session Number: 12

## 2022-09-05 LAB — CBC WITH DIFFERENTIAL (CANCER CENTER ONLY)
Abs Immature Granulocytes: 0.03 10*3/uL (ref 0.00–0.07)
Basophils Absolute: 0 10*3/uL (ref 0.0–0.1)
Basophils Relative: 0 %
Eosinophils Absolute: 0 10*3/uL (ref 0.0–0.5)
Eosinophils Relative: 1 %
HCT: 34.8 % — ABNORMAL LOW (ref 36.0–46.0)
Hemoglobin: 12.2 g/dL (ref 12.0–15.0)
Immature Granulocytes: 0 %
Lymphocytes Relative: 10 %
Lymphs Abs: 0.7 10*3/uL (ref 0.7–4.0)
MCH: 32.8 pg (ref 26.0–34.0)
MCHC: 35.1 g/dL (ref 30.0–36.0)
MCV: 93.5 fL (ref 80.0–100.0)
Monocytes Absolute: 0.7 10*3/uL (ref 0.1–1.0)
Monocytes Relative: 9 %
Neutro Abs: 6 10*3/uL (ref 1.7–7.7)
Neutrophils Relative %: 80 %
Platelet Count: 177 10*3/uL (ref 150–400)
RBC: 3.72 MIL/uL — ABNORMAL LOW (ref 3.87–5.11)
RDW: 11.5 % (ref 11.5–15.5)
WBC Count: 7.5 10*3/uL (ref 4.0–10.5)
nRBC: 0 % (ref 0.0–0.2)

## 2022-09-05 LAB — BASIC METABOLIC PANEL - CANCER CENTER ONLY
Anion gap: 5 (ref 5–15)
BUN: 27 mg/dL — ABNORMAL HIGH (ref 8–23)
CO2: 31 mmol/L (ref 22–32)
Calcium: 8.7 mg/dL — ABNORMAL LOW (ref 8.9–10.3)
Chloride: 100 mmol/L (ref 98–111)
Creatinine: 1.06 mg/dL — ABNORMAL HIGH (ref 0.44–1.00)
GFR, Estimated: 59 mL/min — ABNORMAL LOW (ref >60)
Glucose, Bld: 96 mg/dL (ref 70–99)
Potassium: 4.1 mmol/L (ref 3.5–5.1)
Sodium: 136 mmol/L (ref 135–145)

## 2022-09-05 MED ORDER — ALTEPLASE 2 MG IJ SOLR
2.0000 mg | Freq: Once | INTRAMUSCULAR | Status: DC
  Filled 2022-09-05: qty 2

## 2022-09-05 MED ORDER — HEPARIN SOD (PORK) LOCK FLUSH 100 UNIT/ML IV SOLN
500.0000 [IU] | Freq: Once | INTRAVENOUS | Status: AC
  Administered 2022-09-05: 500 [IU]

## 2022-09-05 MED ORDER — SODIUM CHLORIDE 0.9% FLUSH
10.0000 mL | Freq: Once | INTRAVENOUS | Status: AC
  Administered 2022-09-05: 10 mL

## 2022-09-05 MED FILL — Fosaprepitant Dimeglumine For IV Infusion 150 MG (Base Eq): INTRAVENOUS | Qty: 5 | Status: AC

## 2022-09-05 MED FILL — Dexamethasone Sodium Phosphate Inj 100 MG/10ML: INTRAMUSCULAR | Qty: 1 | Status: AC

## 2022-09-05 NOTE — Progress Notes (Signed)
[facility] CONSULT NOTE  Patient Care Team: [name], NP as PCP - General (Internal Medicine)  CHIEF COMPLAINTS/PURPOSE OF CONSULTATION:  SCC parapharyngeal space.  ASSESSMENT & PLAN:   This is a very pleasant 67 year old female patient with no significant past medical history referred to my dunk for new diagnosis of squamous cell carcinoma, unknown primary, HPV positive for consideration of concurrent chemoradiation.   She has mass lesion centered in the right carotid.  Her mass is centered in the right carotid, parapharyngeal and parotid spaces with extensive surrounding local invasion and extensive nodal involvement biopsy favor squamous cell carcinoma without a clear primary, p16 diffusely positive HPV focally positive. She met my colleague Dr. Basilio Cairo from radiation oncology who has discussed the case in detail with Dr. Christoper Allegra and the recommendation was to consider chemotherapy and radiation.  I have clearly discussed with her that we are not entirely certain that this is an oropharyngeal primary.  If this were indeed oropharyngeal primary, I agree that chemoradiation is more appropriate given she is a nonoperative candidate.  However in the light of this extensive tumor, it is likely reasonable to avoid more biopsies and initiate treatment sooner than later.   In case she does not obtain a complete response, she will then have to proceed with salvage surgery She is agreeable to concurrent chemo radiation and is here after third week of chemotherapy.  She is doing relatively well.  No adverse effects from chemotherapy reported.  Physical examination was remarkable improvement in right-sided cervical lymphadenopathy and no evidence of mucositis.  She is having some G-tube drainage issues but currently has not been using it.  No concern for infection.  She will proceed with treatment as planned for tomorrow, CBC and BMP reviewed and satisfactory.  She will get 2 g of  magnesium preemptively and we will also try to add magnesium to her labs from today.  Thank you for consulting [place] in the care of this patient.  Please do not hesitate to contact us with any additional questions or concerns.  HISTORY OF PRESENTING ILLNESS:  Nichole Dominguez 67 y.o. female is here because of Newly diagnosed Head and neck cancer.  This is a pleasant 67 year old female patient who presented to her PCP earlier this year with a right neck mass.  She was subsequently seen by ENT for further management.  She had laryngoscopy which revealed a relatively indurated mass lesion extending from the mid parotid gland to level 2 in the neck measuring approximately 4 cm.  Biopsy of the parotid were nondiagnostic.    CT neck done on June 26, 2022 with 4.4 x 3.0 x 4.1 cm soft tissue mass centered in the right parapharyngeal space favored to be arising from the deep lobe of right parotid gland most suspicious for primary malignant neoplasm with prominent right-sided cervical and upper mediastinal lymph nodes detailed above suspicious for metastatic spread of disease, paraganglioma can also occur in this location but is considered less likely.  Mass encasing a portion of right external carotid artery and partially encasing the right internal carotid artery.  She was seen by Dr. Christoper Allegra who recommended IR guided biopsy, fine-needle aspiration showed carcinoma with squamous features, large pleomorphic cells squamoid in appearance, diffusely positive for p40 and p16.  RNA in situ hybridization for high risk HPV is also focally positive  PET/CT shows large right level 2A nodal metastatic conglomerate most consistent with biopsy-proven p16 positive SCC mass abuts right oropharynx without discrete separate primary  tumor.  Consider tissue sampling of oropharynx and nasopharynx if clinically indicated.  Not enlarged FDG avid contralateral left palatine tonsil.  Scattered mild mediastinal FDG avid lymph nodes which are  borderline in size and possibly reactive.  No definite distant metastasis.  Scattered pulmonary nodules below PET resolution.  She was seen by Dr. Basilio Cairo from radiation oncology on 08/10/2022 and the recommendation was to consider concurrent chemoradiation.  According to Dr. Colletta Maryland note based on communication between her and Dr. Christoper Allegra he does not recommend elective procedures that will significantly delay treatment due to aggressive nature of her disease.  She will not undergo mucosal biopsies to determine if there is unknown primary.  She started first weekly cycle of cisplatin 08/24/2022. She is here before third weekly cycle of chemotherapy. She said she feels quite well. She denies any adverse effect from chemotherapy. No nausea, vomiting, no change in hearing, no neuropathy from chemotherapy She is able to eat well she says. Pain is much less in severity, taking pain meds once a day. Rest of the pertinent 10 point ROS reviewed and negative  MEDICAL HISTORY:  Past Medical History:  Diagnosis Date   Allergy    GERD (gastroesophageal reflux disease)    Osteopenia 01/04/2020   Plantar fasciitis    Seasonal allergies     SURGICAL HISTORY: Past Surgical History:  Procedure Laterality Date   COLONOSCOPY  2011   IR GASTROSTOMY TUBE MOD SED  08/21/2022   IR IMAGING GUIDED PORT INSERTION  08/21/2022   IR PATIENT EVAL TECH 0-60 MINS  08/31/2022   MOUTH SURGERY  01/2020   implant inserted   TUBAL LIGATION  1992    SOCIAL HISTORY: Social History   Socioeconomic History   Marital status: Married    Spouse name: Not on file   Number of children: Not on file   Years of education: Not on file   Highest education level: Not on file  Occupational History   Not on file  Tobacco Use   Smoking status: Former    Packs/day: 1.00    Years: 24.00    Additional pack years: 0.00    Total pack years: 24.00    Types: Cigarettes    Quit date: 10/10/1996    Years since quitting: 25.9   Smokeless  tobacco: Never  Vaping Use   Vaping Use: Never used  Substance and Sexual Activity   Alcohol use: Yes    Alcohol/week: 0.0 standard drinks of alcohol    Comment: 1 shot vodka daily   Drug use: No   Sexual activity: Yes    Partners: Male  Other Topics Concern   Not on file  Social History Narrative   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    Lives with husband   1 daughter in [place], 49 grandchildren   Son died   2 grown stepchildren- both Designer, multimedia, grandchildren   Enjoys Corporate investment banker   Social Determinants of Corporate investment banker Strain: Not on file  Food Insecurity: No Food Insecurity (08/09/2022)   Hunger Vital Sign    Worried About Running Out of Food in the Last Year: Never true    Ran Out of Food in the Last Year: Never true  Transportation Needs: No Transportation Needs (08/09/2022)   PRAPARE - Administrator, Civil Service (Medical): No    Lack of Transportation (Non-Medical): No  Physical Activity: Not on file  Stress: Not on file  Social Connections: Not on file  Intimate Partner Violence: Not At Risk (08/09/2022)   Humiliation, Afraid, Rape, and Kick questionnaire    Fear of Current or Ex-Partner: No    Emotionally Abused: No    Physically Abused: No    Sexually Abused: No    FAMILY HISTORY: Family History  Problem Relation Age of Onset   Diabetes Mother    Heart disease Mother        chf   Hypertension Mother    Cervical cancer Mother    Obesity Mother    Colon polyps Mother    Thyroid cancer Father    Cancer Father        thyroid x2   Heart attack Father    Breast cancer Sister    Lupus Sister    Obesity Maternal Grandmother    Diabetes Maternal Grandmother    Heart disease Maternal Grandmother    Hypertension Maternal Grandmother    Stroke Maternal Grandmother    Diabetes Mellitus II Paternal Grandmother        died from diabetic coma   Cancer Paternal Grandfather        not sure what type   Heart attack Son     Esophageal cancer Neg Hx    Stomach cancer Neg Hx    Rectal cancer Neg Hx    Colon cancer Neg Hx     ALLERGIES:  is allergic to amlodipine, darvon [propoxyphene], and sudafed [pseudoephedrine hcl].  MEDICATIONS:  Current Outpatient Medications  Medication Sig Dispense Refill   acetaminophen (TYLENOL) 500 MG tablet Take 1,500 mg by mouth every 6 (six) hours as needed.     augmented betamethasone dipropionate (DIPROLENE-AF) 0.05 % ointment Apply topically 2 (two) times daily. 15 g 0   Calcium Carbonate (CALCIUM 600 HIGH POTENCY PO) Take 3 capsules by mouth daily. (Patient not taking: Reported on 08/09/2022)     cholecalciferol (VITAMIN D3) 25 MCG (1000 UNIT) tablet Take 1,000 Units by mouth daily. (Patient not taking: Reported on 08/09/2022)     dexamethasone (DECADRON) 4 MG tablet Take 2 tablets (8 mg) by mouth daily x 3 days starting the day after cisplatin chemotherapy. Take with food. 30 tablet 1   DULoxetine (CYMBALTA) 30 MG capsule Take 1 capsule (30 mg total) by mouth daily. 60 capsule 1   fluticasone (FLONASE) 50 MCG/ACT nasal spray USE 2 SPRAYS IN EACH NOSTRIL DAILY 48 g 3   gabapentin (NEURONTIN) 300 MG capsule Take 1 capsule (300 mg total) by mouth 3 (three) times daily. 90 capsule 3   HYDROcodone-acetaminophen (HYCET) 7.5-325 mg/15 ml solution Take 10-15 mLs by mouth every 4 (four) hours as needed for moderate pain or severe pain. Take with food. 300 mL 0   lidocaine (XYLOCAINE) 2 % solution Patient: Mix 1part 2% viscous lidocaine, 1part H20. Swish & swallow 10mL of diluted mixture, before meals and at bedtime, up to QID 200 mL 3   lidocaine-prilocaine (EMLA) cream Apply to affected area once 30 g 3   losartan (COZAAR) 50 MG tablet Take 1 tablet (50 mg total) by mouth daily. 90 tablet 1   Multiple Vitamins-Minerals (MULTIVITAMIN WITH MINERALS) tablet Take 1 tablet by mouth daily. (Patient not taking: Reported on 08/09/2022)     omeprazole (PRILOSEC) 20 MG capsule TAKE 1 CAPSULE (20  MG TOTAL) BY MOUTH EVERY MORNING. 90 capsule 1   ondansetron (ZOFRAN) 8 MG tablet Take 1 tablet (8 mg total) by mouth every 8 (eight) hours as needed for nausea or vomiting. Start on the third  day after cisplatin. 30 tablet 1   prochlorperazine (COMPAZINE) 10 MG tablet Take 1 tablet (10 mg total) by mouth every 6 (six) hours as needed (Nausea or vomiting). 30 tablet 1   Zinc 50 MG TABS Take 1 tablet by mouth daily as needed. (Patient not taking: Reported on 08/09/2022)     No current facility-administered medications for this visit.     PHYSICAL EXAMINATION: ECOG PERFORMANCE STATUS: 1 - Symptomatic but completely ambulatory  Vitals:   09/05/22 1158  BP: 132/66  Pulse: (!) 7  Resp: 18  Temp: (!) 97.4 F (36.3 C)  SpO2: 100%    Filed Weights   09/05/22 1158  Weight: 151 lb 4.8 oz (68.6 kg)     GENERAL:alert, no distress and comfortable SKIN: skin color, texture, turgor are normal, no rashes or significant lesions EYES: normal, conjunctiva are pink and non-injected, sclera clear OROPHARYNX:no exudate, no erythema and lips, buccal mucosa, and tongue normal.  No mucositis NECK: Right-sided cervical adenopathy with significant improvement compared to last visit. LYMPH:  no other palpable abnormalities  LUNGS: clear to auscultation and percussion with normal breathing effort HEART: regular rate & rhythm and no murmurs and no lower extremity edema ABDOMEN:abdomen soft, non-tender and normal bowel sounds Musculoskeletal:no cyanosis of digits and no clubbing  PSYCH: alert & oriented x 3 with fluent speech NEURO: no focal motor/sensory deficits  LABORATORY DATA:  I have reviewed the data as listed Lab Results  Component Value Date   WBC 7.5 09/05/2022   HGB 12.2 09/05/2022   HCT 34.8 (L) 09/05/2022   MCV 93.5 09/05/2022   PLT 177 09/05/2022     Chemistry      Component Value Date/Time   NA 136 09/05/2022 1111   K 4.1 09/05/2022 1111   CL 100 09/05/2022 1111   CO2 31  09/05/2022 1111   BUN 27 (H) 09/05/2022 1111   CREATININE 1.06 (H) 09/05/2022 1111   CREATININE 1.02 (H) 12/28/2019 0749      Component Value Date/Time   CALCIUM 8.7 (L) 09/05/2022 1111   ALKPHOS 124 (H) 12/24/2018 0942   AST 18 12/28/2019 0749   ALT 49 (H) 12/28/2019 0749   BILITOT 0.7 12/28/2019 0749       RADIOGRAPHIC STUDIES: I have personally reviewed the radiological images as listed and agreed with the findings in the report. IR PATIENT EVAL TECH 0-60 MINS  Result Date: 08/31/2022 EXAM: ESTABLISHED PATIENT VISIT CHIEF COMPLAINT: See below HISTORY OF PRESENT ILLNESS: See below REVIEW OF SYSTEMS: See below PHYSICAL EXAMINATION: See below ASSESSMENT AND PLAN: Please refer to completed note in the electronic medical record on [place] Epic Nichole Banning, MD [facility] Radiology Electronically Signed   By: Nichole Dominguez M.D.   On: 08/31/2022 11:55   IR Gastrostomy Tube  Result Date: 08/21/2022 INDICATION: Head and neck malignancy EXAM: Percutaneous gastrostomy tube placement MEDICATIONS: Ancef 2 g IV; Antibiotics were administered within 1 hour of the procedure. Glucagon 1 mg IV ANESTHESIA/SEDATION: Moderate (conscious) sedation was employed during this procedure. A total of Versed 2 mg and Fentanyl 100 mcg was administered intravenously by the radiology nurse. Total intra-service moderate Sedation Time: 23 minutes. The patient's level of consciousness and vital signs were monitored continuously by radiology nursing throughout the procedure under my direct supervision. CONTRAST:  15 mL of Omnipaque 300 administered into the gastric lumen. FLUOROSCOPY: Radiation Exposure Index (as provided by the fluoroscopic device): 23 mGy Kerma COMPLICATIONS: None immediate. PROCEDURE: Informed written  consent was obtained from the patient after a thorough discussion of the procedural risks, benefits and alternatives. All questions were addressed. Maximal  Sterile Barrier Technique was utilized including caps, mask, sterile gowns, sterile gloves, sterile drape, hand hygiene and skin antiseptic. A timeout was performed prior to the initiation of the procedure. The left hepatic lobe margin was marked utilizing ultrasound guidance. The epigastric region was prepped and draped in the usual sterile fashion. The stomach was insufflated utilizing the NG tube. Following local lidocaine administration, 2 gastropexies were placed to secure the anterior wall of the stomach to the anterior abdominal wall. Percutaneous access obtained into the gastric antrum at the center of the gastropexies with an 18 gauge needle. Guide wire advanced into the gastric lumen. Serial dilation performed over guidewire and 18 French gastrostomy tube inserted through the peel-away sheath. The G tube retention balloon was inflated with 10 mL of dilute contrast and retracted to the anterior gastric wall. Contrast administrated through the gastrostomy tube opacified the gastric lumen. The insertion site was covered with sterile dressing. IMPRESSION: Successful insertion of 18 French balloon retention gastrostomy tube. Electronically Signed   By: Acquanetta Belling M.D.   On: 08/21/2022 14:36   IR IMAGING GUIDED PORT INSERTION  Result Date: 08/21/2022 INDICATION: Head and neck malignancy EXAM: IMPLANTED PORT A CATH PLACEMENT WITH ULTRASOUND AND FLUOROSCOPIC GUIDANCE MEDICATIONS: None ANESTHESIA/SEDATION: Moderate (conscious) sedation was employed during this procedure. A total of Versed 2 mg and Fentanyl 100 mcg was administered intravenously by the radiology nurse. Total intra-service moderate Sedation Time: 24 minutes. The patient's level of consciousness and vital signs were monitored continuously by radiology nursing throughout the procedure under my direct supervision. FLUOROSCOPY: Radiation Exposure Index (as provided by the fluoroscopic device): 1 mGy Kerma COMPLICATIONS: None immediate. PROCEDURE:  The procedure, risks, benefits, and alternatives were explained to the patient. Questions regarding the procedure were encouraged and answered. The patient understands and consents to the procedure. A timeout was performed prior to the initiation of the procedure. Patient positioned supine on the angiography table. Right neck and anterior upper chest prepped and draped in the usual sterile fashion. All elements of maximal sterile barrier were utilized including, cap, mask, sterile gown, sterile gloves, large sterile drape, hand scrubbing and 2% Chlorhexidine for skin cleaning. The right internal jugular vein was evaluated with ultrasound and shown to be patent. A permanent ultrasound image was obtained and placed in the patient's medical record. Local anesthesia was provided with 1% lidocaine with epinephrine. Using sterile gel and a sterile probe cover, the right internal jugular vein was entered with a 21 ga needle during real time ultrasound guidance. 0.018 inch guidewire placed and 21 ga needle exchanged for transitional dilator set. Utilizing fluoroscopy, 0.035 inch guidewire advanced centrally without difficulty. Attention then turned to the right anterior upper chest. Following local lidocaine administration, a port pocket was created. The catheter was connected to the port and brought from the pocket to the venotomy site through a subcutaneous tunnel. The catheter was cut to size and inserted through the peel-away sheath. The catheter tip was positioned at the cavoatrial junction using fluoroscopic guidance. The port aspirated and flushed well. The port pocket was closed with deep and superficial absorbable suture. The port pocket incision and venotomy sites were also sealed with Dermabond. IMPRESSION: Successful placement of a right internal jugular approach power injectable Port-A-Cath. The catheter is ready for immediate use. Electronically Signed   By: Acquanetta Belling M.D.   On: 08/21/2022 14:33  All  questions were answered. The patient knows to call the clinic with any problems, questions or concerns. I spent 20 minutes in the care of this patient including H and P, review of records, counseling and coordination of care.     Rachel Moulds, MD 09/05/2022 12:10 PM

## 2022-09-05 NOTE — Progress Notes (Signed)
Pt. Here for port flush and labs.  No blood return noted.  No cath flo given per Sara/RN.  Pt. Sent to lab for peripheral lab draw. Called Sam in [facility] to inform him that pt. Will be late to Radition Oncology due to trouble with her port.  Secure Chatted Valerie/RN to inform her of the above.

## 2022-09-06 ENCOUNTER — Other Ambulatory Visit: Payer: Self-pay

## 2022-09-06 ENCOUNTER — Ambulatory Visit
Admission: RE | Admit: 2022-09-06 | Discharge: 2022-09-06 | Disposition: A | Discharge status: Home / Self Care | Payer: 59 | Source: Ambulatory Visit | Attending: Radiation Oncology | Admitting: Radiation Oncology

## 2022-09-06 ENCOUNTER — Inpatient Hospital Stay: Payer: 59

## 2022-09-06 ENCOUNTER — Inpatient Hospital Stay: Payer: 59 | Admitting: Nutrition

## 2022-09-06 VITALS — BP 130/61 | HR 87 | Temp 98.4°F | Resp 16

## 2022-09-06 DIAGNOSIS — Z5111 Encounter for antineoplastic chemotherapy: Secondary | ICD-10-CM | POA: Diagnosis not present

## 2022-09-06 DIAGNOSIS — C140 Malignant neoplasm of pharynx, unspecified: Secondary | ICD-10-CM | POA: Diagnosis not present

## 2022-09-06 DIAGNOSIS — C770 Secondary and unspecified malignant neoplasm of lymph nodes of head, face and neck: Secondary | ICD-10-CM | POA: Diagnosis not present

## 2022-09-06 DIAGNOSIS — Z87891 Personal history of nicotine dependence: Secondary | ICD-10-CM | POA: Diagnosis not present

## 2022-09-06 DIAGNOSIS — Z510 Encounter for antineoplastic radiation therapy: Secondary | ICD-10-CM | POA: Diagnosis not present

## 2022-09-06 DIAGNOSIS — R449 Unspecified symptoms and signs involving general sensations and perceptions: Secondary | ICD-10-CM | POA: Diagnosis not present

## 2022-09-06 LAB — RAD ONC ARIA SESSION SUMMARY
Course Elapsed Days: 17
Plan Fractions Treated to Date: 13
Plan Prescribed Dose Per Fraction: 2 Gy
Plan Total Fractions Prescribed: 35
Plan Total Prescribed Dose: 70 Gy
Reference Point Dosage Given to Date: 26 Gy
Reference Point Session Dosage Given: 2 Gy
Session Number: 13

## 2022-09-06 LAB — MAGNESIUM: Magnesium: 1.7 mg/dL (ref 1.7–2.4)

## 2022-09-06 MED ORDER — SODIUM CHLORIDE 0.9% FLUSH
10.0000 mL | INTRAVENOUS | Status: DC | PRN
  Administered 2022-09-06: 10 mL

## 2022-09-06 MED ORDER — MAGNESIUM SULFATE 2 GM/50ML IV SOLN
2.0000 g | Freq: Once | INTRAVENOUS | Status: AC
  Administered 2022-09-06: 2 g via INTRAVENOUS
  Filled 2022-09-06: qty 50

## 2022-09-06 MED ORDER — HEPARIN SOD (PORK) LOCK FLUSH 100 UNIT/ML IV SOLN
500.0000 [IU] | Freq: Once | INTRAVENOUS | Status: AC | PRN
  Administered 2022-09-06: 500 [IU]

## 2022-09-06 MED ORDER — SODIUM CHLORIDE 0.9 % IV SOLN: Freq: Once | INTRAVENOUS | Status: AC

## 2022-09-06 MED ORDER — SODIUM CHLORIDE 0.9 % IV SOLN
40.4000 mg/m2 | Freq: Once | INTRAVENOUS | Status: AC
  Administered 2022-09-06: 72 mg via INTRAVENOUS
  Filled 2022-09-06: qty 72

## 2022-09-06 MED ORDER — SODIUM CHLORIDE 0.9 % IV SOLN
150.0000 mg | Freq: Once | INTRAVENOUS | Status: AC
  Administered 2022-09-06: 150 mg via INTRAVENOUS
  Filled 2022-09-06: qty 150

## 2022-09-06 MED ORDER — SODIUM CHLORIDE 0.9 % IV SOLN
10.0000 mg | Freq: Once | INTRAVENOUS | Status: AC
  Administered 2022-09-06: 10 mg via INTRAVENOUS
  Filled 2022-09-06: qty 10

## 2022-09-06 MED ORDER — PALONOSETRON HCL INJECTION 0.25 MG/5ML
0.2500 mg | Freq: Once | INTRAVENOUS | Status: AC
  Administered 2022-09-06: 0.25 mg via INTRAVENOUS
  Filled 2022-09-06: qty 5

## 2022-09-06 MED ORDER — POTASSIUM CHLORIDE IN NACL 20-0.9 MEQ/L-% IV SOLN
Freq: Once | INTRAVENOUS | Status: AC
  Filled 2022-09-06: qty 1000

## 2022-09-06 NOTE — Patient Instructions (Signed)
[facility]  Discharge Instructions: Thank you for choosing [facility] to provide your oncology and hematology care.   If you have a lab appointment with the Cancer Center, please go directly to the Cancer Center and check in at the registration area.   Wear comfortable clothing and clothing appropriate for easy access to any Portacath or PICC line.   We strive to give you quality time with your provider. You may need to reschedule your appointment if you arrive late (15 or more minutes).  Arriving late affects you and other patients whose appointments are after yours.  Also, if you miss three or more appointments without notifying the office, you may be dismissed from the clinic at the provider's discretion.      For prescription refill requests, have your pharmacy contact our office and allow 72 hours for refills to be completed.    Today you received the following chemotherapy and/or immunotherapy agents Cisplatin      To help prevent nausea and vomiting after your treatment, we encourage you to take your nausea medication as directed.  BELOW ARE SYMPTOMS THAT SHOULD BE REPORTED IMMEDIATELY: *FEVER GREATER THAN 100.4 F (38 C) OR HIGHER *CHILLS OR SWEATING *NAUSEA AND VOMITING THAT IS NOT CONTROLLED WITH YOUR NAUSEA MEDICATION *UNUSUAL SHORTNESS OF BREATH *UNUSUAL BRUISING OR BLEEDING *URINARY PROBLEMS (pain or burning when urinating, or frequent urination) *BOWEL PROBLEMS (unusual diarrhea, constipation, pain near the anus) TENDERNESS IN MOUTH AND THROAT WITH OR WITHOUT PRESENCE OF ULCERS (sore throat, sores in mouth, or a toothache) UNUSUAL RASH, SWELLING OR PAIN  UNUSUAL VAGINAL DISCHARGE OR ITCHING   Items with * indicate a potential emergency and should be followed up as soon as possible or go to the Emergency Department if any problems should occur.  Please show the CHEMOTHERAPY ALERT CARD or IMMUNOTHERAPY ALERT CARD at  check-in to the Emergency Department and triage nurse.  Should you have questions after your visit or need to cancel or reschedule your appointment, please contact [facility]  Dept: [phone]  and follow the prompts.  Office hours are 8:00 a.m. to 4:30 p.m. Monday - Friday. Please note that voicemails left after 4:00 p.m. may not be returned until the following business day.  We are closed weekends and major holidays. You have access to a nurse at all times for urgent questions. Please call the main number to the clinic Dept: [phone] and follow the prompts.   For any non-urgent questions, you may also contact your provider using MyChart. We now offer e-Visits for anyone 18 and older to request care online for non-urgent symptoms. For details visit [email or web address].   Also download the MyChart app! Go to the app store, search "MyChart", open the app, select , and log in with your MyChart username and password.   

## 2022-09-06 NOTE — Progress Notes (Signed)
Nutrition follow-up completed with patient during infusion for stage I SCC of parapharyngeal space, HPV positive.  She is receiving concurrent chemoradiation therapy with weekly cisplatin.  She is status post PEG on May 14.  She is followed by Dr. Basilio Cairo and Dr. Al Pimple.  Weight documented as 151 pounds 4.8 ounces on May 29.  This is decreased from 155 pounds 8 ounces May 24.  Patient reports increased taste alterations.  States she eats smaller amounts the past few days because her feeding tube continues to leak both solid food and liquids on a consistent basis.  Noted IR tightened the bumper on her tube recently however this has not resolved the leaking.  Noted red skin around feeding tube and yellow-tinged thickened discharge on gauze.  Patient states RN navigator is contacting IR.  Patient states she tried whole milk with Carnation breakfast essentials and was able to drink without difficulty.  She has consumed 1 every other day.  Estimated nutrition needs: 2115-2400 cal, 92-106 g protein, greater than 2.1 L fluid.  Nutrition diagnosis: Predicted sub-optimal energy intake continues.  Intervention: Education provided on improving taste alterations. Recommended Carnation breakfast essentials mixed with fair life milk daily. Continue baking soda and salt water rinses prior to food intake.  Provided additional instructions for altered taste. Encouraged increased fluid intake secondary to cisplatin. Await IR evaluation regarding leaking tube. Patient to continue flushing feeding tube with water daily and dressing changes. Questions answered.  Contact information has been provided.  Monitoring, evaluation, goals: Patient will tolerate adequate calories and protein minimize weight loss.  Next visit: Thursday, June 6 during infusion.  **Disclaimer: This note was dictated with voice recognition software. Similar sounding words can inadvertently be transcribed and this note may contain transcription  errors which may not have been corrected upon publication of note.**

## 2022-09-06 NOTE — Progress Notes (Signed)
Per Dr.Gudena OK to proceed with pre-hydration fluids prior to magnesium result today.

## 2022-09-07 ENCOUNTER — Other Ambulatory Visit: Payer: Self-pay

## 2022-09-07 ENCOUNTER — Ambulatory Visit (HOSPITAL_COMMUNITY)
Admission: RE | Admit: 2022-09-07 | Discharge: 2022-09-07 | Disposition: A | Discharge status: Home / Self Care | Payer: 59 | Source: Ambulatory Visit | Attending: Radiation Oncology | Admitting: Radiation Oncology

## 2022-09-07 ENCOUNTER — Other Ambulatory Visit: Payer: Self-pay | Admitting: Radiation Oncology

## 2022-09-07 ENCOUNTER — Ambulatory Visit
Admission: RE | Admit: 2022-09-07 | Discharge: 2022-09-07 | Disposition: A | Discharge status: Home / Self Care | Payer: 59 | Source: Ambulatory Visit | Attending: Radiation Oncology | Admitting: Radiation Oncology

## 2022-09-07 ENCOUNTER — Encounter (HOSPITAL_COMMUNITY): Payer: Self-pay | Admitting: Radiology

## 2022-09-07 DIAGNOSIS — C140 Malignant neoplasm of pharynx, unspecified: Secondary | ICD-10-CM | POA: Diagnosis not present

## 2022-09-07 DIAGNOSIS — C770 Secondary and unspecified malignant neoplasm of lymph nodes of head, face and neck: Secondary | ICD-10-CM

## 2022-09-07 DIAGNOSIS — Z510 Encounter for antineoplastic radiation therapy: Secondary | ICD-10-CM | POA: Diagnosis not present

## 2022-09-07 HISTORY — PX: IR PATIENT EVAL TECH 0-60 MINS: IMG5564

## 2022-09-07 LAB — RAD ONC ARIA SESSION SUMMARY
Course Elapsed Days: 18
Plan Fractions Treated to Date: 14
Plan Prescribed Dose Per Fraction: 2 Gy
Plan Total Fractions Prescribed: 35
Plan Total Prescribed Dose: 70 Gy
Reference Point Dosage Given to Date: 28 Gy
Reference Point Session Dosage Given: 2 Gy
Session Number: 14

## 2022-09-07 MED ORDER — SONAFINE EX EMUL
1.0000 | Freq: Two times a day (BID) | CUTANEOUS | Status: DC
  Administered 2022-09-07: 1 via TOPICAL

## 2022-09-07 NOTE — Progress Notes (Signed)
Patient ID: Nichole Dominguez, female   DOB: 08-16-1955, 66 y.o.   MRN: [id number] Patient presented to IR department for evaluation of leaking G-tube.  She currently has an 35 French balloon retention tube that was placed on 08/21/2022.  She has had issues with some persistent leaking from insertion site.  Patient is currently eating without difficulty.  Site was examined and outer disc was noted to be somewhat loose along the course of the tube allowing for easy movement away from skin surface with pt movement. Disc was made taut to skin surface and suture placed around top portion of disc to prevent easy migration up tube. No obvious signs of skin infection at this time. Rec application of desitin or vaseline to entry site as moisture barrier along with use of tape and/or clamp at top of disc intermittently to maintain disc at appropriate cinch level to prevent leaking/migration of disc. If problems persists may require exchange of tube over a wire under fluoroscopy as it is too early for bedside exchange due to immature tube tract. Pt also seen by Dr. Loreta Ave. Plans reviewed with pt/spouse.

## 2022-09-07 NOTE — Progress Notes (Signed)
Oncology Nurse Navigator Documentation   I met with Ms. Bamber yesterday and today to assess how her PEG was doing after IR evaluation on Friday. She continues to report copius amounts of drainage from the stoma site around the tube that is yellow/brown in color. The skin around the site is excoriated, painful, and red and she also reports pain on the inside of her abdomen when the drainage comes out. She notices this more when she eats and is mobile. I have contacted IR and they will see her again today to evaluate. Ms. Gally knows to call me if I can help her in any other way.  [name] RN, BSN, OCN Head & Neck Oncology Nurse Navigator [facility] Phone # [phone]  Fax # [phone]

## 2022-09-07 NOTE — Procedures (Signed)
Patient presented to IR department for evaluation of leaking G-tube.  She currently has an 21 French balloon retention tube that was placed on 08/21/2022.  She has had issues with some persistent leaking from insertion site.  Patient is currently eating without difficulty.  Site was examined and outer disc was noted to be somewhat loose along the course of the tube allowing for easy movement away from skin surface with pt movement. Disc was made taut to skin surface and suture placed around top portion of disc to prevent easy migration up tube. No obvious signs of skin infection at this time. Rec application of desitin or vaseline to entry site as moisture barrier along with use of tape and/or clamp at top of disc intermittently to maintain disc at appropriate cinch level to prevent leaking/migration of disc. If problems persists may require exchange of tube over a wire under fluoroscopy as it is too early for bedside exchange due to immature tube tract. Pt also seen by Dr. Loreta Ave. Plans reviewed with pt/spouse.

## 2022-09-09 ENCOUNTER — Ambulatory Visit: Payer: 59

## 2022-09-10 ENCOUNTER — Ambulatory Visit: Payer: 59

## 2022-09-10 ENCOUNTER — Other Ambulatory Visit: Payer: Self-pay

## 2022-09-10 ENCOUNTER — Ambulatory Visit
Admission: RE | Admit: 2022-09-10 | Discharge: 2022-09-10 | Disposition: A | Discharge status: Home / Self Care | Payer: 59 | Source: Ambulatory Visit | Attending: Radiation Oncology | Admitting: Radiation Oncology

## 2022-09-10 DIAGNOSIS — C760 Malignant neoplasm of head, face and neck: Secondary | ICD-10-CM | POA: Insufficient documentation

## 2022-09-10 DIAGNOSIS — R1310 Dysphagia, unspecified: Secondary | ICD-10-CM | POA: Diagnosis not present

## 2022-09-10 DIAGNOSIS — Z79899 Other long term (current) drug therapy: Secondary | ICD-10-CM | POA: Diagnosis not present

## 2022-09-10 DIAGNOSIS — C770 Secondary and unspecified malignant neoplasm of lymph nodes of head, face and neck: Secondary | ICD-10-CM | POA: Diagnosis not present

## 2022-09-10 DIAGNOSIS — E860 Dehydration: Secondary | ICD-10-CM | POA: Diagnosis not present

## 2022-09-10 DIAGNOSIS — Z5111 Encounter for antineoplastic chemotherapy: Secondary | ICD-10-CM | POA: Diagnosis present

## 2022-09-10 DIAGNOSIS — D696 Thrombocytopenia, unspecified: Secondary | ICD-10-CM | POA: Diagnosis not present

## 2022-09-10 DIAGNOSIS — C140 Malignant neoplasm of pharynx, unspecified: Secondary | ICD-10-CM | POA: Diagnosis present

## 2022-09-10 DIAGNOSIS — Z510 Encounter for antineoplastic radiation therapy: Secondary | ICD-10-CM | POA: Diagnosis not present

## 2022-09-10 LAB — RAD ONC ARIA SESSION SUMMARY
Course Elapsed Days: 21
Plan Fractions Treated to Date: 15
Plan Prescribed Dose Per Fraction: 2 Gy
Plan Total Fractions Prescribed: 35
Plan Total Prescribed Dose: 70 Gy
Reference Point Dosage Given to Date: 30 Gy
Reference Point Session Dosage Given: 2 Gy
Session Number: 15

## 2022-09-11 ENCOUNTER — Other Ambulatory Visit: Payer: Self-pay

## 2022-09-11 ENCOUNTER — Ambulatory Visit
Admission: RE | Admit: 2022-09-11 | Discharge: 2022-09-11 | Disposition: A | Discharge status: Home / Self Care | Payer: 59 | Source: Ambulatory Visit | Attending: Radiation Oncology | Admitting: Radiation Oncology

## 2022-09-11 DIAGNOSIS — Z79899 Other long term (current) drug therapy: Secondary | ICD-10-CM | POA: Diagnosis not present

## 2022-09-11 DIAGNOSIS — Z510 Encounter for antineoplastic radiation therapy: Secondary | ICD-10-CM | POA: Diagnosis not present

## 2022-09-11 DIAGNOSIS — C140 Malignant neoplasm of pharynx, unspecified: Secondary | ICD-10-CM | POA: Diagnosis not present

## 2022-09-11 DIAGNOSIS — E860 Dehydration: Secondary | ICD-10-CM | POA: Diagnosis not present

## 2022-09-11 DIAGNOSIS — C770 Secondary and unspecified malignant neoplasm of lymph nodes of head, face and neck: Secondary | ICD-10-CM | POA: Diagnosis not present

## 2022-09-11 DIAGNOSIS — R1310 Dysphagia, unspecified: Secondary | ICD-10-CM | POA: Diagnosis not present

## 2022-09-11 DIAGNOSIS — D696 Thrombocytopenia, unspecified: Secondary | ICD-10-CM | POA: Diagnosis not present

## 2022-09-11 DIAGNOSIS — Z5111 Encounter for antineoplastic chemotherapy: Secondary | ICD-10-CM | POA: Diagnosis not present

## 2022-09-11 LAB — RAD ONC ARIA SESSION SUMMARY
Course Elapsed Days: 22
Plan Fractions Treated to Date: 16
Plan Prescribed Dose Per Fraction: 2 Gy
Plan Total Fractions Prescribed: 35
Plan Total Prescribed Dose: 70 Gy
Reference Point Dosage Given to Date: 32 Gy
Reference Point Session Dosage Given: 2 Gy
Session Number: 16

## 2022-09-12 ENCOUNTER — Ambulatory Visit
Admission: RE | Admit: 2022-09-12 | Discharge: 2022-09-12 | Disposition: A | Discharge status: Home / Self Care | Payer: 59 | Source: Ambulatory Visit | Attending: Radiation Oncology | Admitting: Radiation Oncology

## 2022-09-12 ENCOUNTER — Other Ambulatory Visit: Payer: Self-pay

## 2022-09-12 ENCOUNTER — Inpatient Hospital Stay: Payer: 59 | Attending: Hematology and Oncology

## 2022-09-12 DIAGNOSIS — C770 Secondary and unspecified malignant neoplasm of lymph nodes of head, face and neck: Secondary | ICD-10-CM | POA: Diagnosis not present

## 2022-09-12 DIAGNOSIS — D696 Thrombocytopenia, unspecified: Secondary | ICD-10-CM | POA: Diagnosis not present

## 2022-09-12 DIAGNOSIS — Z5111 Encounter for antineoplastic chemotherapy: Secondary | ICD-10-CM | POA: Insufficient documentation

## 2022-09-12 DIAGNOSIS — Z79899 Other long term (current) drug therapy: Secondary | ICD-10-CM | POA: Insufficient documentation

## 2022-09-12 DIAGNOSIS — Z95828 Presence of other vascular implants and grafts: Secondary | ICD-10-CM

## 2022-09-12 DIAGNOSIS — C140 Malignant neoplasm of pharynx, unspecified: Secondary | ICD-10-CM | POA: Insufficient documentation

## 2022-09-12 DIAGNOSIS — Z510 Encounter for antineoplastic radiation therapy: Secondary | ICD-10-CM | POA: Diagnosis not present

## 2022-09-12 DIAGNOSIS — E860 Dehydration: Secondary | ICD-10-CM | POA: Insufficient documentation

## 2022-09-12 DIAGNOSIS — R1310 Dysphagia, unspecified: Secondary | ICD-10-CM | POA: Diagnosis not present

## 2022-09-12 LAB — RAD ONC ARIA SESSION SUMMARY
Course Elapsed Days: 23
Plan Fractions Treated to Date: 17
Plan Prescribed Dose Per Fraction: 2 Gy
Plan Total Fractions Prescribed: 35
Plan Total Prescribed Dose: 70 Gy
Reference Point Dosage Given to Date: 34 Gy
Reference Point Session Dosage Given: 2 Gy
Session Number: 17

## 2022-09-12 LAB — CBC WITH DIFFERENTIAL (CANCER CENTER ONLY)
Abs Immature Granulocytes: 0.02 10*3/uL (ref 0.00–0.07)
Basophils Absolute: 0 10*3/uL (ref 0.0–0.1)
Basophils Relative: 0 %
Eosinophils Absolute: 0 10*3/uL (ref 0.0–0.5)
Eosinophils Relative: 0 %
HCT: 33.2 % — ABNORMAL LOW (ref 36.0–46.0)
Hemoglobin: 11.7 g/dL — ABNORMAL LOW (ref 12.0–15.0)
Immature Granulocytes: 0 %
Lymphocytes Relative: 12 %
Lymphs Abs: 0.7 10*3/uL (ref 0.7–4.0)
MCH: 32.4 pg (ref 26.0–34.0)
MCHC: 35.2 g/dL (ref 30.0–36.0)
MCV: 92 fL (ref 80.0–100.0)
Monocytes Absolute: 0.4 10*3/uL (ref 0.1–1.0)
Monocytes Relative: 7 %
Neutro Abs: 4.4 10*3/uL (ref 1.7–7.7)
Neutrophils Relative %: 81 %
Platelet Count: 123 10*3/uL — ABNORMAL LOW (ref 150–400)
RBC: 3.61 MIL/uL — ABNORMAL LOW (ref 3.87–5.11)
RDW: 11.4 % — ABNORMAL LOW (ref 11.5–15.5)
WBC Count: 5.5 10*3/uL (ref 4.0–10.5)
nRBC: 0 % (ref 0.0–0.2)

## 2022-09-12 LAB — BASIC METABOLIC PANEL - CANCER CENTER ONLY
Anion gap: 7 (ref 5–15)
BUN: 21 mg/dL (ref 8–23)
CO2: 28 mmol/L (ref 22–32)
Calcium: 9.1 mg/dL (ref 8.9–10.3)
Chloride: 101 mmol/L (ref 98–111)
Creatinine: 0.89 mg/dL (ref 0.44–1.00)
GFR, Estimated: >60 mL/min (ref >60)
Glucose, Bld: 90 mg/dL (ref 70–99)
Potassium: 4.3 mmol/L (ref 3.5–5.1)
Sodium: 136 mmol/L (ref 135–145)

## 2022-09-12 LAB — MAGNESIUM: Magnesium: 1.5 mg/dL — ABNORMAL LOW (ref 1.7–2.4)

## 2022-09-12 MED ORDER — SODIUM CHLORIDE 0.9% FLUSH
10.0000 mL | Freq: Once | INTRAVENOUS | Status: AC
  Administered 2022-09-12: 10 mL

## 2022-09-12 MED ORDER — HEPARIN SOD (PORK) LOCK FLUSH 100 UNIT/ML IV SOLN
500.0000 [IU] | Freq: Once | INTRAVENOUS | Status: AC
  Administered 2022-09-12: 500 [IU]

## 2022-09-12 MED FILL — Dexamethasone Sodium Phosphate Inj 100 MG/10ML: INTRAMUSCULAR | Qty: 1 | Status: AC

## 2022-09-12 MED FILL — Fosaprepitant Dimeglumine For IV Infusion 150 MG (Base Eq): INTRAVENOUS | Qty: 5 | Status: AC

## 2022-09-13 ENCOUNTER — Encounter: Payer: Self-pay | Admitting: Adult Health

## 2022-09-13 ENCOUNTER — Inpatient Hospital Stay: Payer: 59 | Admitting: Adult Health

## 2022-09-13 ENCOUNTER — Other Ambulatory Visit: Payer: Self-pay

## 2022-09-13 ENCOUNTER — Ambulatory Visit
Admission: RE | Admit: 2022-09-13 | Discharge: 2022-09-13 | Disposition: A | Discharge status: Home / Self Care | Payer: 59 | Source: Ambulatory Visit | Attending: Radiation Oncology | Admitting: Radiation Oncology

## 2022-09-13 ENCOUNTER — Inpatient Hospital Stay: Payer: 59 | Admitting: Nutrition

## 2022-09-13 ENCOUNTER — Inpatient Hospital Stay: Payer: 59

## 2022-09-13 DIAGNOSIS — C770 Secondary and unspecified malignant neoplasm of lymph nodes of head, face and neck: Secondary | ICD-10-CM

## 2022-09-13 DIAGNOSIS — D696 Thrombocytopenia, unspecified: Secondary | ICD-10-CM | POA: Diagnosis not present

## 2022-09-13 DIAGNOSIS — C140 Malignant neoplasm of pharynx, unspecified: Secondary | ICD-10-CM | POA: Diagnosis not present

## 2022-09-13 DIAGNOSIS — R1310 Dysphagia, unspecified: Secondary | ICD-10-CM | POA: Diagnosis not present

## 2022-09-13 DIAGNOSIS — Z79899 Other long term (current) drug therapy: Secondary | ICD-10-CM | POA: Diagnosis not present

## 2022-09-13 DIAGNOSIS — Z5111 Encounter for antineoplastic chemotherapy: Secondary | ICD-10-CM | POA: Diagnosis not present

## 2022-09-13 DIAGNOSIS — Z510 Encounter for antineoplastic radiation therapy: Secondary | ICD-10-CM | POA: Diagnosis not present

## 2022-09-13 DIAGNOSIS — E860 Dehydration: Secondary | ICD-10-CM | POA: Diagnosis not present

## 2022-09-13 LAB — RAD ONC ARIA SESSION SUMMARY
Course Elapsed Days: 24
Plan Fractions Treated to Date: 18
Plan Prescribed Dose Per Fraction: 2 Gy
Plan Total Fractions Prescribed: 35
Plan Total Prescribed Dose: 70 Gy
Reference Point Dosage Given to Date: 36 Gy
Reference Point Session Dosage Given: 2 Gy
Session Number: 18

## 2022-09-13 MED ORDER — POTASSIUM CHLORIDE IN NACL 20-0.9 MEQ/L-% IV SOLN
Freq: Once | INTRAVENOUS | Status: AC
  Filled 2022-09-13: qty 1000

## 2022-09-13 MED ORDER — SODIUM CHLORIDE 0.9 % IV SOLN
150.0000 mg | Freq: Once | INTRAVENOUS | Status: AC
  Administered 2022-09-13: 150 mg via INTRAVENOUS
  Filled 2022-09-13: qty 150

## 2022-09-13 MED ORDER — HEPARIN SOD (PORK) LOCK FLUSH 100 UNIT/ML IV SOLN
500.0000 [IU] | Freq: Once | INTRAVENOUS | Status: AC | PRN
  Administered 2022-09-13: 500 [IU]

## 2022-09-13 MED ORDER — SODIUM CHLORIDE 0.9 % IV SOLN
40.0000 mg/m2 | Freq: Once | INTRAVENOUS | Status: AC
  Administered 2022-09-13: 71 mg via INTRAVENOUS
  Filled 2022-09-13: qty 71

## 2022-09-13 MED ORDER — SODIUM CHLORIDE 0.9 % IV SOLN: Freq: Once | INTRAVENOUS | Status: AC

## 2022-09-13 MED ORDER — MAGNESIUM SULFATE 2 GM/50ML IV SOLN
2.0000 g | Freq: Once | INTRAVENOUS | Status: AC
  Administered 2022-09-13: 2 g via INTRAVENOUS
  Filled 2022-09-13: qty 50

## 2022-09-13 MED ORDER — SODIUM CHLORIDE 0.9% FLUSH
10.0000 mL | INTRAVENOUS | Status: DC | PRN
  Administered 2022-09-13: 10 mL

## 2022-09-13 MED ORDER — PALONOSETRON HCL INJECTION 0.25 MG/5ML
0.2500 mg | Freq: Once | INTRAVENOUS | Status: AC
  Administered 2022-09-13: 0.25 mg via INTRAVENOUS
  Filled 2022-09-13: qty 5

## 2022-09-13 MED ORDER — SODIUM CHLORIDE 0.9 % IV SOLN
10.0000 mg | Freq: Once | INTRAVENOUS | Status: AC
  Administered 2022-09-13: 10 mg via INTRAVENOUS
  Filled 2022-09-13: qty 10

## 2022-09-13 NOTE — Patient Instructions (Signed)
[facility]  Discharge Instructions: Thank you for choosing [facility] to provide your oncology and hematology care.   If you have a lab appointment with the Cancer Center, please go directly to the Cancer Center and check in at the registration area.   Wear comfortable clothing and clothing appropriate for easy access to any Portacath or PICC line.   We strive to give you quality time with your provider. You may need to reschedule your appointment if you arrive late (15 or more minutes).  Arriving late affects you and other patients whose appointments are after yours.  Also, if you miss three or more appointments without notifying the office, you may be dismissed from the clinic at the provider's discretion.      For prescription refill requests, have your pharmacy contact our office and allow 72 hours for refills to be completed.    Today you received the following chemotherapy and/or immunotherapy agents Cisplatin      To help prevent nausea and vomiting after your treatment, we encourage you to take your nausea medication as directed.  BELOW ARE SYMPTOMS THAT SHOULD BE REPORTED IMMEDIATELY: *FEVER GREATER THAN 100.4 F (38 C) OR HIGHER *CHILLS OR SWEATING *NAUSEA AND VOMITING THAT IS NOT CONTROLLED WITH YOUR NAUSEA MEDICATION *UNUSUAL SHORTNESS OF BREATH *UNUSUAL BRUISING OR BLEEDING *URINARY PROBLEMS (pain or burning when urinating, or frequent urination) *BOWEL PROBLEMS (unusual diarrhea, constipation, pain near the anus) TENDERNESS IN MOUTH AND THROAT WITH OR WITHOUT PRESENCE OF ULCERS (sore throat, sores in mouth, or a toothache) UNUSUAL RASH, SWELLING OR PAIN  UNUSUAL VAGINAL DISCHARGE OR ITCHING   Items with * indicate a potential emergency and should be followed up as soon as possible or go to the Emergency Department if any problems should occur.  Please show the CHEMOTHERAPY ALERT CARD or IMMUNOTHERAPY ALERT CARD at  check-in to the Emergency Department and triage nurse.  Should you have questions after your visit or need to cancel or reschedule your appointment, please contact [facility]  Dept: [phone]  and follow the prompts.  Office hours are 8:00 a.m. to 4:30 p.m. Monday - Friday. Please note that voicemails left after 4:00 p.m. may not be returned until the following business day.  We are closed weekends and major holidays. You have access to a nurse at all times for urgent questions. Please call the main number to the clinic Dept: [phone] and follow the prompts.   For any non-urgent questions, you may also contact your provider using MyChart. We now offer e-Visits for anyone 18 and older to request care online for non-urgent symptoms. For details visit [email or web address].   Also download the MyChart app! Go to the app store, search "MyChart", open the app, select , and log in with your MyChart username and password.   

## 2022-09-13 NOTE — Progress Notes (Signed)
Nutrition follow-up completed with patient and husband during infusion for stage I SCC of parapharyngeal space.  Patient is receiving concurrent chemoradiation therapy with weekly cisplatin.  She is status post PEG placement on May 14.  She is followed by Dr. Basilio Cairo and Dr. Al Pimple.  Weight decreased and was documented as 148 pounds 14.4 ounces June 6.  This is decreased from 161 pounds 5 ounces on May 14.  This is a 23% weight loss in less than 3 months which is significant.  Labs noted: Magnesium 1.5, BUN 27, creatinine 1.06.  Patient reports she generally only eats twice a day.  This has not changed since diagnosis.  She is struggling with taste alterations.  She tries to drink Carnation breakfast essentials mixed with fair life milk at least 1 time a day.  Reports water tastes horrible but she is trying to drink adequate fluids.  Patient reports her feeding tube was evaluated and it is not leaking as much.  Also reports the skin around her feeding tube is healing.  She reports nausea in the evening which improves with antinausea medications.  She denies problems moving her bowels.  She is agreeable to starting tube feeding.  She wants to continue eating food.  Nutrition diagnosis:  Predicted sub-optimal energy intake has evolved into inadequate oral intake related to cancer as evidenced by 23% weight loss in less than 3 months.  Intervention: Educated patient on beginning Osmolite 1.5 via feeding tube.  Patient agreeable to starting with 1 carton 3 times daily with 60 mL free water before and after bolus feedings.  She would like feedings at 9 AM, 1 PM, 5 PM. Educated to either drink or flush tube with an additional 240 mL free water 3 times a day. She will continue to eat small meals between tube feeding. Patient does not want tube feeding orders placed today.  She feels confident that she will not need additional formula.  Explained there is at least 1 week delay in between ordering and  delivering formula and supplies.  Patient agrees to consider tube feeding orders next week.  She has a sample case of Osmolite 1.5 and adequate supplies to begin feedings today. Written tube feeding instructions provided.  Questions were answered.  Teach back method used.  Monitoring, evaluation, goals: Patient will tolerate increased calories and protein to minimize further weight loss. Will increase Osmolite 1.5 to goal rate of 6 cartons daily as tolerated as patient is unable to consume oral intake to maintain weight.  6 cartons Osmolite 1.5+ free water flushes provides 2130 cal, 89 g protein, 2166 mL free water/100% estimated calorie needs and 97% minimum protein needs.  Next visit: Thursday, June 13, during infusion with Rosalita Chessman.  **Disclaimer: This note was dictated with voice recognition software. Similar sounding words can inadvertently be transcribed and this note may contain transcription errors which may not have been corrected upon publication of note.** '

## 2022-09-13 NOTE — Assessment & Plan Note (Signed)
Nichole Dominguez is a 67 year old woman with stage I squamous cell carcinoma here today for follow-up and evaluation prior to receiving concurrent chemoradiation.  She is tolerating treatment well and has had no significant side effects.  She will continue with her treatment today.  Her magnesium is slightly decreased and we will give her additional magnesium today.  She will return daily for radiation and in 1 week for her her continued concurrent chemotherapy.

## 2022-09-13 NOTE — Progress Notes (Signed)
[facility] Cancer Follow up:    Nichole Craze, NP [address] [place] [address]   DIAGNOSIS:  Cancer Staging  Secondary malignant neoplasm of cervical lymph node (HCC) Staging form: Pharynx - HPV-Mediated Oropharynx, AJCC 8th Edition - Clinical stage from 08/10/2022: Stage I (cT0, cN1, cM0, p16+) - Signed by Lonie Peak, MD on 08/11/2022 Stage prefix: Initial diagnosis   SUMMARY OF ONCOLOGIC HISTORY: Oncology History  Secondary malignant neoplasm of cervical lymph node (HCC)  06/26/2022 Imaging   Neck CT: a 4.4 cm x 3.0 cm x 4.1 cm soft tissue mass centered in the right parapharyngeal space, favored to be arising from the deep lobe of the right parotid gland, 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 The mass appeared to encase a portion of the right external carotid artery and partially encases the right internal carotid artery Prominent right-sided cervical and upper mediastinal  lymph nodes were also appreciated, suspicious for metastatic spread of  disease    07/24/2022 Imaging   MRI of the face and neck  showed the infiltrative mass lesion centered in the right carotid, parapharyngeal and parotid spaces with extensive surrounding local invasion, and findings concerning for metastatic adenopathy in the right neck and additional nonspecific mildly enlarged and rounded lymph nodes involving the upper mediastinum and contralateral left jugular chain. MRI also showed evidence of tumor extension within close proximity to multiple skill base foramina, along with evidence of right hypoglossal nerve involvement contributing to right hemitongue denervation changes, and tumor thrombus within the encased right internal jugular vein. MRI otherwise showed no evidence of intracranial perineural tumor spread.    07/27/2022 Initial Biopsy   FNA of the right neck mass showed carcinoma with squamous features, favoring squamous cell carcinoma (however,  other tumors with squamous differentiation cannot entirely be excluded); P16 diffusely positive; HPV focally positive.    08/02/2022 PET scan   Whole body PET scan: the dominant right level IIa metastatic nodal mass measuring 4.9 x 2.5 cm with an SUV max of 15; multiple ipsilateral nodal metastases involving the right level 3, level 4, and level 5 lymph node stations including the dominant right level 3 nodal metastasis (measuring 1.1 cm) coming in contact with the right oropharynx without discrete primary tumor; FDG avidity within the left palatine tonsil (normal in size and with a max SUV of 4.4); and mildly FDG avid mediastinal, bihilar, and subcarinal lymph nodes. No other sites concerning for metastatic disease were appreciated elsewhere in the body.    08/10/2022 Cancer Staging   Staging form: Pharynx - HPV-Mediated Oropharynx, AJCC 8th Edition - Clinical stage from 08/10/2022: Stage I (cT0, cN1, cM0, p16+) - Signed by Lonie Peak, MD on 08/11/2022 Stage prefix: Initial diagnosis   08/21/2022 Procedure   Port placement and G-tube placement   08/23/2022 - 10/08/2022 Radiation Therapy   Concurrent radiation   08/24/2022 -  Chemotherapy   Patient is on Treatment Plan : HEAD/NECK Cisplatin (40) q7d       CURRENT THERAPY: concurrent chemoradiation  INTERVAL HISTORY: Nichole Dominguez 67 y.o. female returns for f/u prior to receiving chemotherapy with Cisplatin. She tells me she is tolerating treatment well.  She has had some issues with her G-tube which have improved greatly since the surgeon who placed the tube tightened up the insertion site.    She denies any significant issues such as dysphagia, odynophagia, peripheral neuropathy.    She has mild fatigue but is otherwise feeling well.  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  MEDICAL HISTORY: Past Medical History:  Diagnosis Date   Allergy    GERD (gastroesophageal reflux disease)    Osteopenia 01/04/2020   Plantar fasciitis    Seasonal allergies     SURGICAL HISTORY: Past Surgical History:  Procedure Laterality Date   COLONOSCOPY  2011   IR GASTROSTOMY TUBE MOD SED  08/21/2022   IR IMAGING GUIDED PORT INSERTION  08/21/2022   IR PATIENT EVAL TECH 0-60 MINS  08/31/2022   IR PATIENT EVAL TECH 0-60 MINS  09/07/2022   MOUTH SURGERY  01/2020   implant inserted   TUBAL LIGATION  1992    SOCIAL HISTORY: Social History   Socioeconomic History   Marital status: Married    Spouse name: Not on file   Number of children: Not on file   Years of education: Not on file   Highest education level: Not on file  Occupational History   Not on file  Tobacco Use   Smoking status: Former    Packs/day: 1.00    Years: 24.00    Additional pack years: 0.00    Total pack years: 24.00    Types: Cigarettes    Quit date: 10/10/1996    Years since quitting: 25.9   Smokeless tobacco: Never  Vaping Use   Vaping Use: Never used  Substance and Sexual Activity   Alcohol use: Yes    Alcohol/week: 0.0 standard drinks of alcohol    Comment: 1 shot vodka daily   Drug use: No   Sexual activity: Yes    Partners: Male  Other Topics Concern   Not on file  Social History Narrative   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    Lives with husband   1 daughter in [place], 1 grandchildren   Son died   2 grown stepchildren- both Designer, multimedia, grandchildren   Enjoys Corporate investment banker   Social Determinants  of Corporate investment banker Strain: Not on file  Food Insecurity: No Food Insecurity (08/09/2022)   Hunger Vital Sign    Worried About Running Out of Food in the Last Year: Never true    Ran Out of Food in the Last Year: Never true  Transportation Needs: No Transportation Needs (08/09/2022)   PRAPARE - Administrator, Civil Service (Medical): No    Lack of Transportation (Non-Medical): No  Physical Activity: Not on file  Stress: Not on file  Social Connections: Not on file  Intimate Partner Violence: Not At Risk (08/09/2022)   Humiliation, Afraid, Rape, and Kick questionnaire    Fear of Current or Ex-Partner: No    Emotionally Abused: No    Physically Abused: No    Sexually Abused: No    FAMILY HISTORY: Family History  Problem Relation Age of Onset   Diabetes Mother    Heart disease Mother        chf   Hypertension Mother    Cervical cancer Mother  Obesity Mother    Colon polyps Mother    Thyroid cancer Father    Cancer Father        thyroid x2   Heart attack Father    Breast cancer Sister    Lupus Sister    Obesity Maternal Grandmother    Diabetes Maternal Grandmother    Heart disease Maternal Grandmother    Hypertension Maternal Grandmother    Stroke Maternal Grandmother    Diabetes Mellitus II Paternal Grandmother        died from diabetic coma   Cancer Paternal Grandfather        not sure what type   Heart attack Son    Esophageal cancer Neg Hx    Stomach cancer Neg Hx    Rectal cancer Neg Hx    Colon cancer Neg Hx     Review of Systems  Constitutional:  Negative for appetite change, chills, fatigue, fever and unexpected weight change.  HENT:   Negative for hearing loss, lump/mass and trouble swallowing.   Eyes:  Negative for eye problems and icterus.  Respiratory:  Negative for chest tightness, cough and shortness of breath.   Cardiovascular:  Negative for chest pain, leg swelling and palpitations.  Gastrointestinal:  Negative for abdominal  distention, abdominal pain, constipation, diarrhea, nausea and vomiting.  Endocrine: Negative for hot flashes.  Genitourinary:  Negative for difficulty urinating.   Musculoskeletal:  Negative for arthralgias.  Skin:  Negative for itching and rash.  Neurological:  Negative for dizziness, extremity weakness, headaches and numbness.  Hematological:  Negative for adenopathy. Does not bruise/bleed easily.  Psychiatric/Behavioral:  Negative for depression. The patient is not nervous/anxious.       PHYSICAL EXAMINATION   Onc Performance Status - 09/13/22 0953       ECOG Perf Status   ECOG Perf Status Restricted in physically strenuous activity but ambulatory and able to carry out work of a light or sedentary nature, e.g., light house work, office work      KPS SCALE   KPS % SCORE Able to carry on normal activity, minor s/s of disease             Vitals:   09/13/22 0945  BP: 136/66  Pulse: 88  Resp: 18  Temp: (!) 97.4 F (36.3 C)  SpO2: 98%    Physical Exam Constitutional:      General: She is not in acute distress.    Appearance: Normal appearance. She is not toxic-appearing.  HENT:     Head: Normocephalic and atraumatic.     Mouth/Throat:     Mouth: Mucous membranes are moist.     Pharynx: Oropharynx is clear. No oropharyngeal exudate or posterior oropharyngeal erythema.  Eyes:     General: No scleral icterus. Cardiovascular:     Rate and Rhythm: Normal rate and regular rhythm.     Pulses: Normal pulses.     Heart sounds: Normal heart sounds.  Pulmonary:     Effort: Pulmonary effort is normal.     Breath sounds: Normal breath sounds.  Abdominal:     General: Abdomen is flat. Bowel sounds are normal. There is no distension.     Palpations: Abdomen is soft.     Tenderness: There is no abdominal tenderness.  Musculoskeletal:        General: No swelling.     Cervical back: Neck supple.  Lymphadenopathy:     Cervical: No cervical adenopathy.  Skin:    General:  Skin is warm  and dry.     Findings: No rash.  Neurological:     General: No focal deficit present.     Mental Status: She is alert.  Psychiatric:        Mood and Affect: Mood normal.        Behavior: Behavior normal.     LABORATORY DATA:  CBC    Component Value Date/Time   WBC 5.5 09/12/2022 0858   WBC 7.7 08/21/2022 1000   RBC 3.61 (L) 09/12/2022 0858   HGB 11.7 (L) 09/12/2022 0858   HCT 33.2 (L) 09/12/2022 0858   PLT 123 (L) 09/12/2022 0858   MCV 92.0 09/12/2022 0858   MCH 32.4 09/12/2022 0858   MCHC 35.2 09/12/2022 0858   RDW 11.4 (L) 09/12/2022 0858   LYMPHSABS 0.7 09/12/2022 0858   MONOABS 0.4 09/12/2022 0858   EOSABS 0.0 09/12/2022 0858   BASOSABS 0.0 09/12/2022 0858    CMP     Component Value Date/Time   NA 136 09/12/2022 0858   K 4.3 09/12/2022 0858   CL 101 09/12/2022 0858   CO2 28 09/12/2022 0858   GLUCOSE 90 09/12/2022 0858   BUN 21 09/12/2022 0858   CREATININE 0.89 09/12/2022 0858   CREATININE 1.02 (H) 12/28/2019 0749   CALCIUM 9.1 09/12/2022 0858   PROT 7.3 12/28/2019 0749   ALBUMIN 4.2 12/24/2018 0942   AST 18 12/28/2019 0749   ALT 49 (H) 12/28/2019 0749   ALKPHOS 124 (H) 12/24/2018 0942   BILITOT 0.7 12/28/2019 0749   GFRNONAA >60 09/12/2022 0858       ASSESSMENT and THERAPY PLAN:   Secondary malignant neoplasm of cervical lymph node (HCC) Mikalia is a 67 year old woman with stage I squamous cell carcinoma here today for follow-up and evaluation prior to receiving concurrent chemoradiation.  She is tolerating treatment well and has had no significant side effects.  She will continue with her treatment today.  Her magnesium is slightly decreased and we will give her additional magnesium today.  She will return daily for radiation and in 1 week for her her continued concurrent chemotherapy.    All questions were answered. The patient knows to call the clinic with any problems, questions or concerns. We can certainly see the patient much  sooner if necessary.  Total encounter time:20 minutes*in face-to-face visit time, chart review, lab review, care coordination, order entry, and documentation of the encounter time.    Lillard Anes, NP 09/13/22 11:10 AM Medical Oncology and Hematology [facility] [address] [place], [place] [address] Tel. [phone]    Fax. [phone]  *Total Encounter Time as defined by the Centers for Medicare and Medicaid Services includes, in addition to the face-to-face time of a patient visit (documented in the note above) non-face-to-face time: obtaining and reviewing outside history, ordering and reviewing medications, tests or procedures, care coordination (communications with other health care professionals or caregivers) and documentation in the medical record.

## 2022-09-14 ENCOUNTER — Ambulatory Visit
Admission: RE | Admit: 2022-09-14 | Discharge: 2022-09-14 | Disposition: A | Discharge status: Home / Self Care | Payer: 59 | Source: Ambulatory Visit | Attending: Radiation Oncology | Admitting: Radiation Oncology

## 2022-09-14 ENCOUNTER — Encounter: Payer: Self-pay | Admitting: Nutrition

## 2022-09-14 ENCOUNTER — Encounter: Payer: 59 | Admitting: Nutrition

## 2022-09-14 ENCOUNTER — Other Ambulatory Visit: Payer: Self-pay

## 2022-09-14 ENCOUNTER — Other Ambulatory Visit: Payer: Self-pay | Admitting: Nutrition

## 2022-09-14 DIAGNOSIS — C140 Malignant neoplasm of pharynx, unspecified: Secondary | ICD-10-CM | POA: Diagnosis not present

## 2022-09-14 DIAGNOSIS — D696 Thrombocytopenia, unspecified: Secondary | ICD-10-CM | POA: Diagnosis not present

## 2022-09-14 DIAGNOSIS — Z5111 Encounter for antineoplastic chemotherapy: Secondary | ICD-10-CM | POA: Diagnosis not present

## 2022-09-14 DIAGNOSIS — E860 Dehydration: Secondary | ICD-10-CM | POA: Diagnosis not present

## 2022-09-14 DIAGNOSIS — Z79899 Other long term (current) drug therapy: Secondary | ICD-10-CM | POA: Diagnosis not present

## 2022-09-14 DIAGNOSIS — R1310 Dysphagia, unspecified: Secondary | ICD-10-CM | POA: Diagnosis not present

## 2022-09-14 DIAGNOSIS — Z510 Encounter for antineoplastic radiation therapy: Secondary | ICD-10-CM | POA: Diagnosis not present

## 2022-09-14 DIAGNOSIS — C770 Secondary and unspecified malignant neoplasm of lymph nodes of head, face and neck: Secondary | ICD-10-CM | POA: Diagnosis not present

## 2022-09-14 LAB — RAD ONC ARIA SESSION SUMMARY
Course Elapsed Days: 25
Plan Fractions Treated to Date: 19
Plan Prescribed Dose Per Fraction: 2 Gy
Plan Total Fractions Prescribed: 35
Plan Total Prescribed Dose: 70 Gy
Reference Point Dosage Given to Date: 38 Gy
Reference Point Session Dosage Given: 2 Gy
Session Number: 19

## 2022-09-14 MED ORDER — OSMOLITE 1.5 CAL PO LIQD: ORAL | 12 refills | Status: DC

## 2022-09-14 NOTE — Progress Notes (Signed)
Patient called and has changed her mind about putting the order in for Osmolite 1.5. Reports she gave 3 cartons yesterday and today with good tolerance. Reports increased belching but feels this is due to the cow's milk she added by mouth yesterday. She is going to try Lactaid milk and assess tolerance. Reports she thinks she tolerates the TF better than the oral diet she has been consuming. Weighed 150 pounds on home scale this morning. She will continue to try to eat, but is agreeable to advancing Osmolite 1.5 to goal if unable to tolerate po diet. Will continue medications by mouth and water by mouth prn.  Orders written and [facility] Modena) notified.  Osmolite 1.5 via PEG, 2 cartons TID (9 am, 1 pm, and 5 pm) with 60 mL free water flush before and after bolus feedings.Drink or give via PEG additional 240 mL 3 times daily between bolus feeds.  6 cartons Osmolite 1.5+ free water flushes provides 2130 cal, 89 g protein, 2166 mL free water/100% estimated calorie needs and 97% minimum protein needs.   Follow up with Rosalita Chessman during infusion on June 13.

## 2022-09-17 ENCOUNTER — Ambulatory Visit
Admission: RE | Admit: 2022-09-17 | Discharge: 2022-09-17 | Disposition: A | Discharge status: Home / Self Care | Payer: 59 | Source: Ambulatory Visit | Attending: Radiation Oncology | Admitting: Radiation Oncology

## 2022-09-17 ENCOUNTER — Other Ambulatory Visit: Payer: Self-pay

## 2022-09-17 DIAGNOSIS — D696 Thrombocytopenia, unspecified: Secondary | ICD-10-CM | POA: Diagnosis not present

## 2022-09-17 DIAGNOSIS — E860 Dehydration: Secondary | ICD-10-CM | POA: Diagnosis not present

## 2022-09-17 DIAGNOSIS — C770 Secondary and unspecified malignant neoplasm of lymph nodes of head, face and neck: Secondary | ICD-10-CM | POA: Diagnosis not present

## 2022-09-17 DIAGNOSIS — C140 Malignant neoplasm of pharynx, unspecified: Secondary | ICD-10-CM | POA: Diagnosis not present

## 2022-09-17 DIAGNOSIS — R1310 Dysphagia, unspecified: Secondary | ICD-10-CM | POA: Diagnosis not present

## 2022-09-17 DIAGNOSIS — Z510 Encounter for antineoplastic radiation therapy: Secondary | ICD-10-CM | POA: Diagnosis not present

## 2022-09-17 DIAGNOSIS — Z5111 Encounter for antineoplastic chemotherapy: Secondary | ICD-10-CM | POA: Diagnosis not present

## 2022-09-17 DIAGNOSIS — Z79899 Other long term (current) drug therapy: Secondary | ICD-10-CM | POA: Diagnosis not present

## 2022-09-17 LAB — RAD ONC ARIA SESSION SUMMARY
Course Elapsed Days: 28
Plan Fractions Treated to Date: 20
Plan Prescribed Dose Per Fraction: 2 Gy
Plan Total Fractions Prescribed: 35
Plan Total Prescribed Dose: 70 Gy
Reference Point Dosage Given to Date: 40 Gy
Reference Point Session Dosage Given: 2 Gy
Session Number: 20

## 2022-09-18 ENCOUNTER — Other Ambulatory Visit: Payer: Self-pay

## 2022-09-18 ENCOUNTER — Ambulatory Visit
Admission: RE | Admit: 2022-09-18 | Discharge: 2022-09-18 | Disposition: A | Discharge status: Home / Self Care | Payer: 59 | Source: Ambulatory Visit | Attending: Radiation Oncology | Admitting: Radiation Oncology

## 2022-09-18 DIAGNOSIS — R1310 Dysphagia, unspecified: Secondary | ICD-10-CM | POA: Diagnosis not present

## 2022-09-18 DIAGNOSIS — C140 Malignant neoplasm of pharynx, unspecified: Secondary | ICD-10-CM | POA: Diagnosis not present

## 2022-09-18 DIAGNOSIS — Z510 Encounter for antineoplastic radiation therapy: Secondary | ICD-10-CM | POA: Diagnosis not present

## 2022-09-18 DIAGNOSIS — Z5111 Encounter for antineoplastic chemotherapy: Secondary | ICD-10-CM | POA: Diagnosis not present

## 2022-09-18 DIAGNOSIS — D696 Thrombocytopenia, unspecified: Secondary | ICD-10-CM | POA: Diagnosis not present

## 2022-09-18 DIAGNOSIS — E860 Dehydration: Secondary | ICD-10-CM | POA: Diagnosis not present

## 2022-09-18 DIAGNOSIS — Z79899 Other long term (current) drug therapy: Secondary | ICD-10-CM | POA: Diagnosis not present

## 2022-09-18 DIAGNOSIS — C770 Secondary and unspecified malignant neoplasm of lymph nodes of head, face and neck: Secondary | ICD-10-CM | POA: Diagnosis not present

## 2022-09-18 LAB — RAD ONC ARIA SESSION SUMMARY
Course Elapsed Days: 29
Plan Fractions Treated to Date: 21
Plan Prescribed Dose Per Fraction: 2 Gy
Plan Total Fractions Prescribed: 35
Plan Total Prescribed Dose: 70 Gy
Reference Point Dosage Given to Date: 42 Gy
Reference Point Session Dosage Given: 2 Gy
Session Number: 21

## 2022-09-19 ENCOUNTER — Other Ambulatory Visit: Payer: Self-pay

## 2022-09-19 ENCOUNTER — Ambulatory Visit
Admission: RE | Admit: 2022-09-19 | Discharge: 2022-09-19 | Disposition: A | Discharge status: Home / Self Care | Payer: 59 | Source: Ambulatory Visit | Attending: Radiation Oncology | Admitting: Radiation Oncology

## 2022-09-19 DIAGNOSIS — C140 Malignant neoplasm of pharynx, unspecified: Secondary | ICD-10-CM | POA: Diagnosis not present

## 2022-09-19 DIAGNOSIS — E860 Dehydration: Secondary | ICD-10-CM | POA: Diagnosis not present

## 2022-09-19 DIAGNOSIS — C770 Secondary and unspecified malignant neoplasm of lymph nodes of head, face and neck: Secondary | ICD-10-CM | POA: Diagnosis not present

## 2022-09-19 DIAGNOSIS — Z510 Encounter for antineoplastic radiation therapy: Secondary | ICD-10-CM | POA: Diagnosis not present

## 2022-09-19 DIAGNOSIS — Z79899 Other long term (current) drug therapy: Secondary | ICD-10-CM | POA: Diagnosis not present

## 2022-09-19 DIAGNOSIS — D696 Thrombocytopenia, unspecified: Secondary | ICD-10-CM | POA: Diagnosis not present

## 2022-09-19 DIAGNOSIS — R1310 Dysphagia, unspecified: Secondary | ICD-10-CM | POA: Diagnosis not present

## 2022-09-19 DIAGNOSIS — Z5111 Encounter for antineoplastic chemotherapy: Secondary | ICD-10-CM | POA: Diagnosis not present

## 2022-09-19 LAB — RAD ONC ARIA SESSION SUMMARY
Course Elapsed Days: 30
Plan Fractions Treated to Date: 22
Plan Prescribed Dose Per Fraction: 2 Gy
Plan Total Fractions Prescribed: 35
Plan Total Prescribed Dose: 70 Gy
Reference Point Dosage Given to Date: 44 Gy
Reference Point Session Dosage Given: 2 Gy
Session Number: 22

## 2022-09-20 ENCOUNTER — Other Ambulatory Visit: Payer: Self-pay

## 2022-09-20 ENCOUNTER — Inpatient Hospital Stay: Payer: 59

## 2022-09-20 ENCOUNTER — Ambulatory Visit
Admission: RE | Admit: 2022-09-20 | Discharge: 2022-09-20 | Disposition: A | Discharge status: Home / Self Care | Payer: 59 | Source: Ambulatory Visit | Attending: Radiation Oncology | Admitting: Radiation Oncology

## 2022-09-20 ENCOUNTER — Inpatient Hospital Stay (HOSPITAL_BASED_OUTPATIENT_CLINIC_OR_DEPARTMENT_OTHER): Payer: 59 | Admitting: Hematology and Oncology

## 2022-09-20 ENCOUNTER — Inpatient Hospital Stay: Payer: 59 | Admitting: Dietician

## 2022-09-20 VITALS — BP 142/75 | HR 87 | Temp 98.3°F | Resp 20

## 2022-09-20 DIAGNOSIS — C770 Secondary and unspecified malignant neoplasm of lymph nodes of head, face and neck: Secondary | ICD-10-CM

## 2022-09-20 DIAGNOSIS — E860 Dehydration: Secondary | ICD-10-CM | POA: Diagnosis not present

## 2022-09-20 DIAGNOSIS — D696 Thrombocytopenia, unspecified: Secondary | ICD-10-CM | POA: Diagnosis not present

## 2022-09-20 DIAGNOSIS — R1310 Dysphagia, unspecified: Secondary | ICD-10-CM | POA: Diagnosis not present

## 2022-09-20 DIAGNOSIS — Z510 Encounter for antineoplastic radiation therapy: Secondary | ICD-10-CM | POA: Diagnosis not present

## 2022-09-20 DIAGNOSIS — Z79899 Other long term (current) drug therapy: Secondary | ICD-10-CM | POA: Diagnosis not present

## 2022-09-20 DIAGNOSIS — Z5111 Encounter for antineoplastic chemotherapy: Secondary | ICD-10-CM | POA: Diagnosis not present

## 2022-09-20 DIAGNOSIS — C140 Malignant neoplasm of pharynx, unspecified: Secondary | ICD-10-CM | POA: Diagnosis not present

## 2022-09-20 LAB — RAD ONC ARIA SESSION SUMMARY
Course Elapsed Days: 31
Plan Fractions Treated to Date: 23
Plan Prescribed Dose Per Fraction: 2 Gy
Plan Total Fractions Prescribed: 35
Plan Total Prescribed Dose: 70 Gy
Reference Point Dosage Given to Date: 46 Gy
Reference Point Session Dosage Given: 2 Gy
Session Number: 23

## 2022-09-20 LAB — CBC WITH DIFFERENTIAL (CANCER CENTER ONLY)
Abs Immature Granulocytes: 0.03 10*3/uL (ref 0.00–0.07)
Basophils Absolute: 0 10*3/uL (ref 0.0–0.1)
Basophils Relative: 0 %
Eosinophils Absolute: 0 10*3/uL (ref 0.0–0.5)
Eosinophils Relative: 0 %
HCT: 31.6 % — ABNORMAL LOW (ref 36.0–46.0)
Hemoglobin: 11.2 g/dL — ABNORMAL LOW (ref 12.0–15.0)
Immature Granulocytes: 1 %
Lymphocytes Relative: 10 %
Lymphs Abs: 0.4 10*3/uL — ABNORMAL LOW (ref 0.7–4.0)
MCH: 32.1 pg (ref 26.0–34.0)
MCHC: 35.4 g/dL (ref 30.0–36.0)
MCV: 90.5 fL (ref 80.0–100.0)
Monocytes Absolute: 0.5 10*3/uL (ref 0.1–1.0)
Monocytes Relative: 12 %
Neutro Abs: 3.1 10*3/uL (ref 1.7–7.7)
Neutrophils Relative %: 77 %
Platelet Count: 162 10*3/uL (ref 150–400)
RBC: 3.49 MIL/uL — ABNORMAL LOW (ref 3.87–5.11)
RDW: 11.3 % — ABNORMAL LOW (ref 11.5–15.5)
WBC Count: 4 10*3/uL (ref 4.0–10.5)
nRBC: 0 % (ref 0.0–0.2)

## 2022-09-20 LAB — BASIC METABOLIC PANEL - CANCER CENTER ONLY
Anion gap: 9 (ref 5–15)
BUN: 19 mg/dL (ref 8–23)
CO2: 27 mmol/L (ref 22–32)
Calcium: 9.2 mg/dL (ref 8.9–10.3)
Chloride: 98 mmol/L (ref 98–111)
Creatinine: 0.92 mg/dL (ref 0.44–1.00)
GFR, Estimated: >60 mL/min (ref >60)
Glucose, Bld: 125 mg/dL — ABNORMAL HIGH (ref 70–99)
Potassium: 4.1 mmol/L (ref 3.5–5.1)
Sodium: 134 mmol/L — ABNORMAL LOW (ref 135–145)

## 2022-09-20 LAB — MAGNESIUM: Magnesium: 1.2 mg/dL — ABNORMAL LOW (ref 1.7–2.4)

## 2022-09-20 MED ORDER — SODIUM CHLORIDE 0.9 % IV SOLN
40.0000 mg/m2 | Freq: Once | INTRAVENOUS | Status: AC
  Administered 2022-09-20: 71 mg via INTRAVENOUS
  Filled 2022-09-20: qty 71

## 2022-09-20 MED ORDER — SODIUM CHLORIDE 0.9 % IV SOLN
10.0000 mg | Freq: Once | INTRAVENOUS | Status: AC
  Administered 2022-09-20: 10 mg via INTRAVENOUS
  Filled 2022-09-20: qty 10

## 2022-09-20 MED ORDER — SODIUM CHLORIDE 0.9 % IV SOLN
150.0000 mg | Freq: Once | INTRAVENOUS | Status: AC
  Administered 2022-09-20: 150 mg via INTRAVENOUS
  Filled 2022-09-20: qty 150

## 2022-09-20 MED ORDER — MAGNESIUM SULFATE 2 GM/50ML IV SOLN
2.0000 g | Freq: Once | INTRAVENOUS | Status: AC
  Administered 2022-09-20: 2 g via INTRAVENOUS
  Filled 2022-09-20: qty 50

## 2022-09-20 MED ORDER — POTASSIUM CHLORIDE IN NACL 20-0.9 MEQ/L-% IV SOLN
Freq: Once | INTRAVENOUS | Status: AC
  Filled 2022-09-20: qty 1000

## 2022-09-20 MED ORDER — PALONOSETRON HCL INJECTION 0.25 MG/5ML
0.2500 mg | Freq: Once | INTRAVENOUS | Status: AC
  Administered 2022-09-20: 0.25 mg via INTRAVENOUS
  Filled 2022-09-20: qty 5

## 2022-09-20 MED ORDER — SODIUM CHLORIDE 0.9 % IV SOLN: Freq: Once | INTRAVENOUS | Status: AC

## 2022-09-20 MED ORDER — HEPARIN SOD (PORK) LOCK FLUSH 100 UNIT/ML IV SOLN
500.0000 [IU] | Freq: Once | INTRAVENOUS | Status: AC | PRN
  Administered 2022-09-20: 500 [IU]

## 2022-09-20 MED ORDER — SODIUM CHLORIDE 0.9% FLUSH
10.0000 mL | INTRAVENOUS | Status: DC | PRN
  Administered 2022-09-20: 10 mL

## 2022-09-20 NOTE — Progress Notes (Signed)
Magnesium sulfate 2gm IVPB ordered per MD request. Ok'd by infusion charge RN to give alongside with post hydration fluids.

## 2022-09-20 NOTE — Progress Notes (Signed)
[facility] Cancer Follow up:    [name], NP [address] [place] [address]   DIAGNOSIS:  Cancer Staging  Secondary malignant neoplasm of cervical lymph node (HCC) Staging form: Pharynx - HPV-Mediated Oropharynx, AJCC 8th Edition - Clinical stage from 08/10/2022: Stage I (cT0, cN1, cM0, p16+) - Signed by Lonie Peak, MD on 08/11/2022 Stage prefix: Initial diagnosis   SUMMARY OF ONCOLOGIC HISTORY: Oncology History  Secondary malignant neoplasm of cervical lymph node (HCC)  06/26/2022 Imaging   Neck CT: a 4.4 cm x 3.0 cm x 4.1 cm soft tissue mass centered in the right parapharyngeal space, favored to be arising from the deep lobe of the right parotid gland, 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 The mass appeared to encase a portion of the right external carotid artery and partially encases the right internal carotid artery Prominent right-sided cervical and upper mediastinal  lymph nodes were also appreciated, suspicious for metastatic spread of  disease    07/24/2022 Imaging   MRI of the face and neck  showed the infiltrative mass lesion centered in the right carotid, parapharyngeal and parotid spaces with extensive surrounding local invasion, and findings concerning for metastatic adenopathy in the right neck and additional nonspecific mildly enlarged and rounded lymph nodes involving the upper mediastinum and contralateral left jugular chain. MRI also showed evidence of tumor extension within close proximity to multiple skill base foramina, along with evidence of right hypoglossal nerve involvement contributing to right hemitongue denervation changes, and tumor thrombus within the encased right internal jugular vein. MRI otherwise showed no evidence of intracranial perineural tumor spread.    07/27/2022 Initial Biopsy   FNA of the right neck mass showed carcinoma with squamous features, favoring squamous cell carcinoma (however,  other tumors with squamous differentiation cannot entirely be excluded); P16 diffusely positive; HPV focally positive.    08/02/2022 PET scan   Whole body PET scan: the dominant right level IIa metastatic nodal mass measuring 4.9 x 2.5 cm with an SUV max of 15; multiple ipsilateral nodal metastases involving the right level 3, level 4, and level 5 lymph node stations including the dominant right level 3 nodal metastasis (measuring 1.1 cm) coming in contact with the right oropharynx without discrete primary tumor; FDG avidity within the left palatine tonsil (normal in size and with a max SUV of 4.4); and mildly FDG avid mediastinal, bihilar, and subcarinal lymph nodes. No other sites concerning for metastatic disease were appreciated elsewhere in the body.    08/10/2022 Cancer Staging   Staging form: Pharynx - HPV-Mediated Oropharynx, AJCC 8th Edition - Clinical stage from 08/10/2022: Stage I (cT0, cN1, cM0, p16+) - Signed by Lonie Peak, MD on 08/11/2022 Stage prefix: Initial diagnosis   08/21/2022 Procedure   Port placement and G-tube placement   08/23/2022 - 10/08/2022 Radiation Therapy   Concurrent radiation   08/24/2022 -  Chemotherapy   Patient is on Treatment Plan : HEAD/NECK Cisplatin (40) q7d       CURRENT THERAPY: concurrent chemoradiation  INTERVAL HISTORY: Nichole Dominguez 67 y.o. female returns for f/u prior to receiving chemotherapy with Cisplatin. She tells me she is tolerating treatment well.  She denies any issues. No nausea, vomiting, diarrhea. She is able to eat and swallow, everything tastes like saw dust. She is using G tube for feeding. She doesn't like using the G tube. She is only using 2 cartons a day, she says every time we use the G tube, she  is nauseated and has diarrhea.  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  MEDICAL HISTORY: Past Medical History:  Diagnosis Date   Allergy    GERD (gastroesophageal reflux disease)    Osteopenia 01/04/2020   Plantar fasciitis    Seasonal allergies     SURGICAL HISTORY: Past Surgical History:  Procedure Laterality Date   COLONOSCOPY  2011   IR GASTROSTOMY TUBE MOD SED  08/21/2022   IR IMAGING GUIDED PORT INSERTION  08/21/2022   IR PATIENT EVAL TECH 0-60 MINS  08/31/2022   IR PATIENT EVAL TECH 0-60 MINS  09/07/2022   MOUTH SURGERY  01/2020   implant inserted   TUBAL LIGATION  1992    SOCIAL HISTORY: Social History   Socioeconomic History   Marital status: Married    Spouse name: Not on file   Number of children: Not on file   Years of education: Not on file   Highest education level: Not on file  Occupational History   Not on file  Tobacco Use   Smoking status: Former    Packs/day: 1.00    Years: 24.00    Additional pack years: 0.00    Total pack years: 24.00    Types: Cigarettes    Quit date: 10/10/1996    Years since quitting: 25.9   Smokeless tobacco: Never  Vaping Use   Vaping Use: Never used  Substance and Sexual Activity   Alcohol use: Yes    Alcohol/week: 0.0 standard drinks of alcohol    Comment: 1 shot vodka daily   Drug use: No   Sexual activity: Yes    Partners: Male  Other Topics Concern   Not on file  Social History Narrative   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    Lives with husband    1 daughter in [place], 54 grandchildren   Son died   2 grown stepchildren- both Designer, multimedia, grandchildren   Enjoys Corporate investment banker   Social Determinants of Corporate investment banker Strain: Not on file  Food Insecurity: No Food Insecurity (08/09/2022)   Hunger Vital Sign    Worried About Running Out of Food in the Last Year: Never true    Ran Out of Food in the Last Year: Never true  Transportation Needs: No Transportation Needs (08/09/2022)   PRAPARE - Administrator, Civil Service (Medical): No    Lack of Transportation (Non-Medical): No  Physical Activity: Not on file  Stress: Not on file  Social Connections: Not on file  Intimate Partner Violence: Not At Risk (08/09/2022)   Humiliation, Afraid, Rape, and Kick questionnaire    Fear of Current or Ex-Partner: No    Emotionally Abused: No    Physically Abused: No    Sexually Abused: No  FAMILY HISTORY: Family History  Problem Relation Age of Onset   Diabetes Mother    Heart disease Mother        chf   Hypertension Mother    Cervical cancer Mother    Obesity Mother    Colon polyps Mother    Thyroid cancer Father    Cancer Father        thyroid x2   Heart attack Father    Breast cancer Sister    Lupus Sister    Obesity Maternal Grandmother    Diabetes Maternal Grandmother    Heart disease Maternal Grandmother    Hypertension Maternal Grandmother    Stroke Maternal Grandmother    Diabetes Mellitus II Paternal Grandmother        died from diabetic coma   Cancer Paternal Grandfather        not sure what type   Heart attack Son    Esophageal cancer Neg Hx    Stomach cancer Neg Hx    Rectal cancer Neg Hx    Colon cancer Neg Hx     Review of Systems  Constitutional:  Negative for appetite change, chills, fatigue, fever and unexpected weight change.  HENT:   Negative for hearing loss, lump/mass and trouble swallowing.   Eyes:  Negative for eye problems and icterus.  Respiratory:  Negative for chest tightness,  cough and shortness of breath.   Cardiovascular:  Negative for chest pain, leg swelling and palpitations.  Gastrointestinal:  Negative for abdominal distention, abdominal pain, constipation, diarrhea, nausea and vomiting.  Endocrine: Negative for hot flashes.  Genitourinary:  Negative for difficulty urinating.   Musculoskeletal:  Negative for arthralgias.  Skin:  Negative for itching and rash.  Neurological:  Negative for dizziness, extremity weakness, headaches and numbness.  Hematological:  Negative for adenopathy. Does not bruise/bleed easily.  Psychiatric/Behavioral:  Negative for depression. The patient is not nervous/anxious.       PHYSICAL EXAMINATION     Vitals:   09/20/22 0844  BP: 137/69  Pulse: 83  Resp: 18  Temp: 97.9 F (36.6 C)  SpO2: 98%    Physical Exam Constitutional:      General: She is not in acute distress.    Appearance: Normal appearance. She is not toxic-appearing.  HENT:     Head: Normocephalic and atraumatic.     Mouth/Throat:     Mouth: Mucous membranes are moist.     Pharynx: Oropharynx is clear. No oropharyngeal exudate or posterior oropharyngeal erythema.  Eyes:     General: No scleral icterus. Cardiovascular:     Rate and Rhythm: Normal rate and regular rhythm.     Pulses: Normal pulses.     Heart sounds: Normal heart sounds.  Pulmonary:     Effort: Pulmonary effort is normal.     Breath sounds: Normal breath sounds.  Abdominal:     General: Abdomen is flat. Bowel sounds are normal. There is no distension.     Palpations: Abdomen is soft.     Tenderness: There is no abdominal tenderness.  Musculoskeletal:        General: No swelling.     Cervical back: Neck supple.  Lymphadenopathy:     Cervical: No cervical adenopathy.  Skin:    General: Skin is warm and dry.     Findings: No rash.  Neurological:     General: No focal deficit present.     Mental Status: She is alert.  Psychiatric:  Mood and Affect: Mood normal.         Behavior: Behavior normal.     LABORATORY DATA:  CBC    Component Value Date/Time   WBC 4.0 09/20/2022 0818   WBC 7.7 08/21/2022 1000   RBC 3.49 (L) 09/20/2022 0818   HGB 11.2 (L) 09/20/2022 0818   HCT 31.6 (L) 09/20/2022 0818   PLT 162 09/20/2022 0818   MCV 90.5 09/20/2022 0818   MCH 32.1 09/20/2022 0818   MCHC 35.4 09/20/2022 0818   RDW 11.3 (L) 09/20/2022 0818   LYMPHSABS 0.4 (L) 09/20/2022 0818   MONOABS 0.5 09/20/2022 0818   EOSABS 0.0 09/20/2022 0818   BASOSABS 0.0 09/20/2022 0818    CMP     Component Value Date/Time   NA 134 (L) 09/20/2022 0818   K 4.1 09/20/2022 0818   CL 98 09/20/2022 0818   CO2 27 09/20/2022 0818   GLUCOSE 125 (H) 09/20/2022 0818   BUN 19 09/20/2022 0818   CREATININE 0.92 09/20/2022 0818   CREATININE 1.02 (H) 12/28/2019 0749   CALCIUM 9.2 09/20/2022 0818   PROT 7.3 12/28/2019 0749   ALBUMIN 4.2 12/24/2018 0942   AST 18 12/28/2019 0749   ALT 49 (H) 12/28/2019 0749   ALKPHOS 124 (H) 12/24/2018 0942   BILITOT 0.7 12/28/2019 0749   GFRNONAA >60 09/20/2022 0818       ASSESSMENT and THERAPY PLAN:   Secondary malignant neoplasm of cervical lymph node (HCC) Nichole Dominguez is a 67 year old woman with stage I squamous cell carcinoma here today for follow-up and evaluation prior to receiving concurrent chemoradiation.  She is tolerating treatment well and has had no significant side effects.   She will continue with her treatment today.  Her magnesium is slightly decreased and we will give her additional magnesium today.  She will return daily for radiation and in 1 week for her her continued concurrent chemotherapy.  No other side effects reported.    All questions were answered. The patient knows to call the clinic with any problems, questions or concerns. We can certainly see the patient much sooner if necessary.  Total encounter time:20 minutes*in face-to-face visit time, chart review, lab review, care coordination, order entry, and  documentation of the encounter time.    *Total Encounter Time as defined by the Centers for Medicare and Medicaid Services includes, in addition to the face-to-face time of a patient visit (documented in the note above) non-face-to-face time: obtaining and reviewing outside history, ordering and reviewing medications, tests or procedures, care coordination (communications with other health care professionals or caregivers) and documentation in the medical record.

## 2022-09-20 NOTE — Patient Instructions (Addendum)
[facility]  Discharge Instructions: Thank you for choosing [facility] to provide your oncology and hematology care.   If you have a lab appointment with the Cancer Center, please go directly to the Cancer Center and check in at the registration area.   Wear comfortable clothing and clothing appropriate for easy access to any Portacath or PICC line.   We strive to give you quality time with your provider. You may need to reschedule your appointment if you arrive late (15 or more minutes).  Arriving late affects you and other patients whose appointments are after yours.  Also, if you miss three or more appointments without notifying the office, you may be dismissed from the clinic at the provider's discretion.      For prescription refill requests, have your pharmacy contact our office and allow 72 hours for refills to be completed.    Today you received the following chemotherapy and/or immunotherapy agent: Cisplatin   To help prevent nausea and vomiting after your treatment, we encourage you to take your nausea medication as directed.  BELOW ARE SYMPTOMS THAT SHOULD BE REPORTED IMMEDIATELY: *FEVER GREATER THAN 100.4 F (38 C) OR HIGHER *CHILLS OR SWEATING *NAUSEA AND VOMITING THAT IS NOT CONTROLLED WITH YOUR NAUSEA MEDICATION *UNUSUAL SHORTNESS OF BREATH *UNUSUAL BRUISING OR BLEEDING *URINARY PROBLEMS (pain or burning when urinating, or frequent urination) *BOWEL PROBLEMS (unusual diarrhea, constipation, pain near the anus) TENDERNESS IN MOUTH AND THROAT WITH OR WITHOUT PRESENCE OF ULCERS (sore throat, sores in mouth, or a toothache) UNUSUAL RASH, SWELLING OR PAIN  UNUSUAL VAGINAL DISCHARGE OR ITCHING   Items with * indicate a potential emergency and should be followed up as soon as possible or go to the Emergency Department if any problems should occur.  Please show the CHEMOTHERAPY ALERT CARD or IMMUNOTHERAPY ALERT CARD at check-in  to the Emergency Department and triage nurse.  Should you have questions after your visit or need to cancel or reschedule your appointment, please contact [facility]  Dept: [phone]  and follow the prompts.  Office hours are 8:00 a.m. to 4:30 p.m. Monday - Friday. Please note that voicemails left after 4:00 p.m. may not be returned until the following business day.  We are closed weekends and major holidays. You have access to a nurse at all times for urgent questions. Please call the main number to the clinic Dept: [phone] and follow the prompts.   For any non-urgent questions, you may also contact your provider using MyChart. We now offer e-Visits for anyone 45 and older to request care online for non-urgent symptoms. For details visit [email or web address].   Also download the MyChart app! Go to the app store, search "MyChart", open the app, select [facility], and log in with your MyChart username and password.  Hypomagnesemia Hypomagnesemia is a condition in which the level of magnesium in the blood is too low. Magnesium is a mineral that is found in many foods. It is used in many different processes in the body. Hypomagnesemia can affect every organ in the body. In severe cases, it can cause life-threatening problems. What are the causes? This condition may be caused by: Not getting enough magnesium in your diet or not having enough healthy foods to eat (malnutrition). Problems with magnesium absorption in the intestines. Dehydration. Excessive use of alcohol. Vomiting. Severe or long-term (chronic) diarrhea. Some medicines, including medicines that make you urinate more often (diuretics). Certain  diseases, such as kidney disease, diabetes, celiac disease, and overactive thyroid. What are the signs or symptoms? Symptoms of this condition include: Loss of appetite, nausea, and vomiting. Involuntary shaking or trembling of a body part  (tremor). Muscle weakness or tingling in the arms and legs. Sudden tightening of muscles (muscle spasms). Confusion. Psychiatric issues, such as: Depression and irritability. Psychosis. A feeling of fluttering of the heart (palpitations). Seizures. These symptoms are more severe if magnesium levels drop suddenly. How is this diagnosed? This condition may be diagnosed based on: Your symptoms and medical history. A physical exam. Blood and urine tests. How is this treated? Treatment depends on the cause and the severity of the condition. It may be treated by: Taking a magnesium supplement. This can be taken in pill form. If the condition is severe, magnesium is usually given through an IV. Making changes to your diet. You may be directed to eat foods that have a lot of magnesium, such as green leafy vegetables, peas, beans, and nuts. Not drinking alcohol. If you are struggling not to drink, ask your health care provider for help. Follow these instructions at home: Eating and drinking     Make sure that your diet includes foods with magnesium. Foods that have a lot of magnesium in them include: Green leafy vegetables, such as spinach and broccoli. Beans and peas. Nuts and seeds, such as almonds and sunflower seeds. Whole grains, such as whole grain bread and fortified cereals. Drink fluids that contain salts and minerals (electrolytes), such as sports drinks, when you are active. Do not drink alcohol. General instructions Take over-the-counter and prescription medicines only as told by your health care provider. Take magnesium supplements as directed if your health care provider tells you to take them. Have your magnesium levels monitored as told by your health care provider. Keep all follow-up visits. This is important. Contact a health care provider if: You get worse instead of better. Your symptoms return. Get help right away if: You develop severe muscle weakness. You have  trouble breathing. You feel that your heart is racing. These symptoms may represent a serious problem that is an emergency. Do not wait to see if the symptoms will go away. Get medical help right away. Call your local emergency services (911 in the [place] Do not drive yourself to the hospital. Summary Hypomagnesemia is a condition in which the level of magnesium in the blood is too low. Hypomagnesemia can affect every organ in the body. Treatment may include eating more foods that contain magnesium, taking magnesium supplements, and not drinking alcohol. Have your magnesium levels monitored as told by your health care provider. This information is not intended to replace advice given to you by your health care provider. Make sure you discuss any questions you have with your health care provider. Document Revised: 08/23/2020 Document Reviewed: 08/23/2020 Elsevier Patient Education  2024 [employer].

## 2022-09-20 NOTE — Assessment & Plan Note (Signed)
Nichole Dominguez is a 67 year old woman with stage I squamous cell carcinoma here today for follow-up and evaluation prior to receiving concurrent chemoradiation.  She is tolerating treatment well and has had no significant side effects.   She will continue with her treatment today.  Her magnesium is slightly decreased and we will give her additional magnesium today.  She will return daily for radiation and in 1 week for her her continued concurrent chemotherapy.  No other side effects reported.

## 2022-09-20 NOTE — Progress Notes (Signed)
Nutrition Follow-up:  Patient with supraglottic cancer. She is receiving concurrent chemoradiation with weekly cisplatin.   Met with patient in infusion. She reports unable to tolerate Osmolite formula. Patient endorses nausea with regurgitation, followed by diarrhea. She has been giving 2 cartons daily. Patient reports feeling the water flush in her throat. This does not cause nausea or vomiting, but "its a weird feeling." Patient is trying to eat orally. Food turns to sawdust in her mouth. She is tolerating CIB with fairlife milk okay. Recalls CIB, tea, 2 bites of BBQ, and one carton of Osmolite yesterday. Patient tried chicken noodle soup in infusion. This smelled delicious, however after 2 spoonfuls pt had episode of vomiting. Patient agreeable to try [facility] formula. RD assisted pt with bolus. Patient tolerated water flush and ~one third of carton before reports of nausea and gagging. RD assisted pt to stand which reduced feeling of formula regurgitation. Patient reports history of "stomach issues" She is taking prilosec each morning plus Rolaids as needed.  Medications: reviewed   Labs: Na 134, Mg 1.2  Anthropometrics: Wt 146 lb 1.6 oz today decreased 6% (9lbs) in 3 weeks - this is severe  6/6 - 148 lb 14.4 oz 5/29 - 151 lb 4.8 oz  5/23 - 155 lb 8 oz    Estimated Energy Needs  Kcals: 2115-2400 Protein: 92-106 Fluid: > 2.1 L  NUTRITION DIAGNOSIS: Predicted suboptimal intake continues    INTERVENTION:  Pt agreeable to try finishing carton of [employer] 1.4 at home. If tolerating she will try giving one half carton QID with 60 ml water flush before and after Continue drinking CIB with fairlife milk - recommend increasing 4-5 times daily if unable to tolerate bolus Suspect motility issues, consider trial of reglan - will discuss with MD    MONITORING, EVALUATION, GOAL: weight trends, intake, TF   NEXT VISIT: Wednesday June 19 during infusion

## 2022-09-21 ENCOUNTER — Telehealth: Payer: Self-pay | Admitting: Dietician

## 2022-09-21 ENCOUNTER — Other Ambulatory Visit: Payer: Self-pay

## 2022-09-21 ENCOUNTER — Ambulatory Visit
Admission: RE | Admit: 2022-09-21 | Discharge: 2022-09-21 | Disposition: A | Discharge status: Home / Self Care | Payer: 59 | Source: Ambulatory Visit | Attending: Radiation Oncology | Admitting: Radiation Oncology

## 2022-09-21 DIAGNOSIS — E860 Dehydration: Secondary | ICD-10-CM | POA: Diagnosis not present

## 2022-09-21 DIAGNOSIS — Z79899 Other long term (current) drug therapy: Secondary | ICD-10-CM | POA: Diagnosis not present

## 2022-09-21 DIAGNOSIS — Z510 Encounter for antineoplastic radiation therapy: Secondary | ICD-10-CM | POA: Diagnosis not present

## 2022-09-21 DIAGNOSIS — Z5111 Encounter for antineoplastic chemotherapy: Secondary | ICD-10-CM | POA: Diagnosis not present

## 2022-09-21 DIAGNOSIS — C140 Malignant neoplasm of pharynx, unspecified: Secondary | ICD-10-CM | POA: Diagnosis not present

## 2022-09-21 DIAGNOSIS — R1310 Dysphagia, unspecified: Secondary | ICD-10-CM | POA: Diagnosis not present

## 2022-09-21 DIAGNOSIS — C770 Secondary and unspecified malignant neoplasm of lymph nodes of head, face and neck: Secondary | ICD-10-CM | POA: Diagnosis not present

## 2022-09-21 DIAGNOSIS — D696 Thrombocytopenia, unspecified: Secondary | ICD-10-CM | POA: Diagnosis not present

## 2022-09-21 LAB — RAD ONC ARIA SESSION SUMMARY
Course Elapsed Days: 32
Plan Fractions Treated to Date: 24
Plan Prescribed Dose Per Fraction: 2 Gy
Plan Total Fractions Prescribed: 35
Plan Total Prescribed Dose: 70 Gy
Reference Point Dosage Given to Date: 48 Gy
Reference Point Session Dosage Given: 2 Gy
Session Number: 24

## 2022-09-21 NOTE — Telephone Encounter (Signed)
Brief Nutrition Follow-up:  RD placed call to patient to assess tolerance to [employer] formula. Patient reports having nausea as well as episode of diarrhea around 4PM yesterday. She did not eat orally or try the other half of [facility].   This morning she took omeprazole and had a cup of tangerine tea. This is the only thing that does not taste like saw dust or soap. Patient took antinausea medication, waited 30 minutes and drank CIB. This gave her heartburn. Patient took rolaids which was helpful. Patient planning to bolus the other half of [facility] after her cake comes out of the oven. Patient states she will not be able to tolerate additional intake after that secondary to nausea and reflux.   Patient agreeable to increase CIB. She will try to drink 2 more of these today as tolerated. Patient will flush tube with water. She endorses feeling water in her throat when flushing tube. Question tube position. Consider imaging to confirm correct placement.   Next Appointment: Wednesday June 19 during infusion

## 2022-09-24 ENCOUNTER — Ambulatory Visit
Admission: RE | Admit: 2022-09-24 | Discharge: 2022-09-24 | Disposition: A | Discharge status: Home / Self Care | Payer: 59 | Source: Ambulatory Visit | Attending: Radiation Oncology | Admitting: Radiation Oncology

## 2022-09-24 ENCOUNTER — Other Ambulatory Visit: Payer: Self-pay

## 2022-09-24 DIAGNOSIS — E860 Dehydration: Secondary | ICD-10-CM | POA: Diagnosis not present

## 2022-09-24 DIAGNOSIS — R1310 Dysphagia, unspecified: Secondary | ICD-10-CM | POA: Diagnosis not present

## 2022-09-24 DIAGNOSIS — D696 Thrombocytopenia, unspecified: Secondary | ICD-10-CM | POA: Diagnosis not present

## 2022-09-24 DIAGNOSIS — C760 Malignant neoplasm of head, face and neck: Secondary | ICD-10-CM

## 2022-09-24 DIAGNOSIS — C140 Malignant neoplasm of pharynx, unspecified: Secondary | ICD-10-CM | POA: Diagnosis not present

## 2022-09-24 DIAGNOSIS — C770 Secondary and unspecified malignant neoplasm of lymph nodes of head, face and neck: Secondary | ICD-10-CM | POA: Diagnosis not present

## 2022-09-24 DIAGNOSIS — Z79899 Other long term (current) drug therapy: Secondary | ICD-10-CM | POA: Diagnosis not present

## 2022-09-24 DIAGNOSIS — Z510 Encounter for antineoplastic radiation therapy: Secondary | ICD-10-CM | POA: Diagnosis not present

## 2022-09-24 DIAGNOSIS — Z5111 Encounter for antineoplastic chemotherapy: Secondary | ICD-10-CM | POA: Diagnosis not present

## 2022-09-24 LAB — RAD ONC ARIA SESSION SUMMARY
Course Elapsed Days: 35
Plan Fractions Treated to Date: 25
Plan Prescribed Dose Per Fraction: 2 Gy
Plan Total Fractions Prescribed: 35
Plan Total Prescribed Dose: 70 Gy
Reference Point Dosage Given to Date: 50 Gy
Reference Point Session Dosage Given: 2 Gy
Session Number: 25

## 2022-09-25 ENCOUNTER — Other Ambulatory Visit: Payer: Self-pay

## 2022-09-25 ENCOUNTER — Ambulatory Visit (HOSPITAL_COMMUNITY)
Admission: RE | Admit: 2022-09-25 | Discharge: 2022-09-25 | Disposition: A | Discharge status: Home / Self Care | Payer: 59 | Source: Ambulatory Visit | Attending: Radiation Oncology | Admitting: Radiation Oncology

## 2022-09-25 ENCOUNTER — Ambulatory Visit
Admission: RE | Admit: 2022-09-25 | Discharge: 2022-09-25 | Disposition: A | Discharge status: Home / Self Care | Payer: 59 | Source: Ambulatory Visit | Attending: Radiation Oncology | Admitting: Radiation Oncology

## 2022-09-25 DIAGNOSIS — E860 Dehydration: Secondary | ICD-10-CM | POA: Diagnosis not present

## 2022-09-25 DIAGNOSIS — C760 Malignant neoplasm of head, face and neck: Secondary | ICD-10-CM | POA: Insufficient documentation

## 2022-09-25 DIAGNOSIS — D696 Thrombocytopenia, unspecified: Secondary | ICD-10-CM | POA: Diagnosis not present

## 2022-09-25 DIAGNOSIS — Z5111 Encounter for antineoplastic chemotherapy: Secondary | ICD-10-CM | POA: Diagnosis not present

## 2022-09-25 DIAGNOSIS — R1310 Dysphagia, unspecified: Secondary | ICD-10-CM | POA: Diagnosis not present

## 2022-09-25 DIAGNOSIS — Z79899 Other long term (current) drug therapy: Secondary | ICD-10-CM | POA: Diagnosis not present

## 2022-09-25 DIAGNOSIS — C770 Secondary and unspecified malignant neoplasm of lymph nodes of head, face and neck: Secondary | ICD-10-CM | POA: Diagnosis not present

## 2022-09-25 DIAGNOSIS — C140 Malignant neoplasm of pharynx, unspecified: Secondary | ICD-10-CM | POA: Diagnosis not present

## 2022-09-25 DIAGNOSIS — Z510 Encounter for antineoplastic radiation therapy: Secondary | ICD-10-CM | POA: Diagnosis not present

## 2022-09-25 DIAGNOSIS — Z4659 Encounter for fitting and adjustment of other gastrointestinal appliance and device: Secondary | ICD-10-CM | POA: Diagnosis not present

## 2022-09-25 LAB — RAD ONC ARIA SESSION SUMMARY
Course Elapsed Days: 36
Plan Fractions Treated to Date: 26
Plan Prescribed Dose Per Fraction: 2 Gy
Plan Total Fractions Prescribed: 35
Plan Total Prescribed Dose: 70 Gy
Reference Point Dosage Given to Date: 52 Gy
Reference Point Session Dosage Given: 2 Gy
Session Number: 26

## 2022-09-25 MED FILL — Dexamethasone Sodium Phosphate Inj 100 MG/10ML: INTRAMUSCULAR | Qty: 1 | Status: AC

## 2022-09-25 MED FILL — Fosaprepitant Dimeglumine For IV Infusion 150 MG (Base Eq): INTRAVENOUS | Qty: 5 | Status: AC

## 2022-09-26 ENCOUNTER — Other Ambulatory Visit: Payer: Self-pay

## 2022-09-26 ENCOUNTER — Inpatient Hospital Stay: Payer: 59

## 2022-09-26 ENCOUNTER — Encounter: Payer: Self-pay | Admitting: Adult Health

## 2022-09-26 ENCOUNTER — Ambulatory Visit
Admission: RE | Admit: 2022-09-26 | Discharge: 2022-09-26 | Disposition: A | Discharge status: Home / Self Care | Payer: 59 | Source: Ambulatory Visit | Attending: Radiation Oncology | Admitting: Radiation Oncology

## 2022-09-26 ENCOUNTER — Inpatient Hospital Stay: Payer: 59 | Admitting: Dietician

## 2022-09-26 ENCOUNTER — Inpatient Hospital Stay: Payer: 59 | Admitting: Adult Health

## 2022-09-26 VITALS — BP 145/86 | HR 85

## 2022-09-26 DIAGNOSIS — Z79899 Other long term (current) drug therapy: Secondary | ICD-10-CM | POA: Diagnosis not present

## 2022-09-26 DIAGNOSIS — C770 Secondary and unspecified malignant neoplasm of lymph nodes of head, face and neck: Secondary | ICD-10-CM

## 2022-09-26 DIAGNOSIS — Z510 Encounter for antineoplastic radiation therapy: Secondary | ICD-10-CM | POA: Diagnosis not present

## 2022-09-26 DIAGNOSIS — C140 Malignant neoplasm of pharynx, unspecified: Secondary | ICD-10-CM | POA: Diagnosis not present

## 2022-09-26 DIAGNOSIS — E860 Dehydration: Secondary | ICD-10-CM | POA: Diagnosis not present

## 2022-09-26 DIAGNOSIS — D696 Thrombocytopenia, unspecified: Secondary | ICD-10-CM | POA: Diagnosis not present

## 2022-09-26 DIAGNOSIS — Z95828 Presence of other vascular implants and grafts: Secondary | ICD-10-CM

## 2022-09-26 DIAGNOSIS — R1310 Dysphagia, unspecified: Secondary | ICD-10-CM | POA: Diagnosis not present

## 2022-09-26 DIAGNOSIS — Z5111 Encounter for antineoplastic chemotherapy: Secondary | ICD-10-CM | POA: Diagnosis not present

## 2022-09-26 LAB — RAD ONC ARIA SESSION SUMMARY
Course Elapsed Days: 37
Plan Fractions Treated to Date: 27
Plan Prescribed Dose Per Fraction: 2 Gy
Plan Total Fractions Prescribed: 35
Plan Total Prescribed Dose: 70 Gy
Reference Point Dosage Given to Date: 54 Gy
Reference Point Session Dosage Given: 2 Gy
Session Number: 27

## 2022-09-26 LAB — CBC WITH DIFFERENTIAL (CANCER CENTER ONLY)
Abs Immature Granulocytes: 0.03 10*3/uL (ref 0.00–0.07)
Basophils Absolute: 0 10*3/uL (ref 0.0–0.1)
Basophils Relative: 0 %
Eosinophils Absolute: 0 10*3/uL (ref 0.0–0.5)
Eosinophils Relative: 0 %
HCT: 29.9 % — ABNORMAL LOW (ref 36.0–46.0)
Hemoglobin: 10.8 g/dL — ABNORMAL LOW (ref 12.0–15.0)
Immature Granulocytes: 1 %
Lymphocytes Relative: 15 %
Lymphs Abs: 0.4 10*3/uL — ABNORMAL LOW (ref 0.7–4.0)
MCH: 32.3 pg (ref 26.0–34.0)
MCHC: 36.1 g/dL — ABNORMAL HIGH (ref 30.0–36.0)
MCV: 89.5 fL (ref 80.0–100.0)
Monocytes Absolute: 0.4 10*3/uL (ref 0.1–1.0)
Monocytes Relative: 14 %
Neutro Abs: 1.9 10*3/uL (ref 1.7–7.7)
Neutrophils Relative %: 70 %
Platelet Count: 137 10*3/uL — ABNORMAL LOW (ref 150–400)
RBC: 3.34 MIL/uL — ABNORMAL LOW (ref 3.87–5.11)
RDW: 11.3 % — ABNORMAL LOW (ref 11.5–15.5)
WBC Count: 2.7 10*3/uL — ABNORMAL LOW (ref 4.0–10.5)
nRBC: 0 % (ref 0.0–0.2)

## 2022-09-26 LAB — BASIC METABOLIC PANEL - CANCER CENTER ONLY
Anion gap: 10 (ref 5–15)
BUN: 23 mg/dL (ref 8–23)
CO2: 24 mmol/L (ref 22–32)
Calcium: 8.7 mg/dL — ABNORMAL LOW (ref 8.9–10.3)
Chloride: 100 mmol/L (ref 98–111)
Creatinine: 0.95 mg/dL (ref 0.44–1.00)
GFR, Estimated: >60 mL/min (ref >60)
Glucose, Bld: 110 mg/dL — ABNORMAL HIGH (ref 70–99)
Potassium: 3.7 mmol/L (ref 3.5–5.1)
Sodium: 134 mmol/L — ABNORMAL LOW (ref 135–145)

## 2022-09-26 LAB — MAGNESIUM: Magnesium: 1.4 mg/dL — ABNORMAL LOW (ref 1.7–2.4)

## 2022-09-26 MED ORDER — MAGNESIUM SULFATE 2 GM/50ML IV SOLN: 2.0000 g | Freq: Once | INTRAVENOUS | Status: DC

## 2022-09-26 MED ORDER — SODIUM CHLORIDE 0.9 % IV SOLN
10.0000 mg | Freq: Once | INTRAVENOUS | Status: DC
  Filled 2022-09-26: qty 1

## 2022-09-26 MED ORDER — SODIUM CHLORIDE 0.9 % IV SOLN
10.0000 mg | Freq: Once | INTRAVENOUS | Status: AC
  Administered 2022-09-26: 10 mg via INTRAVENOUS
  Filled 2022-09-26: qty 10

## 2022-09-26 MED ORDER — MAGNESIUM SULFATE 2 GM/50ML IV SOLN
2.0000 g | Freq: Once | INTRAVENOUS | Status: AC
  Administered 2022-09-26: 2 g via INTRAVENOUS
  Filled 2022-09-26: qty 50

## 2022-09-26 MED ORDER — SCOPOLAMINE 1 MG/3DAYS TD PT72: 1.0000 | MEDICATED_PATCH | TRANSDERMAL | 12 refills | Status: DC

## 2022-09-26 MED ORDER — SODIUM CHLORIDE 0.9 % IV SOLN
150.0000 mg | Freq: Once | INTRAVENOUS | Status: DC
  Filled 2022-09-26: qty 5

## 2022-09-26 MED ORDER — HEPARIN SOD (PORK) LOCK FLUSH 100 UNIT/ML IV SOLN
500.0000 [IU] | Freq: Once | INTRAVENOUS | Status: AC | PRN
  Administered 2022-09-26: 500 [IU]

## 2022-09-26 MED ORDER — SODIUM CHLORIDE 0.9 % IV SOLN
150.0000 mg | Freq: Once | INTRAVENOUS | Status: AC
  Administered 2022-09-26: 150 mg via INTRAVENOUS
  Filled 2022-09-26: qty 150

## 2022-09-26 MED ORDER — PALONOSETRON HCL INJECTION 0.25 MG/5ML
0.2500 mg | Freq: Once | INTRAVENOUS | Status: AC
  Administered 2022-09-26: 0.25 mg via INTRAVENOUS
  Filled 2022-09-26: qty 5

## 2022-09-26 MED ORDER — SODIUM CHLORIDE 0.9 % IV SOLN
40.0000 mg/m2 | Freq: Once | INTRAVENOUS | Status: AC
  Administered 2022-09-26: 71 mg via INTRAVENOUS
  Filled 2022-09-26: qty 71

## 2022-09-26 MED ORDER — POTASSIUM CHLORIDE IN NACL 20-0.9 MEQ/L-% IV SOLN
Freq: Once | INTRAVENOUS | Status: AC
  Filled 2022-09-26: qty 1000

## 2022-09-26 MED ORDER — MAGNESIUM SULFATE 4 GM/100ML IV SOLN
4.0000 g | Freq: Once | INTRAVENOUS | Status: DC
  Filled 2022-09-26: qty 100

## 2022-09-26 MED ORDER — SODIUM CHLORIDE 0.9% FLUSH
10.0000 mL | INTRAVENOUS | Status: DC | PRN
  Administered 2022-09-26: 10 mL

## 2022-09-26 MED ORDER — SODIUM CHLORIDE 0.9% FLUSH
10.0000 mL | Freq: Once | INTRAVENOUS | Status: AC
  Administered 2022-09-26: 10 mL

## 2022-09-26 MED ORDER — SODIUM CHLORIDE 0.9 % IV SOLN: Freq: Once | INTRAVENOUS | Status: AC

## 2022-09-26 NOTE — Progress Notes (Signed)
Per Lillard Anes, PA, OK to treat with Mg 1.4.

## 2022-09-26 NOTE — Progress Notes (Signed)
[facility] Cancer Follow up:    Nichole Craze, NP [address] [place] [address]   DIAGNOSIS: Cancer Staging  Secondary malignant neoplasm of cervical lymph node (HCC) Staging form: Pharynx - HPV-Mediated Oropharynx, AJCC 8th Edition - Clinical stage from 08/10/2022: Stage I (cT0, cN1, cM0, p16+) - Signed by Lonie Peak, MD on 08/11/2022 Stage prefix: Initial diagnosis   SUMMARY OF ONCOLOGIC HISTORY: Oncology History  Secondary malignant neoplasm of cervical lymph node (HCC)  06/26/2022 Imaging   Neck CT: a 4.4 cm x 3.0 cm x 4.1 cm soft tissue mass centered in the right parapharyngeal space, favored to be arising from the deep lobe of the right parotid gland, 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 The mass appeared to encase a portion of the right external carotid artery and partially encases the right internal carotid artery Prominent right-sided cervical and upper mediastinal  lymph nodes were also appreciated, suspicious for metastatic spread of  disease    07/24/2022 Imaging   MRI of the face and neck  showed the infiltrative mass lesion centered in the right carotid, parapharyngeal and parotid spaces with extensive surrounding local invasion, and findings concerning for metastatic adenopathy in the right neck and additional nonspecific mildly enlarged and rounded lymph nodes involving the upper mediastinum and contralateral left jugular chain. MRI also showed evidence of tumor extension within close proximity to multiple skill base foramina, along with evidence of right hypoglossal nerve involvement contributing to right hemitongue denervation changes, and tumor thrombus within the encased right internal jugular vein. MRI otherwise showed no evidence of intracranial perineural tumor spread.    07/27/2022 Initial Biopsy   FNA of the right neck mass showed carcinoma with squamous features, favoring squamous cell carcinoma (however, other  tumors with squamous differentiation cannot entirely be excluded); P16 diffusely positive; HPV focally positive.    08/02/2022 PET scan   Whole body PET scan: the dominant right level IIa metastatic nodal mass measuring 4.9 x 2.5 cm with an SUV max of 15; multiple ipsilateral nodal metastases involving the right level 3, level 4, and level 5 lymph node stations including the dominant right level 3 nodal metastasis (measuring 1.1 cm) coming in contact with the right oropharynx without discrete primary tumor; FDG avidity within the left palatine tonsil (normal in size and with a max SUV of 4.4); and mildly FDG avid mediastinal, bihilar, and subcarinal lymph nodes. No other sites concerning for metastatic disease were appreciated elsewhere in the body.    08/10/2022 Cancer Staging   Staging form: Pharynx - HPV-Mediated Oropharynx, AJCC 8th Edition - Clinical stage from 08/10/2022: Stage I (cT0, cN1, cM0, p16+) - Signed by Lonie Peak, MD on 08/11/2022 Stage prefix: Initial diagnosis   08/21/2022 Procedure   Port placement and G-tube placement   08/23/2022 - 10/08/2022 Radiation Therapy   Concurrent radiation   08/24/2022 -  Chemotherapy   Patient is on Treatment Plan : HEAD/NECK Cisplatin (40) q7d       CURRENT THERAPY: concurrent chemoradiation  INTERVAL HISTORY: Nichole Dominguez 67 y.o. 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  MEDICAL  HISTORY: Past Medical History:  Diagnosis Date   Allergy    GERD (gastroesophageal reflux disease)    Osteopenia 01/04/2020   Plantar fasciitis    Seasonal allergies     SURGICAL HISTORY: Past Surgical History:  Procedure Laterality Date   COLONOSCOPY  2011   IR GASTROSTOMY TUBE MOD SED  08/21/2022   IR IMAGING GUIDED PORT INSERTION  08/21/2022   IR PATIENT EVAL TECH 0-60 MINS  08/31/2022   IR PATIENT EVAL TECH 0-60 MINS  09/07/2022   MOUTH SURGERY  01/2020   implant inserted   TUBAL LIGATION  1992    SOCIAL HISTORY: Social History   Socioeconomic History   Marital status: Married    Spouse name: Not on file   Number of children: Not on file   Years of education: Not on file   Highest education level: Not on file  Occupational History   Not on file  Tobacco Use   Smoking status: Former    Packs/day: 1.00    Years: 24.00    Additional pack years: 0.00    Total pack years: 24.00    Types: Cigarettes    Quit date: 10/10/1996    Years since quitting: 25.9   Smokeless tobacco: Never  Vaping Use   Vaping Use: Never used  Substance and Sexual Activity   Alcohol use: Yes    Alcohol/week: 0.0 standard drinks of alcohol    Comment: 1 shot vodka daily   Drug use: No   Sexual activity: Yes    Partners: Male  Other Topics Concern   Not on file  Social History Narrative   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    Lives with husband   1 daughter in [place], 40 grandchildren   Son died   2 grown stepchildren- both Designer, multimedia, grandchildren   Enjoys Corporate investment banker   Social Determinants of Corporate investment banker Strain: Not on file  Food Insecurity: No Food Insecurity (08/09/2022)   Hunger Vital Sign    Worried About Running Out of Food in the Last Year: Never true    Ran Out of Food in the Last Year: Never true  Transportation Needs: No Transportation Needs (08/09/2022)   PRAPARE - Administrator, Civil Service (Medical): No    Lack of  Transportation (Non-Medical): No  Physical Activity: Not on file  Stress: Not on file  Social Connections: Not on file  Intimate Partner Violence: Not At Risk (08/09/2022)   Humiliation, Afraid, Rape, and Kick questionnaire    Fear of Current or Ex-Partner: No    Emotionally Abused: No    Physically Abused: No    Sexually Abused: No    FAMILY HISTORY: Family History  Problem Relation Age of Onset   Diabetes Mother    Heart disease Mother        chf   Hypertension Mother    Cervical cancer Mother    Obesity Mother    Colon polyps Mother    Thyroid cancer Father    Cancer Father        thyroid x2   Heart attack Father    Breast cancer Sister    Lupus Sister    Obesity Maternal Grandmother    Diabetes Maternal Grandmother    Heart disease Maternal Grandmother  Hypertension Maternal Grandmother    Stroke Maternal Grandmother    Diabetes Mellitus II Paternal Grandmother        died from diabetic coma   Cancer Paternal Grandfather        not sure what type   Heart attack Son    Esophageal cancer Neg Hx    Stomach cancer Neg Hx    Rectal cancer Neg Hx    Colon cancer Neg Hx     Review of Systems - Oncology    PHYSICAL EXAMINATION   Onc Performance Status - 09/26/22 0832       ECOG Perf Status   ECOG Perf Status Restricted in physically strenuous activity but ambulatory and able to carry out work of a light or sedentary nature, e.g., light house work, office work      KPS SCALE   KPS % SCORE Able to carry on normal activity, minor s/s of disease             Vitals:   09/26/22 0822  BP: 136/78  Pulse: 100  Resp: 14  Temp: (!) 97.5 F (36.4 C)  SpO2: 96%    Physical Exam  LABORATORY DATA:  CBC    Component Value Date/Time   WBC 2.7 (L) 09/26/2022 0758   WBC 7.7 08/21/2022 1000   RBC 3.34 (L) 09/26/2022 0758   HGB 10.8 (L) 09/26/2022 0758   HCT 29.9 (L) 09/26/2022 0758   PLT 137 (L) 09/26/2022 0758   MCV 89.5 09/26/2022 0758   MCH 32.3  09/26/2022 0758   MCHC 36.1 (H) 09/26/2022 0758   RDW 11.3 (L) 09/26/2022 0758   LYMPHSABS 0.4 (L) 09/26/2022 0758   MONOABS 0.4 09/26/2022 0758   EOSABS 0.0 09/26/2022 0758   BASOSABS 0.0 09/26/2022 0758    CMP     Component Value Date/Time   NA 134 (L) 09/26/2022 0758   K 3.7 09/26/2022 0758   CL 100 09/26/2022 0758   CO2 24 09/26/2022 0758   GLUCOSE 110 (H) 09/26/2022 0758   BUN 23 09/26/2022 0758   CREATININE 0.95 09/26/2022 0758   CREATININE 1.02 (H) 12/28/2019 0749   CALCIUM 8.7 (L) 09/26/2022 0758   PROT 7.3 12/28/2019 0749   ALBUMIN 4.2 12/24/2018 0942   AST 18 12/28/2019 0749   ALT 49 (H) 12/28/2019 0749   ALKPHOS 124 (H) 12/24/2018 0942   BILITOT 0.7 12/28/2019 0749   GFRNONAA >60 09/26/2022 0758          ASSESSMENT and THERAPY PLAN:   No problem-specific Assessment & Plan notes found for this encounter.    All questions were answered. The patient knows to call the clinic with any problems, questions or concerns. We can certainly see the patient much sooner if necessary.  Total encounter time:*** minutes*in face-to-face visit time, chart review, lab review, care coordination, order entry, and documentation of the encounter time.    Lillard Anes, NP 09/26/22 9:08 AM Medical Oncology and Hematology [facility] [address] [place], [place] [address] Tel. [phone]    Fax. [phone]  *Total Encounter Time as defined by the Centers for Medicare and Medicaid Services includes, in addition to the face-to-face time of a patient visit (documented in the note above) non-face-to-face time: obtaining and reviewing outside history, ordering and reviewing medications, tests or procedures, care coordination (communications with other health care professionals or caregivers) and documentation in the medical record.

## 2022-09-26 NOTE — Patient Instructions (Signed)
[facility]  Discharge Instructions: Thank you for choosing [facility] to provide your oncology and hematology care.   If you have a lab appointment with the Cancer Center, please go directly to the Cancer Center and check in at the registration area.   Wear comfortable clothing and clothing appropriate for easy access to any Portacath or PICC line.   We strive to give you quality time with your provider. You may need to reschedule your appointment if you arrive late (15 or more minutes).  Arriving late affects you and other patients whose appointments are after yours.  Also, if you miss three or more appointments without notifying the office, you may be dismissed from the clinic at the provider's discretion.      For prescription refill requests, have your pharmacy contact our office and allow 72 hours for refills to be completed.    Today you received the following chemotherapy and/or immunotherapy agents Cisplatin      To help prevent nausea and vomiting after your treatment, we encourage you to take your nausea medication as directed.  BELOW ARE SYMPTOMS THAT SHOULD BE REPORTED IMMEDIATELY: *FEVER GREATER THAN 100.4 F (38 C) OR HIGHER *CHILLS OR SWEATING *NAUSEA AND VOMITING THAT IS NOT CONTROLLED WITH YOUR NAUSEA MEDICATION *UNUSUAL SHORTNESS OF BREATH *UNUSUAL BRUISING OR BLEEDING *URINARY PROBLEMS (pain or burning when urinating, or frequent urination) *BOWEL PROBLEMS (unusual diarrhea, constipation, pain near the anus) TENDERNESS IN MOUTH AND THROAT WITH OR WITHOUT PRESENCE OF ULCERS (sore throat, sores in mouth, or a toothache) UNUSUAL RASH, SWELLING OR PAIN  UNUSUAL VAGINAL DISCHARGE OR ITCHING   Items with * indicate a potential emergency and should be followed up as soon as possible or go to the Emergency Department if any problems should occur.  Please show the CHEMOTHERAPY ALERT CARD or IMMUNOTHERAPY ALERT CARD at  check-in to the Emergency Department and triage nurse.  Should you have questions after your visit or need to cancel or reschedule your appointment, please contact [facility]  Dept: [phone]  and follow the prompts.  Office hours are 8:00 a.m. to 4:30 p.m. Monday - Friday. Please note that voicemails left after 4:00 p.m. may not be returned until the following business day.  We are closed weekends and major holidays. You have access to a nurse at all times for urgent questions. Please call the main number to the clinic Dept: [phone] and follow the prompts.   For any non-urgent questions, you may also contact your provider using MyChart. We now offer e-Visits for anyone 18 and older to request care online for non-urgent symptoms. For details visit [email or web address].   Also download the MyChart app! Go to the app store, search "MyChart", open the app, select [facility], and log in with your MyChart username and password.   

## 2022-09-26 NOTE — Progress Notes (Signed)
Nutrition Follow-up:  Patient with supraglottic cancer. She is receiving concurrent chemoradiation with weekly cisplatin.   Met with patient in infusion. She reports feeling poorly the last few days. Her throat is sore and having pain with swallowing. Patient did not tolerate viscous lidocaine. Saliva is thick. She is doing baking soda salt water rinses. She is not eating. Everything taste horrible. She as persistent nausea. She is taking antiemetics as prescribed. These are not working for her. Patient "tries" to drink 2 CIB daily. Patient recalls giving half carton of [facility] via tube. She endorses significant regurgitation of formula which taste terrible. She denies vomiting, abdominal bloating, diarrhea after bolus.    Medications: reviewed   Labs: Na 134, Mg 1.4  Anthropometrics: Wts continue to trend down. Pt 143 lb 8 oz today   6/13 - 146 lb 1.6 oz 6/6 - 148 lb 14.4 oz 5/29 - 151 lb 4.8 oz  5/23 - 155 lb 8 oz    8% wt loss in 3 weeks - severe for time frame  Estimated Energy Needs  Kcals: 2115-2400 Protein: 92-106 Fluid: >2.1 L  NUTRITION DIAGNOSIS: Predicted suboptimal intake continues - working to increase TF   INTERVENTION:  Reinforced importance of adequate calorie/protein energy intake to maintain strength/weights 2D abd x-ray on 6/17 to confirm tube placement - report pending Patient agreeable to try sherbet during infusion - she did not like this Suggested ginger ale/sprite rinse for thick saliva - pt did not like taste of ginger ale, she agrees to try sprite later Encouraged increasing CIB - recommend 5 daily if not using tube Consider trial of scopolamine patch for persistent nausea     MONITORING, EVALUATION, GOAL: weight trends, intake   NEXT VISIT: Wednesday June 26 during infusion

## 2022-09-26 NOTE — Progress Notes (Signed)
Scopolamine patch ordered per NP at RD request. Husband confirmed pharmacy of choice is [facility] on [address].

## 2022-09-27 ENCOUNTER — Ambulatory Visit: Payer: 59 | Attending: Radiation Oncology

## 2022-09-27 ENCOUNTER — Other Ambulatory Visit: Payer: Self-pay

## 2022-09-27 ENCOUNTER — Ambulatory Visit
Admission: RE | Admit: 2022-09-27 | Discharge: 2022-09-27 | Disposition: A | Discharge status: Home / Self Care | Payer: 59 | Source: Ambulatory Visit | Attending: Radiation Oncology | Admitting: Radiation Oncology

## 2022-09-27 ENCOUNTER — Encounter: Payer: Self-pay | Admitting: Hematology and Oncology

## 2022-09-27 DIAGNOSIS — Z510 Encounter for antineoplastic radiation therapy: Secondary | ICD-10-CM | POA: Diagnosis not present

## 2022-09-27 DIAGNOSIS — R1310 Dysphagia, unspecified: Secondary | ICD-10-CM | POA: Diagnosis not present

## 2022-09-27 DIAGNOSIS — D696 Thrombocytopenia, unspecified: Secondary | ICD-10-CM | POA: Diagnosis not present

## 2022-09-27 DIAGNOSIS — C140 Malignant neoplasm of pharynx, unspecified: Secondary | ICD-10-CM | POA: Diagnosis not present

## 2022-09-27 DIAGNOSIS — Z5111 Encounter for antineoplastic chemotherapy: Secondary | ICD-10-CM | POA: Diagnosis not present

## 2022-09-27 DIAGNOSIS — C770 Secondary and unspecified malignant neoplasm of lymph nodes of head, face and neck: Secondary | ICD-10-CM | POA: Diagnosis not present

## 2022-09-27 DIAGNOSIS — Z79899 Other long term (current) drug therapy: Secondary | ICD-10-CM | POA: Diagnosis not present

## 2022-09-27 DIAGNOSIS — E860 Dehydration: Secondary | ICD-10-CM | POA: Diagnosis not present

## 2022-09-27 LAB — RAD ONC ARIA SESSION SUMMARY
Course Elapsed Days: 38
Plan Fractions Treated to Date: 28
Plan Prescribed Dose Per Fraction: 2 Gy
Plan Total Fractions Prescribed: 35
Plan Total Prescribed Dose: 70 Gy
Reference Point Dosage Given to Date: 56 Gy
Reference Point Session Dosage Given: 2 Gy
Session Number: 28

## 2022-09-27 NOTE — Assessment & Plan Note (Signed)
Nichole Dominguez is a 67 year old woman with stage I squamous cell carcinoma here today for follow-up and evaluation prior to receiving concurrent chemoradiation.  She will proceed with treatment today.  Her labs are stable and she is tolerating it well.  She does have hypomagnesemia and we will give her additional magnesium today.  She is experiencing weight loss and will follow-up with nutrition today about her swallowing issues.  Fatigue: I recommended continued energy conservation.    RTC in 1 week for labs, f/u, and her next chemo.

## 2022-09-27 NOTE — Therapy (Signed)
OUTPATIENT SPEECH LANGUAGE PATHOLOGY ONCOLOGY TREATMENT   Patient Name: Nichole Dominguez MRN: [id number] DOB:1955-12-25, 67 y.o., female Today's Date: 09/27/2022  PCP: [name], NP REFERRING PROVIDER: Lonie Peak, MD  END OF SESSION:  End of Session - 09/27/22 1052     Visit Number 2    Number of Visits 7    Date for SLP Re-Evaluation 11/21/22    SLP Start Time 1050    SLP Stop Time  1120    SLP Time Calculation (min) 30 min    Activity Tolerance Patient tolerated treatment well             Past Medical History:  Diagnosis Date   Allergy    GERD (gastroesophageal reflux disease)    Osteopenia 01/04/2020   Plantar fasciitis    Seasonal allergies    Past Surgical History:  Procedure Laterality Date   COLONOSCOPY  2011   IR GASTROSTOMY TUBE MOD SED  08/21/2022   IR IMAGING GUIDED PORT INSERTION  08/21/2022   IR PATIENT EVAL TECH 0-60 MINS  08/31/2022   IR PATIENT EVAL TECH 0-60 MINS  09/07/2022   MOUTH SURGERY  01/2020   implant inserted   TUBAL LIGATION  1992   Patient Active Problem List   Diagnosis Date Noted   Port-A-Cath in place 08/24/2022   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   Lower extremity edema 10/03/2021   Hypertension 08/23/2021   Neck mass 08/23/2021   Eczema 12/01/2020   Osteopenia 01/04/2020   Pap smear for cervical cancer screening 10/06/2014   Thyroid nodule 10/06/2014   Routine general medical examination at a health care facility 08/21/2013    ONSET DATE: See "pertinent hx" below   REFERRING DIAG: Secondary malignant neoplasm of cervical lymph node   THERAPY DIAG:  Dysphagia, unspecified type  Rationale for Evaluation and Treatment: Rehabilitation  SUBJECTIVE:   SUBJECTIVE STATEMENT: "I just drink water - I try to choke down the CIB, but I'd rather just drink the tube feed stuff. Yuck!" Pt accompanied by: family member  PERTINENT HISTORY:   05/07/22 She saw Dr. Marene Lenz and laryngoscopy was performed which revealed a relatively indurated mass lesion extending from the mid parotid gland to level II in the neck measuring approx. 4 cm. Biopsy of the parotid mass was non diagnostic. 06/26/22 CT neck completed after she began to develop jaw pain and right neck pain. 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 The mass appeared to encase a portion of the right external carotid artery and partially encases the right internal carotid artery Prominent right-sided cervical and upper mediastinal lymph nodes were also appreciated, suspicious for metastatic spread of  disease. 07/17/22 She saw Dr. Christoper Allegra who did a Laryngoscopy performed during this visit was notable for mild right pharyngeal fullness, mild right vocal fold hypomobility, and limited anterior movement of the right tongue. Based on CT findings, Dr. Christoper Allegra recommended proceeding with repeat IR guided biopsy of the mass, as well as staging work-up including an MRI of the neck and PET scan. 07/27/22 MRI of the face showed the infiltrative mass lesion centered in the right carotid, parapharyngeal and parotid spaces with extensive surrounding local invasion, and findings concerning for metastatic adenopathy in the right neck and additional nonspecific mildly enlarged  and rounded lymph nodes involving the upper mediastinum and contralateral left jugular chain. MRI also showed evidence of tumor extension within close proximity to multiple skill base foramina, along with evidence of right hypoglossal nerve involvement contributing to right hemitongue denervation changes, and tumor thrombus within the encased right internal jugular vein. MRI otherwise showed no evidence of intracranial perineural tumor spread. 07/27/22 FNA of the right neck  mass collected on 07/27/22 showed carcinoma with squamous features, favoring squamous cell carcinoma (however, other tumors with squamous differentiation cannot entirely be excluded); P16 diffusely positive; HPV focally positive. 08/02/22 PET demonstrated  the dominant right level IIa metastatic nodal mass measuring 4.9 x 2.5 cm with an SUV max of 15; multiple ipsilateral nodal metastases involving the right level 3, level 4, and level 5 lymph node stations including the dominant right level 3 nodal metastasis (measuring 1.1 cm) coming in contact with the right oropharynx without discrete primary tumor; FDG avidity within the left palatine tonsil (normal in size and with a max SUV of 4.4); and mildly FDG avid mediastinal, bihilar, and subcarinal lymph nodes. No other sites concerning for metastatic disease were appreciated elsewhere in the body. Given that there is encasement of the carotid, surgical resection was not advised by Dr. Christoper Allegra and  chemo/radiation was recommended.08/10/22 Consult with Dr. Basilio Cairo & 08/17/22 Consult with Dr. Al Pimple. She will receive chemo/radiation. Treatment plan:  She will receive 35 fractions of radiation to her Oropharynx and bilateral neck with weekly cisplatin. She started radiation on 08/20/22 and will start chemo on 08/24/22 and will complete 10/08/22. Pretreatment procedures. 08/21/22 PEG/PAC  PAIN:  Are you having pain? Yes: NPRS scale: 1/10 Pain location: throat Pain description: lsore Aggravating factors: nothing Relieving factors: meds  FALLS: Has patient fallen in last 6 months?  See PT evaluation for details  PATIENT GOALS: Maintain WNL swallowing  OBJECTIVE:    TODAY'S TREATMENT:                                                                                                                                         DATE:  09/27/22: Pt has found some SLP videos on youTube for assistance with HEP. However, she req'd min A occasionally for tongue protrusion with Masako,  and procedure with Clair Gulling (was not performing swallow). Independent by session end. Min cues necessary for rationale. Pt was strongly urged to complete up to at LEAST 20 reps of effortful, Masako, Mendelsohn, and supraglottic swallow/day.  She is drinking primarily only water at this time and politely refuses applesauce. Experiencing dysgeusia, xerostomia, thickened saliva, and nausea and upset stomach from tube feeds. Today no overt s/sx oral or pharyngeal difficulty with water sips, nor as she was completing HEP with very small water sips/drops.   08/23/22 (eval): Research states the risk for dysphagia increases due to radiation and/or chemotherapy treatment due to a variety of factors, so SLP educated the pt  about the possibility of reduced/limited ability for PO intake during rad tx. SLP also educated pt regarding possible changes to swallowing musculature after rad tx, and why adherence to dysphagia HEP provided today and PO consumption was necessary to inhibit muscle fibrosis following rad tx and to mitigate muscle disuse atrophy. SLP informed pt why this would be detrimental to their swallowing status and to their pulmonary health. Pt demonstrated understanding of these things to SLP. SLP encouraged pt to safely eat and drink as deep into their radiation/chemotherapy as possible to provide the best possible long-term swallowing outcome for pt.    SLP then developed an individualized HEP for pt involving oral and pharyngeal strengthening and ROM and pt was instructed how to perform these exercises, including SLP demonstration. After SLP demonstration, pt return demonstrated each exercise. SLP ensured pt performance was correct prior to educating pt on next exercise. Pt required usual min cues faded to modified independent to perform HEP. Pt was instructed to complete this program 6-7 days/week, at least 2 times a day until 6 months after her last day of rad tx, and then x2 a week after that,  indefinitely. Pt also given option to complete at least 20-30 of exercises necessitating swallowing but not completing >5 at once. Among other modifications for days when pt cannot functionally swallow, SLP also suggested pt to perform only non-swallowing tasks on the handout/HEP, and if necessary to cycle through the swallowing portion so the full program of exercises can be completed instead of fatiguing on one of the swallowing exercises and being unable to perform the other swallowing exercises. SLP instructed that swallowing exercises should then be added back into the regimen as pt is able to do so.   PATIENT EDUCATION: Education details: HEP procedure and modification to HEP when difficulty experienced with swallowing during and after radiation course Person educated: Patient and friend Education method: Programmer, multimedia, Facilities manager, Verbal cues, and Handouts Education comprehension: verbalized understanding, returned demonstration, verbal cues required, and needs further education   ASSESSMENT:  CLINICAL IMPRESSION: Patient is a 67 y.o. F who was seen today for treatment of swallowing as they undergo radiation/chemoradiation therapy. See "today's treatment" for details. POs: Pt politely refused solids today     and drank thin liquids without overt s/s oral or pharyngeal difficulty. At this time pt swallowing is deemed WNL/WFL with these POs. There are no overt s/s aspiration PNA observed by SLP nor any reported by pt at this time. Data indicate that pt's swallow ability will likely decrease over the course of radiation/chemoradiation therapy and could very well decline over time following the conclusion of that therapy due to muscle disuse atrophy and/or muscle fibrosis. Pt will cont to need to be seen by SLP in order to assess safety of PO intake, assess the need for recommending any objective swallow assessment, and ensuring pt is correctly completing the individualized HEP.  OBJECTIVE  IMPAIRMENTS: include dysphagia. These impairments are limiting patient from safety when swallowing. Factors affecting potential to achieve goals and functional outcome are  none noted today. Patient will benefit from skilled SLP services to address above impairments and improve overall function.  REHAB POTENTIAL: Good  GOALS: Goals reviewed with patient? No   SHORT TERM GOALS: Target: 3rd total session     1. Pt will complete HEP with modified independence in 2 sessions Baseline: Goal status: Initial     2.  pt will tell SLP why pt is completing HEP with modified independence Baseline:  Goal status:  Initial     3.  pt ill describe 3 overt s/s aspiration PNA with modified independence Baseline:  Goal status: Initial     4.  pt will tell SLP how a food journal may help return to a more normalized diet Baseline:  Goal status: Initial     LONG TERM GOALS: Target: 7th total session   1.  pt will complete HEP with independence over two visits Baseline:  Goal status: Initial   2.  pt will describe how to modify HEP over time, and the timeline associated with reduction in HEP frequency with modified independence over two sessions Baseline:  Goal status: Initial   PLAN:   SLP FREQUENCY:  once approx every 4 weeks   SLP DURATION:  6 sessions (7 total sessions)   PLANNED INTERVENTIONS: Aspiration precaution training, Pharyngeal strengthening exercises, Diet toleration management , Environmental controls, Trials of upgraded texture/liquids, Cueing hierachy, Internal/external aids, SLP instruction and feedback, Compensatory strategies, and Patient/family education    [facility], CCC-SLP 09/27/2022, 10:54 AM

## 2022-09-28 ENCOUNTER — Other Ambulatory Visit: Payer: Self-pay

## 2022-09-28 ENCOUNTER — Ambulatory Visit
Admission: RE | Admit: 2022-09-28 | Discharge: 2022-09-28 | Disposition: A | Discharge status: Home / Self Care | Payer: 59 | Source: Ambulatory Visit | Attending: Radiation Oncology | Admitting: Radiation Oncology

## 2022-09-28 DIAGNOSIS — Z510 Encounter for antineoplastic radiation therapy: Secondary | ICD-10-CM | POA: Diagnosis not present

## 2022-09-28 DIAGNOSIS — R1310 Dysphagia, unspecified: Secondary | ICD-10-CM | POA: Diagnosis not present

## 2022-09-28 DIAGNOSIS — Z5111 Encounter for antineoplastic chemotherapy: Secondary | ICD-10-CM | POA: Diagnosis not present

## 2022-09-28 DIAGNOSIS — E860 Dehydration: Secondary | ICD-10-CM | POA: Diagnosis not present

## 2022-09-28 DIAGNOSIS — C140 Malignant neoplasm of pharynx, unspecified: Secondary | ICD-10-CM | POA: Diagnosis not present

## 2022-09-28 DIAGNOSIS — Z79899 Other long term (current) drug therapy: Secondary | ICD-10-CM | POA: Diagnosis not present

## 2022-09-28 DIAGNOSIS — C770 Secondary and unspecified malignant neoplasm of lymph nodes of head, face and neck: Secondary | ICD-10-CM | POA: Diagnosis not present

## 2022-09-28 DIAGNOSIS — D696 Thrombocytopenia, unspecified: Secondary | ICD-10-CM | POA: Diagnosis not present

## 2022-09-28 LAB — RAD ONC ARIA SESSION SUMMARY
Course Elapsed Days: 39
Plan Fractions Treated to Date: 29
Plan Prescribed Dose Per Fraction: 2 Gy
Plan Total Fractions Prescribed: 35
Plan Total Prescribed Dose: 70 Gy
Reference Point Dosage Given to Date: 58 Gy
Reference Point Session Dosage Given: 2 Gy
Session Number: 29

## 2022-10-01 ENCOUNTER — Ambulatory Visit
Admission: RE | Admit: 2022-10-01 | Discharge: 2022-10-01 | Disposition: A | Discharge status: Home / Self Care | Payer: 59 | Source: Ambulatory Visit | Attending: Radiation Oncology | Admitting: Radiation Oncology

## 2022-10-01 ENCOUNTER — Other Ambulatory Visit: Payer: Self-pay

## 2022-10-01 DIAGNOSIS — C770 Secondary and unspecified malignant neoplasm of lymph nodes of head, face and neck: Secondary | ICD-10-CM | POA: Diagnosis not present

## 2022-10-01 DIAGNOSIS — C140 Malignant neoplasm of pharynx, unspecified: Secondary | ICD-10-CM | POA: Diagnosis not present

## 2022-10-01 DIAGNOSIS — Z5111 Encounter for antineoplastic chemotherapy: Secondary | ICD-10-CM | POA: Diagnosis not present

## 2022-10-01 DIAGNOSIS — Z79899 Other long term (current) drug therapy: Secondary | ICD-10-CM | POA: Diagnosis not present

## 2022-10-01 DIAGNOSIS — E860 Dehydration: Secondary | ICD-10-CM | POA: Diagnosis not present

## 2022-10-01 DIAGNOSIS — Z510 Encounter for antineoplastic radiation therapy: Secondary | ICD-10-CM | POA: Diagnosis not present

## 2022-10-01 DIAGNOSIS — R1310 Dysphagia, unspecified: Secondary | ICD-10-CM | POA: Diagnosis not present

## 2022-10-01 DIAGNOSIS — D696 Thrombocytopenia, unspecified: Secondary | ICD-10-CM | POA: Diagnosis not present

## 2022-10-01 LAB — RAD ONC ARIA SESSION SUMMARY
Course Elapsed Days: 42
Plan Fractions Treated to Date: 30
Plan Prescribed Dose Per Fraction: 2 Gy
Plan Total Fractions Prescribed: 35
Plan Total Prescribed Dose: 70 Gy
Reference Point Dosage Given to Date: 60 Gy
Reference Point Session Dosage Given: 2 Gy
Session Number: 30

## 2022-10-02 ENCOUNTER — Other Ambulatory Visit: Payer: Self-pay

## 2022-10-02 ENCOUNTER — Ambulatory Visit
Admission: RE | Admit: 2022-10-02 | Discharge: 2022-10-02 | Disposition: A | Discharge status: Home / Self Care | Payer: 59 | Source: Ambulatory Visit | Attending: Radiation Oncology | Admitting: Radiation Oncology

## 2022-10-02 DIAGNOSIS — Z79899 Other long term (current) drug therapy: Secondary | ICD-10-CM | POA: Diagnosis not present

## 2022-10-02 DIAGNOSIS — Z5111 Encounter for antineoplastic chemotherapy: Secondary | ICD-10-CM | POA: Diagnosis not present

## 2022-10-02 DIAGNOSIS — E860 Dehydration: Secondary | ICD-10-CM | POA: Diagnosis not present

## 2022-10-02 DIAGNOSIS — R1310 Dysphagia, unspecified: Secondary | ICD-10-CM | POA: Diagnosis not present

## 2022-10-02 DIAGNOSIS — D696 Thrombocytopenia, unspecified: Secondary | ICD-10-CM | POA: Diagnosis not present

## 2022-10-02 DIAGNOSIS — C770 Secondary and unspecified malignant neoplasm of lymph nodes of head, face and neck: Secondary | ICD-10-CM | POA: Diagnosis not present

## 2022-10-02 DIAGNOSIS — C140 Malignant neoplasm of pharynx, unspecified: Secondary | ICD-10-CM | POA: Diagnosis not present

## 2022-10-02 DIAGNOSIS — Z510 Encounter for antineoplastic radiation therapy: Secondary | ICD-10-CM | POA: Diagnosis not present

## 2022-10-02 LAB — RAD ONC ARIA SESSION SUMMARY
Course Elapsed Days: 43
Plan Fractions Treated to Date: 31
Plan Prescribed Dose Per Fraction: 2 Gy
Plan Total Fractions Prescribed: 35
Plan Total Prescribed Dose: 70 Gy
Reference Point Dosage Given to Date: 62 Gy
Reference Point Session Dosage Given: 2 Gy
Session Number: 31

## 2022-10-03 ENCOUNTER — Inpatient Hospital Stay: Payer: 59

## 2022-10-03 ENCOUNTER — Other Ambulatory Visit: Payer: Self-pay

## 2022-10-03 ENCOUNTER — Other Ambulatory Visit (HOSPITAL_COMMUNITY): Payer: Self-pay

## 2022-10-03 ENCOUNTER — Inpatient Hospital Stay: Payer: 59 | Admitting: Dietician

## 2022-10-03 ENCOUNTER — Encounter: Payer: Self-pay | Admitting: Hematology and Oncology

## 2022-10-03 ENCOUNTER — Inpatient Hospital Stay (HOSPITAL_BASED_OUTPATIENT_CLINIC_OR_DEPARTMENT_OTHER): Payer: 59 | Admitting: Adult Health

## 2022-10-03 ENCOUNTER — Encounter: Payer: Self-pay | Admitting: Adult Health

## 2022-10-03 ENCOUNTER — Ambulatory Visit
Admission: RE | Admit: 2022-10-03 | Discharge: 2022-10-03 | Disposition: A | Discharge status: Home / Self Care | Payer: 59 | Source: Ambulatory Visit | Attending: Radiation Oncology | Admitting: Radiation Oncology

## 2022-10-03 ENCOUNTER — Telehealth: Payer: Self-pay | Admitting: Adult Health

## 2022-10-03 VITALS — BP 145/78 | HR 104 | Temp 98.0°F | Resp 16

## 2022-10-03 VITALS — BP 158/89 | HR 115 | Temp 97.3°F | Resp 16 | Wt 139.7 lb

## 2022-10-03 DIAGNOSIS — C770 Secondary and unspecified malignant neoplasm of lymph nodes of head, face and neck: Secondary | ICD-10-CM

## 2022-10-03 DIAGNOSIS — Z510 Encounter for antineoplastic radiation therapy: Secondary | ICD-10-CM | POA: Diagnosis not present

## 2022-10-03 DIAGNOSIS — C140 Malignant neoplasm of pharynx, unspecified: Secondary | ICD-10-CM | POA: Diagnosis not present

## 2022-10-03 DIAGNOSIS — Z79899 Other long term (current) drug therapy: Secondary | ICD-10-CM | POA: Diagnosis not present

## 2022-10-03 DIAGNOSIS — T451X5A Adverse effect of antineoplastic and immunosuppressive drugs, initial encounter: Secondary | ICD-10-CM

## 2022-10-03 DIAGNOSIS — R112 Nausea with vomiting, unspecified: Secondary | ICD-10-CM

## 2022-10-03 DIAGNOSIS — Z95828 Presence of other vascular implants and grafts: Secondary | ICD-10-CM

## 2022-10-03 DIAGNOSIS — E860 Dehydration: Secondary | ICD-10-CM | POA: Diagnosis not present

## 2022-10-03 DIAGNOSIS — K1230 Oral mucositis (ulcerative), unspecified: Secondary | ICD-10-CM

## 2022-10-03 DIAGNOSIS — Z5111 Encounter for antineoplastic chemotherapy: Secondary | ICD-10-CM | POA: Diagnosis not present

## 2022-10-03 DIAGNOSIS — D696 Thrombocytopenia, unspecified: Secondary | ICD-10-CM | POA: Diagnosis not present

## 2022-10-03 DIAGNOSIS — R1310 Dysphagia, unspecified: Secondary | ICD-10-CM | POA: Diagnosis not present

## 2022-10-03 DIAGNOSIS — B370 Candidal stomatitis: Secondary | ICD-10-CM

## 2022-10-03 LAB — CBC WITH DIFFERENTIAL (CANCER CENTER ONLY)
Abs Immature Granulocytes: 0.01 10*3/uL (ref 0.00–0.07)
Basophils Absolute: 0 10*3/uL (ref 0.0–0.1)
Basophils Relative: 1 %
Eosinophils Absolute: 0 10*3/uL (ref 0.0–0.5)
Eosinophils Relative: 0 %
HCT: 25.9 % — ABNORMAL LOW (ref 36.0–46.0)
Hemoglobin: 9.3 g/dL — ABNORMAL LOW (ref 12.0–15.0)
Immature Granulocytes: 1 %
Lymphocytes Relative: 11 %
Lymphs Abs: 0.2 10*3/uL — ABNORMAL LOW (ref 0.7–4.0)
MCH: 31.3 pg (ref 26.0–34.0)
MCHC: 35.9 g/dL (ref 30.0–36.0)
MCV: 87.2 fL (ref 80.0–100.0)
Monocytes Absolute: 0.3 10*3/uL (ref 0.1–1.0)
Monocytes Relative: 20 %
Neutro Abs: 1.1 10*3/uL — ABNORMAL LOW (ref 1.7–7.7)
Neutrophils Relative %: 67 %
Platelet Count: 88 10*3/uL — ABNORMAL LOW (ref 150–400)
RBC: 2.97 MIL/uL — ABNORMAL LOW (ref 3.87–5.11)
RDW: 11.4 % — ABNORMAL LOW (ref 11.5–15.5)
WBC Count: 1.7 10*3/uL — ABNORMAL LOW (ref 4.0–10.5)
nRBC: 0 % (ref 0.0–0.2)

## 2022-10-03 LAB — RAD ONC ARIA SESSION SUMMARY
Course Elapsed Days: 44
Plan Fractions Treated to Date: 32
Plan Prescribed Dose Per Fraction: 2 Gy
Plan Total Fractions Prescribed: 35
Plan Total Prescribed Dose: 70 Gy
Reference Point Dosage Given to Date: 64 Gy
Reference Point Session Dosage Given: 2 Gy
Session Number: 32

## 2022-10-03 LAB — BASIC METABOLIC PANEL - CANCER CENTER ONLY
Anion gap: 8 (ref 5–15)
BUN: 18 mg/dL (ref 8–23)
CO2: 26 mmol/L (ref 22–32)
Calcium: 8.3 mg/dL — ABNORMAL LOW (ref 8.9–10.3)
Chloride: 99 mmol/L (ref 98–111)
Creatinine: 0.73 mg/dL (ref 0.44–1.00)
GFR, Estimated: >60 mL/min (ref >60)
Glucose, Bld: 117 mg/dL — ABNORMAL HIGH (ref 70–99)
Potassium: 4.1 mmol/L (ref 3.5–5.1)
Sodium: 133 mmol/L — ABNORMAL LOW (ref 135–145)

## 2022-10-03 LAB — MAGNESIUM: Magnesium: 1 mg/dL — ABNORMAL LOW (ref 1.7–2.4)

## 2022-10-03 MED ORDER — SODIUM CHLORIDE 0.9 % IV SOLN: INTRAVENOUS | Status: AC

## 2022-10-03 MED ORDER — ONDANSETRON HCL 4 MG/2ML IJ SOLN
8.0000 mg | Freq: Once | INTRAMUSCULAR | Status: AC
  Administered 2022-10-03: 8 mg via INTRAVENOUS
  Filled 2022-10-03: qty 4

## 2022-10-03 MED ORDER — SODIUM CHLORIDE 0.9% FLUSH
10.0000 mL | INTRAVENOUS | Status: DC | PRN
  Administered 2022-10-03: 10 mL

## 2022-10-03 MED ORDER — FLUCONAZOLE 200 MG PO TABS
200.0000 mg | ORAL_TABLET | Freq: Once | ORAL | Status: AC
  Administered 2022-10-03: 200 mg
  Filled 2022-10-03: qty 1

## 2022-10-03 MED ORDER — MAGNESIUM SULFATE 4 GM/100ML IV SOLN
4.0000 g | Freq: Once | INTRAVENOUS | Status: AC
  Administered 2022-10-03: 4 g via INTRAVENOUS
  Filled 2022-10-03: qty 100

## 2022-10-03 MED ORDER — HEPARIN SOD (PORK) LOCK FLUSH 100 UNIT/ML IV SOLN: 500.0000 [IU] | Freq: Once | INTRAVENOUS | Status: DC | PRN

## 2022-10-03 MED ORDER — SODIUM CHLORIDE 0.9% FLUSH
10.0000 mL | Freq: Once | INTRAVENOUS | Status: AC
  Administered 2022-10-03: 10 mL

## 2022-10-03 MED ORDER — ONDANSETRON 8 MG PO TBDP
8.0000 mg | ORAL_TABLET | Freq: Three times a day (TID) | ORAL | 0 refills | Status: DC | PRN
Start: 2022-10-03 — End: 2022-10-15

## 2022-10-03 MED ORDER — FAMOTIDINE IN NACL 20-0.9 MG/50ML-% IV SOLN
20.0000 mg | Freq: Once | INTRAVENOUS | Status: AC
  Administered 2022-10-03: 20 mg via INTRAVENOUS
  Filled 2022-10-03: qty 50

## 2022-10-03 MED ORDER — MAGNESIUM SULFATE 2 GM/50ML IV SOLN
2.0000 g | Freq: Once | INTRAVENOUS | Status: DC
Start: 2022-10-03 — End: 2022-10-03

## 2022-10-03 MED ORDER — FAMOTIDINE IN NACL 20-0.9 MG/50ML-% IV SOLN
20.0000 mg | Freq: Once | INTRAVENOUS | Status: DC
Start: 2022-10-03 — End: 2022-10-03

## 2022-10-03 MED ORDER — DIPHENHYDRAMINE HCL 12.5 MG/5ML PO LIQD
5.00 mL | Freq: Three times a day (TID) | ORAL | 0 refills | Status: DC
Start: 2022-10-03 — End: 2022-10-12
  Filled 2022-10-03: qty 240, 14d supply, fill #0

## 2022-10-03 NOTE — Patient Instructions (Signed)

## 2022-10-03 NOTE — Patient Instructions (Signed)

## 2022-10-03 NOTE — Progress Notes (Signed)
[facility] Cancer Follow up:    [name], NP [address] [place] [address]   DIAGNOSIS:  Cancer Staging  Secondary malignant neoplasm of cervical lymph node (HCC) Staging form: Pharynx - HPV-Mediated Oropharynx, AJCC 8th Edition - Clinical stage from 08/10/2022: Stage I (cT0, cN1, cM0, p16+) - Signed by Lonie Peak, MD on 08/11/2022 Stage prefix: Initial diagnosis   SUMMARY OF ONCOLOGIC HISTORY: Oncology History  Secondary malignant neoplasm of cervical lymph node (HCC)  06/26/2022 Imaging   Neck CT: a 4.4 cm x 3.0 cm x 4.1 cm soft tissue mass centered in the right parapharyngeal space, favored to be arising from the deep lobe of the right parotid gland, 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 The mass appeared to encase a portion of the right external carotid artery and partially encases the right internal carotid artery Prominent right-sided cervical and upper mediastinal  lymph nodes were also appreciated, suspicious for metastatic spread of  disease    07/24/2022 Imaging   MRI of the face and neck  showed the infiltrative mass lesion centered in the right carotid, parapharyngeal and parotid spaces with extensive surrounding local invasion, and findings concerning for metastatic adenopathy in the right neck and additional nonspecific mildly enlarged and rounded lymph nodes involving the upper mediastinum and contralateral left jugular chain. MRI also showed evidence of tumor extension within close proximity to multiple skill base foramina, along with evidence of right hypoglossal nerve involvement contributing to right hemitongue denervation changes, and tumor thrombus within the encased right internal jugular vein. MRI otherwise showed no evidence of intracranial perineural tumor spread.    07/27/2022 Initial Biopsy   FNA of the right neck mass showed carcinoma with squamous features, favoring squamous cell carcinoma (however,  other tumors with squamous differentiation cannot entirely be excluded); P16 diffusely positive; HPV focally positive.    08/02/2022 PET scan   Whole body PET scan: the dominant right level IIa metastatic nodal mass measuring 4.9 x 2.5 cm with an SUV max of 15; multiple ipsilateral nodal metastases involving the right level 3, level 4, and level 5 lymph node stations including the dominant right level 3 nodal metastasis (measuring 1.1 cm) coming in contact with the right oropharynx without discrete primary tumor; FDG avidity within the left palatine tonsil (normal in size and with a max SUV of 4.4); and mildly FDG avid mediastinal, bihilar, and subcarinal lymph nodes. No other sites concerning for metastatic disease were appreciated elsewhere in the body.    08/10/2022 Cancer Staging   Staging form: Pharynx - HPV-Mediated Oropharynx, AJCC 8th Edition - Clinical stage from 08/10/2022: Stage I (cT0, cN1, cM0, p16+) - Signed by Lonie Peak, MD on 08/11/2022 Stage prefix: Initial diagnosis   08/21/2022 Procedure   Port placement and G-tube placement   08/23/2022 - 10/08/2022 Radiation Therapy   Concurrent radiation   08/24/2022 -  Chemotherapy   Patient is on Treatment Plan : HEAD/NECK Cisplatin (40) q7d       CURRENT THERAPY: Concurrent chemoradiation  INTERVAL HISTORY: Nichole Dominguez 67 y.o. female returns for f/u of her head and neck cancer on treatment with concurrent chemoradiation.  She has received 6 cycles of weekly Cisplatin and is here today for evalutaion prior to receiving her seventh.  She is struggled this past week with increased pain in her throat.  She is having difficulty in keeping things down even sipping water is difficult.  She has been attempting to use her tube  feeds however recently she took in 120 mL and vomited it all up.  She denies any fevers or chills.  She is slightly leukopenic today with her white blood cells at 1.7.  Her magnesium is 1 today and her platelet count is  88.   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  MEDICAL HISTORY: Past Medical History:  Diagnosis Date   Allergy    GERD (gastroesophageal reflux disease)    Osteopenia 01/04/2020   Plantar fasciitis    Seasonal allergies     SURGICAL HISTORY: Past Surgical History:  Procedure Laterality Date   COLONOSCOPY  2011   IR GASTROSTOMY TUBE MOD SED  08/21/2022   IR IMAGING GUIDED PORT INSERTION  08/21/2022   IR PATIENT EVAL TECH 0-60 MINS  08/31/2022   IR PATIENT EVAL TECH 0-60 MINS  09/07/2022   MOUTH SURGERY  01/2020   implant inserted   TUBAL LIGATION  1992    SOCIAL HISTORY: Social History   Socioeconomic History   Marital status: Married    Spouse name: Not on file   Number of children: Not on file   Years of education: Not on file   Highest education level: Not on file  Occupational History   Not on file  Tobacco Use   Smoking status: Former    Packs/day: 1.00    Years: 24.00    Additional pack years: 0.00    Total pack years: 24.00    Types: Cigarettes    Quit date: 10/10/1996    Years since quitting: 25.9   Smokeless tobacco: Never  Vaping Use   Vaping Use: Never used  Substance and Sexual Activity   Alcohol use: Yes    Alcohol/week: 0.0 standard drinks of alcohol    Comment: 1 shot vodka daily   Drug use: No   Sexual activity: Yes    Partners: Male  Other Topics Concern   Not on file  Social History Narrative   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    Lives with husband   1 daughter in [place], 28 grandchildren   Son died   2 grown stepchildren- both Designer, multimedia, grandchildren   Enjoys Corporate investment banker   Social Determinants of Corporate investment banker Strain: Not on file  Food Insecurity: No Food Insecurity (08/09/2022)   Hunger Vital Sign    Worried About Running Out of Food in the Last Year: Never true    Ran Out of Food in the Last Year: Never true  Transportation Needs: No Transportation Needs (08/09/2022)   PRAPARE - Administrator, Civil Service (Medical): No    Lack of Transportation (Non-Medical): No  Physical Activity: Not on file  Stress: Not on file  Social Connections: Not on file  Intimate Partner Violence: Not At Risk (08/09/2022)   Humiliation, Afraid, Rape, and Kick questionnaire    Fear of Current or Ex-Partner: No    Emotionally Abused: No    Physically Abused: No  Sexually Abused: No    FAMILY HISTORY: Family History  Problem Relation Age of Onset   Diabetes Mother    Heart disease Mother        chf   Hypertension Mother    Cervical cancer Mother    Obesity Mother    Colon polyps Mother    Thyroid cancer Father    Cancer Father        thyroid x2   Heart attack Father    Breast cancer Sister    Lupus Sister    Obesity Maternal Grandmother    Diabetes Maternal Grandmother    Heart disease Maternal Grandmother    Hypertension Maternal Grandmother    Stroke Maternal Grandmother    Diabetes Mellitus II Paternal Grandmother        died from diabetic coma   Cancer Paternal Grandfather        not sure what type   Heart attack Son    Esophageal cancer Neg Hx    Stomach cancer Neg Hx    Rectal cancer Neg Hx    Colon cancer Neg Hx     Review of Systems  Constitutional:  Positive for appetite change and fatigue. Negative for chills, fever and unexpected weight change.  HENT:   Positive for sore throat and trouble swallowing. Negative for hearing  loss, lump/mass and mouth sores.   Eyes:  Negative for eye problems and icterus.  Respiratory:  Negative for chest tightness, cough and shortness of breath.   Cardiovascular:  Negative for chest pain, leg swelling and palpitations.  Gastrointestinal:  Positive for nausea and vomiting. Negative for abdominal distention, abdominal pain, blood in stool, constipation, diarrhea and rectal pain.  Endocrine: Negative for hot flashes.  Genitourinary:  Negative for difficulty urinating.   Musculoskeletal:  Negative for arthralgias.  Skin:  Negative for itching and rash.  Neurological:  Negative for dizziness, extremity weakness, headaches and numbness.  Hematological:  Negative for adenopathy. Does not bruise/bleed easily.  Psychiatric/Behavioral:  Negative for depression. The patient is not nervous/anxious.       PHYSICAL EXAMINATION    Vitals:   10/03/22 0929  BP: (!) 158/89  Pulse: (!) 115  Resp: 16  Temp: (!) 97.3 F (36.3 C)  SpO2: 97%    Physical Exam Constitutional:      General: She is not in acute distress.    Appearance: Normal appearance. She is ill-appearing (Appears tired, nontoxic but unwell.). She is not toxic-appearing.  HENT:     Head: Normocephalic and atraumatic.     Mouth/Throat:     Mouth: Mucous membranes are moist.     Pharynx: Oropharyngeal exudate (1 very small area in posterior oropharynx of thrush.) present. No posterior oropharyngeal erythema.  Eyes:     General: No scleral icterus. Cardiovascular:     Rate and Rhythm: Normal rate and regular rhythm.     Pulses: Normal pulses.     Heart sounds: Normal heart sounds.  Pulmonary:     Effort: Pulmonary effort is normal.     Breath sounds: Normal breath sounds.  Abdominal:     General: Abdomen is flat. Bowel sounds are normal. There is no distension.     Palpations: Abdomen is soft.     Tenderness: There is no abdominal tenderness.  Musculoskeletal:        General: No swelling.     Cervical back:  Neck supple.  Lymphadenopathy:     Cervical: No cervical adenopathy.  Skin:    General:  Skin is warm and dry.     Findings: No rash.  Neurological:     General: No focal deficit present.     Mental Status: She is alert.  Psychiatric:        Mood and Affect: Mood normal.        Behavior: Behavior normal.     LABORATORY DATA:  CBC    Component Value Date/Time   WBC 1.7 (L) 10/03/2022 0912   WBC 7.7 08/21/2022 1000   RBC 2.97 (L) 10/03/2022 0912   HGB 9.3 (L) 10/03/2022 0912   HCT 25.9 (L) 10/03/2022 0912   PLT 88 (L) 10/03/2022 0912   MCV 87.2 10/03/2022 0912   MCH 31.3 10/03/2022 0912   MCHC 35.9 10/03/2022 0912   RDW 11.4 (L) 10/03/2022 0912   LYMPHSABS 0.2 (L) 10/03/2022 0912   MONOABS 0.3 10/03/2022 0912   EOSABS 0.0 10/03/2022 0912   BASOSABS 0.0 10/03/2022 0912    CMP     Component Value Date/Time   NA 133 (L) 10/03/2022 0912   K 4.1 10/03/2022 0912   CL 99 10/03/2022 0912   CO2 26 10/03/2022 0912   GLUCOSE 117 (H) 10/03/2022 0912   BUN 18 10/03/2022 0912   CREATININE 0.73 10/03/2022 0912   CREATININE 1.02 (H) 12/28/2019 0749   CALCIUM 8.3 (L) 10/03/2022 0912   PROT 7.3 12/28/2019 0749   ALBUMIN 4.2 12/24/2018 0942   AST 18 12/28/2019 0749   ALT 49 (H) 12/28/2019 0749   ALKPHOS 124 (H) 12/24/2018 0942   BILITOT 0.7 12/28/2019 0749   GFRNONAA >60 10/03/2022 0912         ASSESSMENT and THERAPY PLAN:   Secondary malignant neoplasm of cervical lymph node (HCC) Nichole Dominguez is a 67 year old woman with stage I squamous cell carcinoma here today for follow-up and evaluation prior to receiving concurrent chemoradiation.  Nichole Dominguez has received 6 weekly cycles of concurrent cisplatin with her radiation.  She will not receive her seventh cycle today due to her thrombocytopenia and dehydration.  Hypomagnesemia: Her magnesium level is 1.0.  She will receive 4 g of IV magnesium today.  She will meet with nutrition.  We will also recheck her labs on  Friday. Dehydration: She will receive a liter of normal saline.  She will return tomorrow and Friday for IV fluids as well. Dysphagia: We sent in Magic mouthwash for her to take which may be easier to tolerate than the viscous lidocaine. Thrush: Will give her oral fluconazole today while she is in treatment. Nausea and vomiting: She will received IV Zofran with her IV fluids today.  I sent in Zofran and ODT since she is having difficulty swallowing.  She will return tomorrow for IV fluids, and in 2 days for labs and IV fluids.  I have scheduled her for labs and follow-up with Dr. Al Pimple in 4 weeks however based on her lab results on Friday we may see her sooner than that.  All questions were answered. The patient knows to call the clinic with any problems, questions or concerns. We can certainly see the patient much sooner if necessary.  Total encounter time:45 minutes*in face-to-face visit time, chart review, lab review, care coordination, order entry, and documentation of the encounter time.    Lillard Anes, NP 10/03/22 10:38 AM Medical Oncology and Hematology [facility] [address] [place], [place] [address] Tel. [phone]    Fax. [phone]  *Total Encounter Time as defined by the Centers for Medicare and Medicaid Services  includes, in addition to the face-to-face time of a patient visit (documented in the note above) non-face-to-face time: obtaining and reviewing outside history, ordering and reviewing medications, tests or procedures, care coordination (communications with other health care professionals or caregivers) and documentation in the medical record.

## 2022-10-03 NOTE — Progress Notes (Signed)
Nutrition Follow-up:  Patient with supraglottic cancer. She is receiving concurrent chemoradiation with weekly cisplatin.   Met with patient in infusion. Treatment held today secondary to labs. Patient receiving 4g Mg/IVF and 8mg  zofran. Patient states her throat is on fire. She drank some milk this morning if efforts to "cool it down." This helped a little. Her tongue is sore today. NP has called in MMW and fluconazole for thrush. Patient complains of persistent nausea, gagging with episodes of vomiting. She tried scopolamine patch, however stopped wearing after 6 days because there was no "noticeable improvement ." Her saliva is thick. She feels like this is stuck in her throat and unable to get this up. Patient is doing baking soda salt water rinses. Patient is not eating orally. Says even the thought of anything going in her mouth is nauseating. She endorses episode of vomiting after 120 ml bolus of [employer] yesterday. Patient reports sipping one cup of water the rest of the day. Patient previously tolerating CIB twice/day. She has not tried this the last few days. She did not tolerate Ensure. Patient feels this is related to high vitamin content. She is unable to tolerate MVI. Patient reports 5-6 episodes of watery diarrhea most every day. She will give additional water flush after diarrhea.  Fluconazole okay to crush and put in tube per pharmacy. Patient agreeable to try 120 ml bolus of Molli Posey prior to receiving fluconazole. She tolerated well. Assisted infusion RN with administering medication via PEG. RD to check on patient this afternoon. If nausea is well controlled, she is agreeable to additional 120 ml bolus of formula.    Medications: reviewed  Labs: Na 133, Mg 1.0  Anthropometrics: Wt 139 lb 11.2 oz today continue to trend down  6/19 - 143 lb 8 oz  6/13 - 146 lb 1.6 oz 6/6 - 148 lb 14.4 oz 5/29 - 151 lb 4.8 oz  5/23 - 155 lb 8 oz   Weights have decreased 3% in the last 7  days, 8% in 4 weeks - this is severe   Estimated Energy Needs   Kcals: 2115-2400 Protein: 92-106 Fluid: >2.1 L   NUTRITION DIAGNOSIS: Predicted suboptimal intake continues - working to increase TF   INTERVENTION:  Patient had completed IV hydration this afternoon. Spoke with infusion RN. Patient had episodes of gagging/spitting up. Denied emesis. Patient reported she would attempt to bolus remaining Molli Posey when she got home RD reinforced importance of calories/protein to preserve lean body mass/promote healing/maintain strength Patient agreeable to drinking CIB as she has previously tolerated this. Recommend 5/day Encouraged intake of smooth textures (pudding, ice cream, yogurt, shakes) as tolerated Patient is receiving IVF 6/27 + 6/28 Continue fluconazole + MMW for thrush Continue baking soda salt water rinses  Feeding tube tip in correct position per IR review of imaging Patient discontinued scopolamine patch     MONITORING, EVALUATION, GOAL: weight trends, intake, TF   NEXT VISIT: To be scheduled with IVF

## 2022-10-03 NOTE — Telephone Encounter (Signed)
Scheduled appointments per los and secure chat. Left voicemail.

## 2022-10-03 NOTE — Addendum Note (Signed)
Addended by: Noreene Filbert on: 10/03/2022 11:54 AM   Modules accepted: Orders

## 2022-10-03 NOTE — Progress Notes (Signed)
Patient receiving IVF, 4gm of magnesium for a level of 1.0 and zofran for nausea. No Chemotherapy treatment per Lillard Anes, NP. Patient to receive more IVF in the next two days and order for magic mouthwash and fluconazole.  Ok to crush fluconazole and put it in her G tube.

## 2022-10-03 NOTE — Assessment & Plan Note (Signed)
Nichole Dominguez is a 67 year old woman with stage I squamous cell carcinoma here today for follow-up and evaluation prior to receiving concurrent chemoradiation.  [name] has received 6 weekly cycles of concurrent cisplatin with her radiation.  She will not receive her seventh cycle today due to her thrombocytopenia and dehydration.  Hypomagnesemia: Her magnesium level is 1.0.  She will receive 4 g of IV magnesium today.  She will meet with nutrition.  We will also recheck her labs on Friday. Dehydration: She will receive a liter of normal saline.  She will return tomorrow and Friday for IV fluids as well. Dysphagia: We sent in Magic mouthwash for her to take which may be easier to tolerate than the viscous lidocaine. Thrush: Will give her oral fluconazole today while she is in treatment. Nausea and vomiting: She will received IV Zofran with her IV fluids today.  I sent in Zofran and ODT since she is having difficulty swallowing.  She will return tomorrow for IV fluids, and in 2 days for labs and IV fluids.  I have scheduled her for labs and follow-up with Dr. Al Pimple in 4 weeks however based on her lab results on Friday we may see her sooner than that.

## 2022-10-04 ENCOUNTER — Other Ambulatory Visit: Payer: Self-pay

## 2022-10-04 ENCOUNTER — Inpatient Hospital Stay: Payer: 59

## 2022-10-04 ENCOUNTER — Ambulatory Visit
Admission: RE | Admit: 2022-10-04 | Discharge: 2022-10-04 | Disposition: A | Discharge status: Home / Self Care | Payer: 59 | Source: Ambulatory Visit | Attending: Radiation Oncology | Admitting: Radiation Oncology

## 2022-10-04 VITALS — BP 161/81 | HR 102 | Resp 16

## 2022-10-04 DIAGNOSIS — Z79899 Other long term (current) drug therapy: Secondary | ICD-10-CM | POA: Diagnosis not present

## 2022-10-04 DIAGNOSIS — C770 Secondary and unspecified malignant neoplasm of lymph nodes of head, face and neck: Secondary | ICD-10-CM

## 2022-10-04 DIAGNOSIS — D696 Thrombocytopenia, unspecified: Secondary | ICD-10-CM | POA: Diagnosis not present

## 2022-10-04 DIAGNOSIS — Z510 Encounter for antineoplastic radiation therapy: Secondary | ICD-10-CM | POA: Diagnosis not present

## 2022-10-04 DIAGNOSIS — E860 Dehydration: Secondary | ICD-10-CM | POA: Diagnosis not present

## 2022-10-04 DIAGNOSIS — Z5111 Encounter for antineoplastic chemotherapy: Secondary | ICD-10-CM | POA: Diagnosis not present

## 2022-10-04 DIAGNOSIS — R1310 Dysphagia, unspecified: Secondary | ICD-10-CM | POA: Diagnosis not present

## 2022-10-04 DIAGNOSIS — C140 Malignant neoplasm of pharynx, unspecified: Secondary | ICD-10-CM | POA: Diagnosis not present

## 2022-10-04 LAB — RAD ONC ARIA SESSION SUMMARY
Course Elapsed Days: 45
Plan Fractions Treated to Date: 33
Plan Prescribed Dose Per Fraction: 2 Gy
Plan Total Fractions Prescribed: 35
Plan Total Prescribed Dose: 70 Gy
Reference Point Dosage Given to Date: 66 Gy
Reference Point Session Dosage Given: 2 Gy
Session Number: 33

## 2022-10-04 MED ORDER — HEPARIN SOD (PORK) LOCK FLUSH 100 UNIT/ML IV SOLN
500.0000 [IU] | Freq: Once | INTRAVENOUS | Status: AC | PRN
  Administered 2022-10-04: 500 [IU]

## 2022-10-04 MED ORDER — SODIUM CHLORIDE 0.9% FLUSH
10.0000 mL | INTRAVENOUS | Status: DC | PRN
  Administered 2022-10-04: 10 mL

## 2022-10-04 MED ORDER — SODIUM CHLORIDE 0.9 % IV SOLN: INTRAVENOUS | Status: AC

## 2022-10-04 MED ORDER — ONDANSETRON HCL 4 MG/2ML IJ SOLN
8.0000 mg | Freq: Once | INTRAMUSCULAR | Status: AC
  Administered 2022-10-04: 8 mg via INTRAVENOUS
  Filled 2022-10-04: qty 4

## 2022-10-04 MED ORDER — SODIUM CHLORIDE 0.9 % IV SOLN: Freq: Once | INTRAVENOUS | Status: AC

## 2022-10-04 NOTE — Progress Notes (Signed)
Per Mardella Layman NP's encounter note on 10/03/2022 Pt to receive 1 L normal saline on 10/04/2022 & 10/05/2022 for dehydration. IVFs and antiemetic orders in Pt's treatment plan.  Per Mardella Layman NP Pt to receive IV fluconazole on 10/05/2022.

## 2022-10-04 NOTE — Progress Notes (Signed)
Pt had c/o worsened throat sores and pain. This RN reached out to Alessandra Bevels, Oncology RN navigator. Victorino Dike saw Pt chairside. Pt was informed to take prescribed pain medication from Dr.Squire. This RN provided Pt education and Pt verbalized understanding and was agreeable to take pain medications as prescribed for throat pain r/t radiation side effects.

## 2022-10-04 NOTE — Patient Instructions (Signed)

## 2022-10-05 ENCOUNTER — Other Ambulatory Visit: Payer: Self-pay | Admitting: *Deleted

## 2022-10-05 ENCOUNTER — Inpatient Hospital Stay: Payer: 59

## 2022-10-05 ENCOUNTER — Other Ambulatory Visit: Payer: Self-pay

## 2022-10-05 ENCOUNTER — Encounter: Payer: Self-pay | Admitting: Dietician

## 2022-10-05 ENCOUNTER — Other Ambulatory Visit: Payer: Self-pay | Admitting: Adult Health

## 2022-10-05 ENCOUNTER — Ambulatory Visit
Admission: RE | Admit: 2022-10-05 | Discharge: 2022-10-05 | Disposition: A | Discharge status: Home / Self Care | Payer: 59 | Source: Ambulatory Visit | Attending: Radiation Oncology | Admitting: Radiation Oncology

## 2022-10-05 ENCOUNTER — Other Ambulatory Visit: Payer: Self-pay | Admitting: Radiation Oncology

## 2022-10-05 VITALS — BP 154/79 | HR 103 | Temp 98.1°F | Resp 16

## 2022-10-05 DIAGNOSIS — Z79899 Other long term (current) drug therapy: Secondary | ICD-10-CM | POA: Diagnosis not present

## 2022-10-05 DIAGNOSIS — C770 Secondary and unspecified malignant neoplasm of lymph nodes of head, face and neck: Secondary | ICD-10-CM

## 2022-10-05 DIAGNOSIS — C140 Malignant neoplasm of pharynx, unspecified: Secondary | ICD-10-CM | POA: Diagnosis not present

## 2022-10-05 DIAGNOSIS — R1310 Dysphagia, unspecified: Secondary | ICD-10-CM | POA: Diagnosis not present

## 2022-10-05 DIAGNOSIS — D696 Thrombocytopenia, unspecified: Secondary | ICD-10-CM | POA: Diagnosis not present

## 2022-10-05 DIAGNOSIS — E876 Hypokalemia: Secondary | ICD-10-CM

## 2022-10-05 DIAGNOSIS — Z5111 Encounter for antineoplastic chemotherapy: Secondary | ICD-10-CM | POA: Diagnosis not present

## 2022-10-05 DIAGNOSIS — Z510 Encounter for antineoplastic radiation therapy: Secondary | ICD-10-CM | POA: Diagnosis not present

## 2022-10-05 DIAGNOSIS — E860 Dehydration: Secondary | ICD-10-CM | POA: Diagnosis not present

## 2022-10-05 DIAGNOSIS — K1230 Oral mucositis (ulcerative), unspecified: Secondary | ICD-10-CM

## 2022-10-05 DIAGNOSIS — T451X5A Adverse effect of antineoplastic and immunosuppressive drugs, initial encounter: Secondary | ICD-10-CM

## 2022-10-05 LAB — CBC WITH DIFFERENTIAL (CANCER CENTER ONLY)
Abs Immature Granulocytes: 0.02 10*3/uL (ref 0.00–0.07)
Basophils Absolute: 0 10*3/uL (ref 0.0–0.1)
Basophils Relative: 0 %
Eosinophils Absolute: 0 10*3/uL (ref 0.0–0.5)
Eosinophils Relative: 0 %
HCT: 22.2 % — ABNORMAL LOW (ref 36.0–46.0)
Hemoglobin: 7.9 g/dL — ABNORMAL LOW (ref 12.0–15.0)
Immature Granulocytes: 1 %
Lymphocytes Relative: 6 %
Lymphs Abs: 0.1 10*3/uL — ABNORMAL LOW (ref 0.7–4.0)
MCH: 31.6 pg (ref 26.0–34.0)
MCHC: 35.6 g/dL (ref 30.0–36.0)
MCV: 88.8 fL (ref 80.0–100.0)
Monocytes Absolute: 0.3 10*3/uL (ref 0.1–1.0)
Monocytes Relative: 17 %
Neutro Abs: 1.2 10*3/uL — ABNORMAL LOW (ref 1.7–7.7)
Neutrophils Relative %: 76 %
Platelet Count: 84 10*3/uL — ABNORMAL LOW (ref 150–400)
RBC: 2.5 MIL/uL — ABNORMAL LOW (ref 3.87–5.11)
RDW: 11.6 % (ref 11.5–15.5)
Smear Review: NORMAL
WBC Count: 1.6 10*3/uL — ABNORMAL LOW (ref 4.0–10.5)
nRBC: 0 % (ref 0.0–0.2)

## 2022-10-05 LAB — RAD ONC ARIA SESSION SUMMARY
Course Elapsed Days: 46
Plan Fractions Treated to Date: 34
Plan Prescribed Dose Per Fraction: 2 Gy
Plan Total Fractions Prescribed: 35
Plan Total Prescribed Dose: 70 Gy
Reference Point Dosage Given to Date: 68 Gy
Reference Point Session Dosage Given: 2 Gy
Session Number: 34

## 2022-10-05 LAB — BASIC METABOLIC PANEL - CANCER CENTER ONLY
Anion gap: 8 (ref 5–15)
BUN: 12 mg/dL (ref 8–23)
CO2: 25 mmol/L (ref 22–32)
Calcium: 8 mg/dL — ABNORMAL LOW (ref 8.9–10.3)
Chloride: 101 mmol/L (ref 98–111)
Creatinine: 0.97 mg/dL (ref 0.44–1.00)
GFR, Estimated: >60 mL/min (ref >60)
Glucose, Bld: 122 mg/dL — ABNORMAL HIGH (ref 70–99)
Potassium: 3.3 mmol/L — ABNORMAL LOW (ref 3.5–5.1)
Sodium: 134 mmol/L — ABNORMAL LOW (ref 135–145)

## 2022-10-05 LAB — MAGNESIUM: Magnesium: 1.2 mg/dL — ABNORMAL LOW (ref 1.7–2.4)

## 2022-10-05 MED ORDER — MAGNESIUM SULFATE 2 GM/50ML IV SOLN
2.0000 g | Freq: Once | INTRAVENOUS | Status: AC
  Administered 2022-10-05: 2 g via INTRAVENOUS
  Filled 2022-10-05: qty 50

## 2022-10-05 MED ORDER — SODIUM CHLORIDE 0.9% FLUSH
10.0000 mL | INTRAVENOUS | Status: DC | PRN
  Administered 2022-10-05: 10 mL

## 2022-10-05 MED ORDER — FLUCONAZOLE IN SODIUM CHLORIDE 200-0.9 MG/100ML-% IV SOLN
200.0000 mg | Freq: Once | INTRAVENOUS | Status: AC
  Administered 2022-10-05: 200 mg via INTRAVENOUS
  Filled 2022-10-05: qty 100

## 2022-10-05 MED ORDER — ONDANSETRON HCL 4 MG/2ML IJ SOLN
8.0000 mg | Freq: Once | INTRAMUSCULAR | Status: AC
  Administered 2022-10-05: 8 mg via INTRAVENOUS
  Filled 2022-10-05: qty 4

## 2022-10-05 MED ORDER — HEPARIN SOD (PORK) LOCK FLUSH 100 UNIT/ML IV SOLN
500.0000 [IU] | Freq: Once | INTRAVENOUS | Status: AC | PRN
  Administered 2022-10-05: 500 [IU]

## 2022-10-05 MED ORDER — MAGNESIUM SULFATE 2 GM/50ML IV SOLN: 2.0000 g | Freq: Once | INTRAVENOUS | Status: DC

## 2022-10-05 MED ORDER — SODIUM CHLORIDE 0.9 % IV SOLN: INTRAVENOUS | Status: AC

## 2022-10-05 MED ORDER — POTASSIUM CHLORIDE 10 MEQ/100ML IV SOLN: 10.0000 meq | INTRAVENOUS | Status: DC

## 2022-10-05 MED ORDER — HYDROCODONE-ACETAMINOPHEN 7.5-325 MG/15ML PO SOLN
10.0000 mL | ORAL | 0 refills | Status: DC | PRN
Start: 2022-10-05 — End: 2022-10-08

## 2022-10-05 MED ORDER — SODIUM CHLORIDE 0.9 % IV SOLN: Freq: Once | INTRAVENOUS | Status: DC

## 2022-10-05 MED ORDER — POTASSIUM CHLORIDE 10 MEQ/100ML IV SOLN
10.0000 meq | INTRAVENOUS | Status: AC
  Administered 2022-10-05 (×2): 10 meq via INTRAVENOUS
  Filled 2022-10-05 (×2): qty 100

## 2022-10-05 NOTE — Progress Notes (Signed)
Error in charting.

## 2022-10-05 NOTE — Progress Notes (Signed)
Brief nutrition follow-up completed with patient in infusion. She is receiving supportive care with IVF. Patient noted with hypokalemia per labs. She agrees to stay for IV replacement.   Patient reports taking Hycet orally. This burned her mouth and throat, however this worked well to relieve her pain. She slept "like a baby last night." Patient reports tolerating 120 ml bolus of [employer] yesterday. She endorses nausea without vomiting after this. Patient took zofran which worked well.   Patient is unsure of which medications she needs and when to take them. Infusion RN has printed list, starred/labeled nausea and pain medications for patient.   Ongoing support and encouragement  Instructed pt to take nausea medication as scheduled  She will continue working to increase tube feedings as tolerated Patient consumed half CIB in infusion  Take pain medication as prescribed - refill has been called in per Dr. Basilio Cairo  Next Visit: Monday in infusion with Britta Mccreedy

## 2022-10-05 NOTE — Progress Notes (Signed)
Lillard Anes, NP and Dr. Al Pimple made aware of magnesium of 1.2, potassium of 3.3, hemoglobin 7.9, platelets of 84, and ANC of 1.2.  Received orders for magnesium and potassium replacement. Patient scheduled for lab work and IVF on Monday and Wednesday next week.  Patient agreeable to updated treatment plan and appointments.

## 2022-10-05 NOTE — Patient Instructions (Signed)
Oral Mucositis Oral mucositis is a mouth condition that may develop as a result of treatments for cancer. Sores may appear on the: Lips. Gums. Tongue. Throat. Top (roof) of the mouth. Bottom (floor) of the mouth. What are the causes? This condition is caused when cancer treatments damage the lining of the mouth. This condition can happen to anyone who is being treated with cancer therapies, including: Cancer medicines (chemotherapy) or targeted therapy. Radiation therapy. Bone marrow transplants and stem cell transplants. Oral mucositis is not caused by infection. However, the sores can become infected after they form. Infection can make oral mucositis worse. What increases the risk? The following factors may make you more likely to develop this condition: Having cancers that affect the blood, head, or neck. Receiving radiation therapy to the head and neck. Having high-dose chemotherapy alone or as part of bone marrow or stem cell transplant. In addition, there are other risks, including: Having poor oral hygiene, dental problems, or oral diseases. Wearing dentures that do not fit correctly. Having other medical conditions, such as diabetes, HIV, AIDS, or kidney disease. Using products that contain nicotine or tobacco, such as cigarettes, chewing tobacco, and vaping devices, such as e-cigarettes. Not drinking enough clear fluids. Drinking alcohol. What are the signs or symptoms? Symptoms of this condition include: Mouth sores. These sores may bleed. Color changes inside the mouth. Red, shiny areas may appear. White patches or pus in the mouth. Pain in the mouth and throat. This can make it painful to speak or swallow. Dryness and a burning feeling in the mouth. Saliva that is thick. Trouble eating, drinking, or swallowing. This can lead to weight loss. Symptoms of this condition can vary from mild to severe. Symptoms are usually seen 7-10 days after cancer treatment has  started. How is this diagnosed? This condition can be diagnosed with a physical exam. In some cases, lab tests may be done to check for an infection. How is this treated? Treatment depends on the severity of the condition. Oral mucositis often heals on its own. Sometimes, changes in the cancer treatment can help. Treatment may include medicine or therapies, such as: Antibiotics to treat infection. Medicine or therapies that help the cells in your mouth heal more quickly. Chewing on ice before treatment to help prevent mucositis. Medicine may also be given to help control pain. This may include: Pain medicines. Mouth rinses. Pain relievers that are swished around in the mouth (topical anesthetics). These make the mouth numb to ease the pain. Prescribed gels with medicine in them. The gel coats the mouth. This protects nerve endings and lessens the pain. Follow these instructions at home: Medicines Take or apply over-the-counter and prescription medicines only as told by your health care provider. If you were prescribed antibiotics, take them as told by your health care provider. Do not stop using the antibiotic even if you start to feel better. Do not use products that contain benzocaine to treat teething or mouth pain in children who are younger than 23 years old. This includes numbing gels. These products may cause a rare but serious blood condition. Eating and drinking  Talk to a diet and nutrition expert (dietitian) about what you should eat and drink if you have mucositis. Drink high-nutrition and high-calorie shakes or supplements. Eat bland and soft foods that are easy to eat. Drink enough fluid to keep your pee (urine) pale yellow. Do not eat foods that are hot, spicy, citrus, or hard to swallow. Do not drink alcohol. Try  sucking on ice chips or sugar-free frozen pops. This may help with pain. This also keeps your mouth moist. Try drinking through a straw. This may help mouth sores  from becoming irritated. Lifestyle  Keep your mouth clean. To do this: Brush your teeth carefully with a soft toothbrush at least two times each day. Use a gentle toothpaste. Ask your health care provider to recommend the right toothpaste for you. Use a soft sponge (oral swab) to clean your mouth and teeth instead of a toothbrush if mouth sores are severe. If you floss your teeth regularly, it is okay to continue to floss unless your health care provider tells you not to. Do not floss if your blood platelet count is below 40,000. Flossing with a very low platelet count can cause bleeding. Have your teeth cleaned regularly as recommended by your dentist. Check with your health care provider about the best timing. Rinse your mouth after every meal or as directed by your health care provider. Do not use mouthwash that contains alcohol. Ask your health care provider to recommend a mouthwash or mouth rinse. Do not use any products that contain nicotine or tobacco. These products include cigarettes, chewing tobacco, and vaping devices, such as e-cigarettes. If you need help quitting, ask your health care provider. General instructions If your lips are dry or cracked, apply a water-based moisturizer to your lips as needed. Follow instructions from your health care provider about: Cleaning mouth sores. Taking out your dentures. Changing your diet or finding other ways to get nutrients. This is important if you are losing weight. Contact a health care provider if: Your symptoms get worse. You develop new, open, or draining sores in your mouth. Your pain is not controlled with medicine. You have trouble speaking. You have a fever. You cannot swallow solid food or liquids. You have a lot of bleeding in your mouth. This information is not intended to replace advice given to you by your health care provider. Make sure you discuss any questions you have with your health care provider. Document Revised:  10/25/2021 Document Reviewed: 10/25/2021 Elsevier Patient Education  2024 [employer].

## 2022-10-05 NOTE — Addendum Note (Signed)
Addended by: Amaryllis Dyke on: 10/05/2022 10:26 AM   Modules accepted: Orders

## 2022-10-08 ENCOUNTER — Ambulatory Visit
Admission: RE | Admit: 2022-10-08 | Discharge: 2022-10-08 | Disposition: A | Discharge status: Home / Self Care | Payer: 59 | Source: Ambulatory Visit | Attending: Radiation Oncology | Admitting: Radiation Oncology

## 2022-10-08 ENCOUNTER — Other Ambulatory Visit: Payer: Self-pay

## 2022-10-08 ENCOUNTER — Inpatient Hospital Stay: Payer: 59

## 2022-10-08 ENCOUNTER — Inpatient Hospital Stay: Payer: 59 | Admitting: Nutrition

## 2022-10-08 ENCOUNTER — Inpatient Hospital Stay (HOSPITAL_BASED_OUTPATIENT_CLINIC_OR_DEPARTMENT_OTHER): Payer: 59 | Admitting: Adult Health

## 2022-10-08 ENCOUNTER — Telehealth: Payer: Self-pay

## 2022-10-08 ENCOUNTER — Other Ambulatory Visit: Payer: Self-pay | Admitting: *Deleted

## 2022-10-08 ENCOUNTER — Inpatient Hospital Stay: Payer: 59 | Attending: Hematology and Oncology

## 2022-10-08 VITALS — BP 149/83 | HR 101 | Temp 98.5°F | Resp 18 | Wt 143.8 lb

## 2022-10-08 DIAGNOSIS — R449 Unspecified symptoms and signs involving general sensations and perceptions: Secondary | ICD-10-CM | POA: Insufficient documentation

## 2022-10-08 DIAGNOSIS — C140 Malignant neoplasm of pharynx, unspecified: Secondary | ICD-10-CM | POA: Insufficient documentation

## 2022-10-08 DIAGNOSIS — Z79899 Other long term (current) drug therapy: Secondary | ICD-10-CM | POA: Insufficient documentation

## 2022-10-08 DIAGNOSIS — E860 Dehydration: Secondary | ICD-10-CM | POA: Diagnosis not present

## 2022-10-08 DIAGNOSIS — C770 Secondary and unspecified malignant neoplasm of lymph nodes of head, face and neck: Secondary | ICD-10-CM

## 2022-10-08 DIAGNOSIS — Z95828 Presence of other vascular implants and grafts: Secondary | ICD-10-CM

## 2022-10-08 DIAGNOSIS — Z87891 Personal history of nicotine dependence: Secondary | ICD-10-CM | POA: Diagnosis not present

## 2022-10-08 DIAGNOSIS — E876 Hypokalemia: Secondary | ICD-10-CM

## 2022-10-08 DIAGNOSIS — G893 Neoplasm related pain (acute) (chronic): Secondary | ICD-10-CM

## 2022-10-08 DIAGNOSIS — T451X5A Adverse effect of antineoplastic and immunosuppressive drugs, initial encounter: Secondary | ICD-10-CM

## 2022-10-08 DIAGNOSIS — Z510 Encounter for antineoplastic radiation therapy: Secondary | ICD-10-CM | POA: Diagnosis not present

## 2022-10-08 DIAGNOSIS — Z5111 Encounter for antineoplastic chemotherapy: Secondary | ICD-10-CM | POA: Insufficient documentation

## 2022-10-08 DIAGNOSIS — D696 Thrombocytopenia, unspecified: Secondary | ICD-10-CM | POA: Insufficient documentation

## 2022-10-08 LAB — CMP (CANCER CENTER ONLY)
ALT: 10 U/L (ref 0–44)
AST: 6 U/L — ABNORMAL LOW (ref 15–41)
Albumin: 3.2 g/dL — ABNORMAL LOW (ref 3.5–5.0)
Alkaline Phosphatase: 76 U/L (ref 38–126)
Anion gap: 12 (ref 5–15)
BUN: 13 mg/dL (ref 8–23)
CO2: 25 mmol/L (ref 22–32)
Calcium: 7.9 mg/dL — ABNORMAL LOW (ref 8.9–10.3)
Chloride: 96 mmol/L — ABNORMAL LOW (ref 98–111)
Creatinine: 0.83 mg/dL (ref 0.44–1.00)
GFR, Estimated: >60 mL/min (ref >60)
Glucose, Bld: 103 mg/dL — ABNORMAL HIGH (ref 70–99)
Potassium: 3.3 mmol/L — ABNORMAL LOW (ref 3.5–5.1)
Sodium: 133 mmol/L — ABNORMAL LOW (ref 135–145)
Total Bilirubin: 0.9 mg/dL (ref 0.3–1.2)
Total Protein: 6.2 g/dL — ABNORMAL LOW (ref 6.5–8.1)

## 2022-10-08 LAB — CBC WITH DIFFERENTIAL (CANCER CENTER ONLY)
Abs Immature Granulocytes: 0.01 10*3/uL (ref 0.00–0.07)
Basophils Absolute: 0 10*3/uL (ref 0.0–0.1)
Basophils Relative: 0 %
Eosinophils Absolute: 0 10*3/uL (ref 0.0–0.5)
Eosinophils Relative: 0 %
HCT: 20.8 % — ABNORMAL LOW (ref 36.0–46.0)
Hemoglobin: 7.6 g/dL — ABNORMAL LOW (ref 12.0–15.0)
Immature Granulocytes: 1 %
Lymphocytes Relative: 11 %
Lymphs Abs: 0.1 10*3/uL — ABNORMAL LOW (ref 0.7–4.0)
MCH: 31.9 pg (ref 26.0–34.0)
MCHC: 36.5 g/dL — ABNORMAL HIGH (ref 30.0–36.0)
MCV: 87.4 fL (ref 80.0–100.0)
Monocytes Absolute: 0.2 10*3/uL (ref 0.1–1.0)
Monocytes Relative: 19 %
Neutro Abs: 0.8 10*3/uL — ABNORMAL LOW (ref 1.7–7.7)
Neutrophils Relative %: 69 %
Platelet Count: 70 10*3/uL — ABNORMAL LOW (ref 150–400)
RBC: 2.38 MIL/uL — ABNORMAL LOW (ref 3.87–5.11)
RDW: 11.8 % (ref 11.5–15.5)
WBC Count: 1.1 10*3/uL — ABNORMAL LOW (ref 4.0–10.5)
WBC Morphology: INCREASED
nRBC: 0 % (ref 0.0–0.2)

## 2022-10-08 LAB — RAD ONC ARIA SESSION SUMMARY
Course Elapsed Days: 49
Plan Fractions Treated to Date: 35
Plan Prescribed Dose Per Fraction: 2 Gy
Plan Total Fractions Prescribed: 35
Plan Total Prescribed Dose: 70 Gy
Reference Point Dosage Given to Date: 70 Gy
Reference Point Session Dosage Given: 2 Gy
Session Number: 35

## 2022-10-08 LAB — MAGNESIUM: Magnesium: 1.1 mg/dL — ABNORMAL LOW (ref 1.7–2.4)

## 2022-10-08 MED ORDER — HEPARIN SOD (PORK) LOCK FLUSH 100 UNIT/ML IV SOLN
500.0000 [IU] | Freq: Once | INTRAVENOUS | Status: AC
  Administered 2022-10-08: 500 [IU]

## 2022-10-08 MED ORDER — SODIUM CHLORIDE 0.9 % IV SOLN: Freq: Once | INTRAVENOUS | Status: AC

## 2022-10-08 MED ORDER — ONDANSETRON HCL 4 MG/2ML IJ SOLN: 8.0000 mg | Freq: Once | INTRAMUSCULAR | Status: DC

## 2022-10-08 MED ORDER — SODIUM CHLORIDE 0.9 % IV SOLN: Freq: Once | INTRAVENOUS | Status: DC

## 2022-10-08 MED ORDER — PROCHLORPERAZINE MALEATE 10 MG PO TABS
10.0000 mg | ORAL_TABLET | Freq: Four times a day (QID) | ORAL | 1 refills | Status: DC | PRN
Start: 2022-10-08 — End: 2022-10-12

## 2022-10-08 MED ORDER — SODIUM CHLORIDE 0.9% FLUSH
10.0000 mL | Freq: Once | INTRAVENOUS | Status: AC
  Administered 2022-10-08: 10 mL

## 2022-10-08 MED ORDER — FENTANYL 12 MCG/HR TD PT72
1.0000 | MEDICATED_PATCH | TRANSDERMAL | 0 refills | Status: DC
Start: 2022-10-08 — End: 2022-10-12

## 2022-10-08 MED ORDER — ALTEPLASE 2 MG IJ SOLR
2.0000 mg | Freq: Once | INTRAMUSCULAR | Status: AC
  Administered 2022-10-08: 2 mg

## 2022-10-08 MED ORDER — ONDANSETRON HCL 4 MG/2ML IJ SOLN
8.0000 mg | Freq: Once | INTRAMUSCULAR | Status: AC
  Administered 2022-10-08: 8 mg via INTRAVENOUS

## 2022-10-08 MED ORDER — KATE FARMS STANDARD 1.4 EN LIQD: ENTERAL | 6 refills | Status: DC

## 2022-10-08 NOTE — Progress Notes (Signed)
Lillard Anes, NP aware of lab abnormalities (hemoglobin: 7.6, platelets: 70, ANC: 0.8, potassium: 3.3, and magnesium: 1.1. Patient will be scheduled for replacement electrolytes on Wednesday, 10/10/22. Patient agreeable to change treatment plan.

## 2022-10-08 NOTE — Patient Instructions (Addendum)
Hypomagnesemia Hypomagnesemia is a condition in which the level of magnesium in the blood is too low. Magnesium is a mineral that is found in many foods. It is used in many different processes in the body. Hypomagnesemia can affect every organ in the body. In severe cases, it can cause life-threatening problems. What are the causes? This condition may be caused by: Not getting enough magnesium in your diet or not having enough healthy foods to eat (malnutrition). Problems with magnesium absorption in the intestines. Dehydration. Excessive use of alcohol. Vomiting. Severe or long-term (chronic) diarrhea. Some medicines, including medicines that make you urinate more often (diuretics). Certain diseases, such as kidney disease, diabetes, celiac disease, and overactive thyroid. What are the signs or symptoms? Symptoms of this condition include: Loss of appetite, nausea, and vomiting. Involuntary shaking or trembling of a body part (tremor). Muscle weakness or tingling in the arms and legs. Sudden tightening of muscles (muscle spasms). Confusion. Psychiatric issues, such as: Depression and irritability. Psychosis. A feeling of fluttering of the heart (palpitations). Seizures. These symptoms are more severe if magnesium levels drop suddenly. How is this diagnosed? This condition may be diagnosed based on: Your symptoms and medical history. A physical exam. Blood and urine tests. How is this treated? Treatment depends on the cause and the severity of the condition. It may be treated by: Taking a magnesium supplement. This can be taken in pill form. If the condition is severe, magnesium is usually given through an IV. Making changes to your diet. You may be directed to eat foods that have a lot of magnesium, such as green leafy vegetables, peas, beans, and nuts. Not drinking alcohol. If you are struggling not to drink, ask your health care provider for help. Follow these instructions at  home: Eating and drinking     Make sure that your diet includes foods with magnesium. Foods that have a lot of magnesium in them include: Green leafy vegetables, such as spinach and broccoli. Beans and peas. Nuts and seeds, such as almonds and sunflower seeds. Whole grains, such as whole grain bread and fortified cereals. Drink fluids that contain salts and minerals (electrolytes), such as sports drinks, when you are active. Do not drink alcohol. General instructions Take over-the-counter and prescription medicines only as told by your health care provider. Take magnesium supplements as directed if your health care provider tells you to take them. Have your magnesium levels monitored as told by your health care provider. Keep all follow-up visits. This is important. Contact a health care provider if: You get worse instead of better. Your symptoms return. Get help right away if: You develop severe muscle weakness. You have trouble breathing. You feel that your heart is racing. These symptoms may represent a serious problem that is an emergency. Do not wait to see if the symptoms will go away. Get medical help right away. Call your local emergency services (911 in the [place] Do not drive yourself to the hospital. Summary Hypomagnesemia is a condition in which the level of magnesium in the blood is too low. Hypomagnesemia can affect every organ in the body. Treatment may include eating more foods that contain magnesium, taking magnesium supplements, and not drinking alcohol. Have your magnesium levels monitored as told by your health care provider. This information is not intended to replace advice given to you by your health care provider. Make sure you discuss any questions you have with your health care provider. Document Revised: 08/23/2020 Document Reviewed: 08/23/2020 Elsevier Patient Education  2024 [employer] Hypokalemia Hypokalemia means that the amount of potassium in the  blood is lower than normal. Potassium is a mineral (electrolyte) that helps regulate the amount of fluid in the body. It also stimulates muscle tightening (contraction) and helps nerves work properly. Normally, most of the body's potassium is inside cells, and only a very small amount is in the blood. Because the amount in the blood is so small, minor changes to potassium levels in the blood can be life-threatening. What are the causes? This condition may be caused by: Antibiotic medicine. Diarrhea or vomiting. Taking too much of a medicine that helps you have a bowel movement (laxative) can cause diarrhea and lead to hypokalemia. Chronic kidney disease (CKD). Medicines that help the body get rid of excess fluid (diuretics). Eating disorders, such as anorexia or bulimia. Low magnesium levels in the body. Sweating a lot. What are the signs or symptoms? Symptoms of this condition include: Weakness. Constipation. Fatigue. Muscle cramps. Mental confusion. Skipped heartbeats or irregular heartbeat (palpitations). Tingling or numbness. How is this diagnosed? This condition is diagnosed with a blood test. How is this treated? This condition may be treated by: Taking potassium supplements. Adjusting the medicines that you take. Eating more foods that contain a lot of potassium. If your potassium level is very low, you may need to get potassium through an IV and be monitored in the hospital. Follow these instructions at home: Eating and drinking  Eat a healthy diet. A healthy diet includes fresh fruits and vegetables, whole grains, healthy fats, and lean proteins. If told, eat more foods that contain a lot of potassium. These include: Nuts, such as peanuts and pistachios. Seeds, such as sunflower seeds and pumpkin seeds. Peas, lentils, and lima beans. Whole grain and bran cereals and breads. Fresh fruits and vegetables, such as apricots, avocado, bananas, cantaloupe, kiwi, oranges,  tomatoes, asparagus, and potatoes. Juices, such as orange, tomato, and prune. Lean meats, including fish. Milk and milk products, such as yogurt. General instructions Take over-the-counter and prescription medicines only as told by your health care provider. This includes vitamins, natural food products, and supplements. Keep all follow-up visits. This is important. Contact a health care provider if: You have weakness that gets worse. You feel your heart pounding or racing. You vomit. You have diarrhea. You have diabetes and you have trouble keeping your blood sugar in your target range. Get help right away if: You have chest pain. You have shortness of breath. You have vomiting or diarrhea that lasts for more than 2 days. You faint. These symptoms may be an emergency. Get help right away. Call 911. Do not wait to see if the symptoms will go away. Do not drive yourself to the hospital. Summary Hypokalemia means that the amount of potassium in the blood is lower than normal. This condition is diagnosed with a blood test. Hypokalemia may be treated by taking potassium supplements, adjusting the medicines that you take, or eating more foods that are high in potassium. If your potassium level is very low, you may need to get potassium through an IV and be monitored in the hospital. This information is not intended to replace advice given to you by your health care provider. Make sure you discuss any questions you have with your health care provider. Document Revised: 12/08/2020 Document Reviewed: 12/08/2020 Elsevier Patient Education  2024 [employer] Inc[employer] Dehydration, Adult Dehydration is a condition in which there is not enough water or other fluids in the body. This happens  when a person loses more fluids than they take in. Important organs cannot work right without the right amount of fluids. Any loss of fluids from the body can cause dehydration. Dehydration can be mild, worse, or very  bad. It should be treated right away to keep it from getting very bad. What are the causes? Conditions that cause loss of water in the body. They include: Watery poop (diarrhea). Vomiting. Sweating a lot. Fever. Infection. Peeing (urinating) a lot. Not drinking enough fluids. Certain medicines, such as medicines that take extra fluid out of the body (diuretics). Lack of safe drinking water. Not being able to get enough water and food. What increases the risk? Having a long-term (chronic) illness that has not been treated the right way, such as: Diabetes. Heart disease. Kidney disease. Being 84 years of age or older. Having a disability. Living in a place that is high above the ground or sea (high in altitude). The thinner, drier air causes more fluid loss. Doing exercises that put stress on your body for a long time. Being active when in hot places. What are the signs or symptoms? Symptoms of dehydration depend on how bad it is. Mild or worse dehydration Thirst. Dry lips or dry mouth. Feeling dizzy or light-headed. Muscle cramps. Passing little pee or dark pee. Pee may be the color of tea. Headache. Very bad dehydration Changes in skin. Skin may: Be cold to the touch (clammy). Be blotchy or pale. Not go back to normal right after you pinch it and let it go. Little or no tears, pee, or sweat. Fast breathing. Low blood pressure. Weak pulse. Pulse that is more than 100 beats a minute when you are sitting still. Other changes, such as: Feeling very thirsty. Eyes that look hollow (sunken). Cold hands and feet. Being confused. Being very tired (lethargic) or having trouble waking from sleep. Losing weight. Loss of consciousness. How is this treated? Treatment for this condition depends on how bad your dehydration is. Treatment should start right away. Do not wait until your condition gets very bad. Very bad dehydration is an emergency. You will need to go to a  hospital. Mild or worse dehydration can be treated at home. You may be asked to: Drink more fluids. Drink an oral rehydration solution (ORS). This drink gives you the right amount of fluids, salts, and minerals (electrolytes). Very bad dehydration can be treated: With fluids through an IV tube. By correcting low levels of electrolytes in the body. By treating the problem that caused your dehydration. Follow these instructions at home: Oral rehydration solution If told by your doctor, drink an ORS: Make an ORS. Use instructions on the package. Start by drinking small amounts, about  cup (120 mL) every 5-10 minutes. Slowly drink more until you have had the amount that your doctor said to have.  Eating and drinking  Drink enough clear fluid to keep your pee pale yellow. If you were told to drink an ORS, finish the ORS first. Then, start slowly drinking other clear fluids. Drink fluids such as: Water. Do not drink only water. Doing that can make the salt (sodium) level in your body get too low. Water from ice chips you suck on. Fruit juice that you have added water to (diluted). Low-calorie sports drinks. Eat foods that have the right amounts of salts and minerals, such as bananas, oranges, potatoes, tomatoes, or spinach. Do not drink alcohol. Avoid drinks that have caffeine or sugar. These include:: High-calorie sports drinks. Fruit  juice that you did not add water to. Soda. Coffee or energy drinks. Avoid foods that are greasy or have a lot of fat or sugar. General instructions Take over-the-counter and prescription medicines only as told by your doctor. Do not take sodium tablets. Doing that can make the salt level in your body get too high. Return to your normal activities as told by your doctor. Ask your doctor what activities are safe for you. Keep all follow-up visits. Your doctor may check and change your treatment. Contact a doctor if: You have pain in your belly (abdomen)  and the pain: Gets worse. Stays in one place. You have a rash. You have a stiff neck. You get angry or annoyed more easily than normal. You are more tired or have a harder time waking than normal. You feel weak or dizzy. You feel very thirsty. Get help right away if: You have any symptoms of very bad dehydration. You vomit every time you eat or drink. Your vomiting gets worse, does not go away, or you vomit blood or green stuff. You are getting treatment, but symptoms are getting worse. You have a fever. You have a very bad headache. You have: Diarrhea that gets worse or does not go away. Blood in your poop (stool). This may cause poop to look black and tarry. No pee in 6-8 hours. Only a small amount of pee in 6-8 hours, and the pee is very dark. You have trouble breathing. These symptoms may be an emergency. Get help right away. Call 911. Do not wait to see if the symptoms will go away. Do not drive yourself to the hospital. This information is not intended to replace advice given to you by your health care provider. Make sure you discuss any questions you have with your health care provider. Document Revised: 10/23/2021 Document Reviewed: 10/23/2021 Elsevier Patient Education  2024 [employer].

## 2022-10-08 NOTE — Telephone Encounter (Signed)
Called pt to ask about bowel movements before starting new pain medication. She states her bowel movements are "normal for me" but was unable to give accurate frequency. This info relayed to NP.

## 2022-10-08 NOTE — Progress Notes (Signed)
Per Lillia Abed, NP increased NS to 88ml/hr to finish before pt's radiation appt.

## 2022-10-08 NOTE — Progress Notes (Signed)
Nutrition follow-up completed with patient during IV fluids.  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  She has also completed weekly cisplatin.  Patient is status post PEG on May 14.  She is followed by Dr. Basilio Cairo and Dr. Al Pimple.  Patient weighed today in infusion on a different scale.  Weight was documented at 143.8 pounds. (? Accuracy)  Noted labs on July 1: Sodium 133, potassium 3.3, glucose 103, albumin 3.2.  Estimated nutrition needs: 2115-2400 cal, 92-106 g protein, greater than 2.1 L fluid.  Patient is coughing and spitting up excess saliva.  She has noticed increased pain with swallowing.  States she is taking Hycet as ordered.  She is not taking any food or liquids by mouth.  States she is only using 120 mL(4 ounces) of [employer] 1.4 every day via PEG.  Reports giving same amount of free water daily.  This provides 168 cal.  She reports anytime she tries to increase tube feedings she experiences nausea, vomiting, and increased heartburn.  Suggested trying 120 mL every 4 hours but patient states she will be sick. Reports taking antiemetics and omeprazole as ordered. Husband verbalizes patient is not going to try to eat more.  After discussion with patient and husband, patient is agreeable to try continuous tube feeding to increase nutrition.  She is also agreeable to daily IV fluids.  Will request refeeding labs as well as daily fluids from provider.  Nutrition diagnosis:  Inadequate energy intake related to insufficient enteral nutrition/oral intake as evidenced by patient's self-report of only giving 4 ounces of tube feedings daily.  Intervention: Recommend daily IV fluids. Continue other medications as prescribed. Contact provider to explore increasing reflux medication. Change tube feeding to [facility] 1.4 via PEG at 20 mL/h over 24 hours.  Do not advance until tolerance established.  Patient will need refeeding labs as tube feeding advances. (CMP, phosphorus,  magnesium) Recommended 100 mg thiamine x 5 days. Contact [facility] to deliver pump.  Monitoring, evaluation, goals: Patient will tolerate trickle tube feeding.   If patient tolerating continuous feeding at 20 mL/h for 24 hours, will increase 10 mL every 8 hours to goal of 68 mL/h (5 cartons daily) Give 120 mL free water flushes before and after starting or stopping tube feeding.  Give additional 220 mL free water flushes 4 times a day.  5 cartons of [employer] 1.4 provides 1625 mL / 2275 cal, 100 g protein, 1155 mL free water.  Total free water intake is approximately 2275 mL daily at goal rate.  This is 100% estimated nutrition needs.  Will continue to follow patient closely.  **Disclaimer: This note was dictated with voice recognition software. Similar sounding words can inadvertently be transcribed and this note may contain transcription errors which may not have been corrected upon publication of note.**

## 2022-10-08 NOTE — Progress Notes (Signed)
[facility] Cancer Follow up:    Nichole Craze, NP [address] [place] [address]   DIAGNOSIS:  Cancer Staging  Secondary malignant neoplasm of cervical lymph node (HCC) Staging form: Pharynx - HPV-Mediated Oropharynx, AJCC 8th Edition - Clinical stage from 08/10/2022: Stage I (cT0, cN1, cM0, p16+) - Signed by Lonie Peak, MD on 08/11/2022 Stage prefix: Initial diagnosis   SUMMARY OF ONCOLOGIC HISTORY: Oncology History  Secondary malignant neoplasm of cervical lymph node (HCC)  06/26/2022 Imaging   Neck CT: a 4.4 cm x 3.0 cm x 4.1 cm soft tissue mass centered in the right parapharyngeal space, favored to be arising from the deep lobe of the right parotid gland, 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 The mass appeared to encase a portion of the right external carotid artery and partially encases the right internal carotid artery Prominent right-sided cervical and upper mediastinal  lymph nodes were also appreciated, suspicious for metastatic spread of  disease    07/24/2022 Imaging   MRI of the face and neck  showed the infiltrative mass lesion centered in the right carotid, parapharyngeal and parotid spaces with extensive surrounding local invasion, and findings concerning for metastatic adenopathy in the right neck and additional nonspecific mildly enlarged and rounded lymph nodes involving the upper mediastinum and contralateral left jugular chain. MRI also showed evidence of tumor extension within close proximity to multiple skill base foramina, along with evidence of right hypoglossal nerve involvement contributing to right hemitongue denervation changes, and tumor thrombus within the encased right internal jugular vein. MRI otherwise showed no evidence of intracranial perineural tumor spread.    07/27/2022 Initial Biopsy   FNA of the right neck mass showed carcinoma with squamous features, favoring squamous cell carcinoma (however,  other tumors with squamous differentiation cannot entirely be excluded); P16 diffusely positive; HPV focally positive.    08/02/2022 PET scan   Whole body PET scan: the dominant right level IIa metastatic nodal mass measuring 4.9 x 2.5 cm with an SUV max of 15; multiple ipsilateral nodal metastases involving the right level 3, level 4, and level 5 lymph node stations including the dominant right level 3 nodal metastasis (measuring 1.1 cm) coming in contact with the right oropharynx without discrete primary tumor; FDG avidity within the left palatine tonsil (normal in size and with a max SUV of 4.4); and mildly FDG avid mediastinal, bihilar, and subcarinal lymph nodes. No other sites concerning for metastatic disease were appreciated elsewhere in the body.    08/10/2022 Cancer Staging   Staging form: Pharynx - HPV-Mediated Oropharynx, AJCC 8th Edition - Clinical stage from 08/10/2022: Stage I (cT0, cN1, cM0, p16+) - Signed by Lonie Peak, MD on 08/11/2022 Stage prefix: Initial diagnosis   08/21/2022 Procedure   Port placement and G-tube placement   08/23/2022 - 10/08/2022 Radiation Therapy   Concurrent radiation   08/24/2022 -  Chemotherapy   Patient is on Treatment Plan : HEAD/NECK Cisplatin (40) q7d       CURRENT THERAPY: chemoradiation  INTERVAL HISTORY: Nichole Dominguez 67 y.o. female returns for IV fluids prior to receiving her final radiation dosage today of her treatment.  We held chemotherapy last week due to her thrombocytopenia and neutropenia.  She continues to struggle with difficulty swallowing and tolerating her tube feeds.  She is getting in minimal oral fluids however she has had some mild lower extremity swelling with IV fluids we have given her.  Her weight last week was 139  pounds, today she is 143 pounds.  She has noted increased pain in swallowing.  She has been taking Hycet as prescribed by Dr. Luane School however has not noticed a significant amount of relief.  Her last bowel movement  was today and she is fatigued and tired of feeling so poorly.  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  MEDICAL HISTORY: Past Medical History:  Diagnosis Date   Allergy    GERD (gastroesophageal reflux disease)    Osteopenia 01/04/2020   Plantar fasciitis    Seasonal allergies     SURGICAL HISTORY: Past Surgical History:  Procedure Laterality Date   COLONOSCOPY  2011   IR GASTROSTOMY TUBE MOD SED  08/21/2022   IR IMAGING GUIDED PORT INSERTION  08/21/2022   IR PATIENT EVAL TECH 0-60 MINS  08/31/2022   IR PATIENT EVAL TECH 0-60 MINS  09/07/2022   MOUTH SURGERY  01/2020   implant inserted   TUBAL LIGATION  1992    SOCIAL HISTORY: Social History   Socioeconomic History   Marital status: Married    Spouse name: Not on file   Number of children: Not on file   Years of education: Not on file   Highest education level: Not on file  Occupational History   Not on file  Tobacco Use   Smoking status: Former    Packs/day: 1.00    Years: 24.00    Additional pack years: 0.00    Total pack years: 24.00    Types: Cigarettes    Quit date: 10/10/1996    Years since quitting: 26.0   Smokeless tobacco: Never  Vaping Use   Vaping Use: Never used  Substance and Sexual Activity   Alcohol use: Yes    Alcohol/week: 0.0 standard drinks of alcohol    Comment: 1 shot vodka daily   Drug use: No   Sexual activity: Yes    Partners: Male  Other Topics Concern    Not on file  Social History Narrative   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    Lives with husband   1 daughter in [place], 31 grandchildren   Son died   2 grown stepchildren- both Designer, multimedia, grandchildren   Enjoys Corporate investment banker   Social Determinants of Corporate investment banker Strain: Not on file  Food Insecurity: No Food Insecurity (08/09/2022)   Hunger Vital Sign    Worried About Running Out of Food in the Last Year: Never true    Ran Out of Food in the Last Year: Never true  Transportation Needs: No Transportation Needs (08/09/2022)   PRAPARE - Administrator, Civil Service (Medical): No    Lack of Transportation (Non-Medical): No  Physical Activity: Not on file  Stress: Not on file  Social Connections: Not on file  Intimate Partner Violence: Not At Risk (08/09/2022)   Humiliation, Afraid, Rape, and Kick questionnaire    Fear of Current or Ex-Partner: No    Emotionally Abused: No  Physically Abused: No    Sexually Abused: No    FAMILY HISTORY: Family History  Problem Relation Age of Onset   Diabetes Mother    Heart disease Mother        chf   Hypertension Mother    Cervical cancer Mother    Obesity Mother    Colon polyps Mother    Thyroid cancer Father    Cancer Father        thyroid x2   Heart attack Father    Breast cancer Sister    Lupus Sister    Obesity Maternal Grandmother    Diabetes Maternal Grandmother    Heart disease Maternal Grandmother    Hypertension Maternal Grandmother    Stroke Maternal Grandmother    Diabetes Mellitus II Paternal Grandmother        died from diabetic coma   Cancer Paternal Grandfather        not sure what type   Heart attack Son    Esophageal cancer Neg Hx    Stomach cancer Neg Hx    Rectal cancer Neg Hx    Colon cancer Neg Hx     Review of Systems  Constitutional:  Positive for appetite change and fatigue. Negative for chills, fever and unexpected weight change.  HENT:   Positive for sore  throat and trouble swallowing. Negative for hearing loss and lump/mass.   Eyes:  Negative for eye problems and icterus.  Respiratory:  Negative for chest tightness, cough and shortness of breath.   Cardiovascular:  Negative for chest pain, leg swelling and palpitations.  Gastrointestinal:  Positive for nausea. Negative for abdominal distention, abdominal pain, constipation, diarrhea and vomiting.  Endocrine: Negative for hot flashes.  Genitourinary:  Negative for difficulty urinating.   Musculoskeletal:  Negative for arthralgias.  Skin:  Negative for itching and rash.  Neurological:  Negative for dizziness, extremity weakness, headaches and numbness.  Hematological:  Negative for adenopathy. Does not bruise/bleed easily.  Psychiatric/Behavioral:  Negative for depression. The patient is not nervous/anxious.       PHYSICAL EXAMINATION  See [facility] for vitals from today's visit  Physical Exam Constitutional:      General: She is not in acute distress.    Appearance: Normal appearance. She is ill-appearing (appears that she doesn't feel well, and tired). She is not toxic-appearing.  HENT:     Head: Normocephalic and atraumatic.     Mouth/Throat:     Mouth: Mucous membranes are moist.     Pharynx: Oropharynx is clear. Posterior oropharyngeal erythema present. No oropharyngeal exudate.  Eyes:     General: No scleral icterus. Cardiovascular:     Rate and Rhythm: Normal rate and regular rhythm.     Pulses: Normal pulses.     Heart sounds: Normal heart sounds.  Pulmonary:     Effort: Pulmonary effort is normal.     Breath sounds: Normal breath sounds.  Abdominal:     General: Abdomen is flat. Bowel sounds are normal. There is no distension.     Palpations: Abdomen is soft.     Tenderness: There is no abdominal tenderness.  Musculoskeletal:        General: No swelling.     Cervical back: Neck supple.  Lymphadenopathy:     Cervical: No cervical adenopathy.  Skin:    General: Skin is  warm and dry.     Findings: No rash.  Neurological:     General: No focal deficit present.     Mental  Status: She is alert.  Psychiatric:        Mood and Affect: Mood normal.        Behavior: Behavior normal.     LABORATORY DATA:  CBC    Component Value Date/Time   WBC 1.1 (L) 10/08/2022 0734   WBC 7.7 08/21/2022 1000   RBC 2.38 (L) 10/08/2022 0734   HGB 7.6 (L) 10/08/2022 0734   HCT 20.8 (L) 10/08/2022 0734   PLT 70 (L) 10/08/2022 0734   MCV 87.4 10/08/2022 0734   MCH 31.9 10/08/2022 0734   MCHC 36.5 (H) 10/08/2022 0734   RDW 11.8 10/08/2022 0734   LYMPHSABS 0.1 (L) 10/08/2022 0734   MONOABS 0.2 10/08/2022 0734   EOSABS 0.0 10/08/2022 0734   BASOSABS 0.0 10/08/2022 0734    CMP     Component Value Date/Time   NA 133 (L) 10/08/2022 0734   K 3.3 (L) 10/08/2022 0734   CL 96 (L) 10/08/2022 0734   CO2 25 10/08/2022 0734   GLUCOSE 103 (H) 10/08/2022 0734   BUN 13 10/08/2022 0734   CREATININE 0.83 10/08/2022 0734   CREATININE 1.02 (H) 12/28/2019 0749   CALCIUM 7.9 (L) 10/08/2022 0734   PROT 6.2 (L) 10/08/2022 0734   ALBUMIN 3.2 (L) 10/08/2022 0734   AST 6 (L) 10/08/2022 0734   ALT 10 10/08/2022 0734   ALKPHOS 76 10/08/2022 0734   BILITOT 0.9 10/08/2022 0734   GFRNONAA >60 10/08/2022 0734         ASSESSMENT and THERAPY PLAN:   Secondary malignant neoplasm of cervical lymph node (HCC) Nichole Dominguez is a 67 year old woman with history of stage I squamous cell carcinoma of the cervical lymph node.  She is finishing up concurrent chemoradiation today.  Her last chemotherapy was held for this past week.  Dehydration: She met with Vernell Leep our nutritionist who calculated her intake and recommended IV fluids for her daily.  She will receive this. Hypomagnesemia: Her magnesium level today is 1.1.  We will give her 4 g of IV magnesium to supplement.  Will recheck her magnesium level on Friday.  If her magnesium is 1.3 or less we will supplement with 4 g of IV magnesium if her  magnesium level is 1.4 or higher she will receive 2g of IV magnesium. Hypokalemia: Her potassium level is 3.3.  She will receive 20 mill equivalents of potassium tomorrow and her IV fluids.  Hopeful with the continuous tube feeds she will not require further IV supplementation.  Should her potassium be below 3.5 on Friday, we will recommend 20 mill equivalents of KCL IV supplementation. Nausea and difficulty tolerating tube feeds: Nichole Dominguez is changing her tube feeds to continuous and is getting her connected with the pump.  And hopeful this will improve her oral intake status. Throat pain.  The Hycet is not working.  Dr. Al Pimple and I discussed pain medication for her and I prescribed Fentanyl Patch 12 mcg.  I reviewed this with Nichole Dominguez in detail.  Nichole Dominguez will return daily for IV fluids, and on Friday for labs and IV fluids.  We will see her next week for labs and f/u.      All questions were answered. The patient knows to call the clinic with any problems, questions or concerns. We can certainly see the patient much sooner if necessary.  Total encounter time:40 minutes*in face-to-face visit time, chart review, lab review, care coordination, order entry, and documentation of the encounter time.    Lillard Anes, NP 10/08/22 4:34 PM  Medical Oncology and Hematology [facility] [address] [place], [place] [address] Tel. [phone]    Fax. [phone]  *Total Encounter Time as defined by the Centers for Medicare and Medicaid Services includes, in addition to the face-to-face time of a patient visit (documented in the note above) non-face-to-face time: obtaining and reviewing outside history, ordering and reviewing medications, tests or procedures, care coordination (communications with other health care professionals or caregivers) and documentation in the medical record.  

## 2022-10-08 NOTE — Assessment & Plan Note (Signed)
Nichole Dominguez is a 67 year old woman with history of stage I squamous cell carcinoma of the cervical lymph node.  She is finishing up concurrent chemoradiation today.  Her last chemotherapy was held for this past week.  Dehydration: She met with Nichole Dominguez our nutritionist who calculated her intake and recommended IV fluids for her daily.  She will receive this. Hypomagnesemia: Her magnesium level today is 1.1.  We will give her 4 g of IV magnesium to supplement.  Will recheck her magnesium level on Friday.  If her magnesium is 1.3 or less we will supplement with 4 g of IV magnesium if her magnesium level is 1.4 or higher she will receive 2g of IV magnesium. Hypokalemia: Her potassium level is 3.3.  She will receive 20 mill equivalents of potassium tomorrow and her IV fluids.  Hopeful with the continuous tube feeds she will not require further IV supplementation.  Should her potassium be below 3.5 on Friday, we will recommend 20 mill equivalents of KCL IV supplementation. Nausea and difficulty tolerating tube feeds: Nichole Dominguez is changing her tube feeds to continuous and is getting her connected with the pump.  And hopeful this will improve her oral intake status. Throat pain.  The Hycet is not working.  Dr. Al Pimple and I discussed pain medication for her and I prescribed Fentanyl Patch 12 mcg.  I reviewed this with Nichole Dominguez in detail.  Nichole Dominguez will return daily for IV fluids, and on Friday for labs and IV fluids.  We will see her next week for labs and f/u.

## 2022-10-09 ENCOUNTER — Other Ambulatory Visit: Payer: Self-pay

## 2022-10-09 ENCOUNTER — Telehealth: Payer: Self-pay | Admitting: *Deleted

## 2022-10-09 ENCOUNTER — Inpatient Hospital Stay: Payer: 59 | Admitting: Nutrition

## 2022-10-09 ENCOUNTER — Inpatient Hospital Stay: Payer: 59

## 2022-10-09 ENCOUNTER — Other Ambulatory Visit: Payer: Self-pay | Admitting: *Deleted

## 2022-10-09 ENCOUNTER — Encounter: Payer: Self-pay | Admitting: Hematology and Oncology

## 2022-10-09 ENCOUNTER — Other Ambulatory Visit (HOSPITAL_COMMUNITY): Payer: Self-pay

## 2022-10-09 VITALS — BP 155/81 | HR 102 | Temp 98.9°F | Resp 16

## 2022-10-09 DIAGNOSIS — Z95828 Presence of other vascular implants and grafts: Secondary | ICD-10-CM

## 2022-10-09 DIAGNOSIS — E878 Other disorders of electrolyte and fluid balance, not elsewhere classified: Secondary | ICD-10-CM

## 2022-10-09 DIAGNOSIS — Z79899 Other long term (current) drug therapy: Secondary | ICD-10-CM | POA: Diagnosis not present

## 2022-10-09 DIAGNOSIS — D696 Thrombocytopenia, unspecified: Secondary | ICD-10-CM | POA: Diagnosis not present

## 2022-10-09 DIAGNOSIS — E860 Dehydration: Secondary | ICD-10-CM

## 2022-10-09 DIAGNOSIS — C140 Malignant neoplasm of pharynx, unspecified: Secondary | ICD-10-CM | POA: Diagnosis not present

## 2022-10-09 DIAGNOSIS — E876 Hypokalemia: Secondary | ICD-10-CM

## 2022-10-09 DIAGNOSIS — E041 Nontoxic single thyroid nodule: Secondary | ICD-10-CM

## 2022-10-09 MED ORDER — SODIUM CHLORIDE 0.9 % IV SOLN: Freq: Once | INTRAVENOUS | Status: DC

## 2022-10-09 MED ORDER — SODIUM CHLORIDE 0.9% FLUSH
10.0000 mL | Freq: Once | INTRAVENOUS | Status: AC
  Administered 2022-10-09: 10 mL

## 2022-10-09 MED ORDER — HEPARIN SOD (PORK) LOCK FLUSH 100 UNIT/ML IV SOLN
500.0000 [IU] | Freq: Once | INTRAVENOUS | Status: AC
  Administered 2022-10-09: 500 [IU]

## 2022-10-09 MED ORDER — MAGNESIUM SULFATE 4 GM/100ML IV SOLN
4.0000 g | Freq: Once | INTRAVENOUS | Status: AC
  Administered 2022-10-09: 4 g via INTRAVENOUS
  Filled 2022-10-09: qty 100

## 2022-10-09 MED ORDER — SODIUM CHLORIDE 0.9 % IV SOLN: Freq: Once | INTRAVENOUS | Status: AC

## 2022-10-09 MED ORDER — POTASSIUM CHLORIDE 10 MEQ/100ML IV SOLN
10.0000 meq | INTRAVENOUS | Status: AC
  Administered 2022-10-09 (×2): 10 meq via INTRAVENOUS
  Filled 2022-10-09 (×2): qty 100

## 2022-10-09 NOTE — Progress Notes (Signed)
Oncology Nurse Navigator Documentation   Met with Mr and Mrs. Witthuhn after final RT yesterday to offer support and to celebrate end of radiation treatment.   Provided verbal post-RT guidance: Importance of keeping all follow-up appts, especially those with Nutrition and SLP. Importance of protecting treatment area from sun. Continuation of Sonafine application 2-3 times daily, application of antibiotic ointment to areas of raw skin; when supply of Sonafine exhausted transition to OTC lotion with vitamin E. She will receive IVFs this week for support.  Nutrition is working with her to arrange continuous tube feeds at home to help with PEG intake.   Explained my role as navigator will continue for several more months, encouraged him to call me with needs/concerns.    [name] RN, BSN, OCN Head & Neck Oncology Nurse Navigator  [facility] Phone # [phone]  Fax # [phone]

## 2022-10-09 NOTE — Radiation Completion Notes (Signed)
Patient Name: KERSTEN, BRIGHTON MRN: [id number] Date of Birth: [birth year] Referring Physician: [name], M.D. Date of Service: 2022-10-09 Radiation Oncologist: Lonie Peak, M.D. [facility] [place]                             RADIATION ONCOLOGY END OF TREATMENT NOTE     Diagnosis: C77.0 Secondary and unspecified malignant neoplasm of lymph nodes of head, face and neck Staging on 2022-08-10: Secondary malignant neoplasm of cervical lymph node (HCC) T=cT0, N=cN1, M=cM0 Intent: Curative     ==========DELIVERED PLANS==========  First Treatment Date: 2022-08-20 - Last Treatment Date: 2022-10-08   Plan Name: HN_Pharynx Site: Pharynx Technique: IMRT Mode: Photon Dose Per Fraction: 2 Gy Prescribed Dose (Delivered / Prescribed): 70 Gy / 70 Gy Prescribed Fxs (Delivered / Prescribed): 35 / 35     ==========ON TREATMENT VISIT DATES========== 2022-08-20, 2022-08-27, 2022-09-04, 2022-09-12, 2022-09-24, 2022-10-01, 2022-10-08     ==========UPCOMING VISITS==========       ==========APPENDIX - ON TREATMENT VISIT NOTES==========   See weekly On Treatment Notes in Epic for details.

## 2022-10-09 NOTE — Telephone Encounter (Signed)
This RN returned call to pharmacy per their message wanting to verify order for fentanyl patches.  Informed pharmacy closed for lunch- per second call approximately 20 minutes later- was put directly into VM.  Detailed message left stating dosage of patch- pt's history of use of hydrocodone as well as diagnosis and status of inability to swallow secondary to cancer and treatment sequela .  This RN's name and direct desk number given for return call.

## 2022-10-09 NOTE — Progress Notes (Signed)
Nutrition follow-up completed with patient and husband during IV fluids.  Patient received continuous feeding pump with backpack yesterday.  She and her husband both watched a video instructing them on how to use the pump.  They have brought the pump today and have requested help in setting up the pump.  Sue Lush, RN, and I walked patient through priming and setting continuous feeding pump to 20 mL an hour.  We demonstrated how to put continuous feeding pump into the backpack so patient could wear if she wanted to move around.  Patient able to flush feeding tube with water prior to start of feeding.  Educated patient and husband to leave rate at 20 mL an hour until RD sees patient tomorrow.  Explained it would take approximately 16 hours to infuse 325 mL of [employer] 1.4 (1 carton).  Educated to monitor hang time and be sure to use room temperature formula.  Patient should remain upright at least 30 degrees during continuous infusion.  Continue to take nausea and pain medication as prescribed.  Plan will include increasing continuous feeding of [employer] 1.4 at 10 mL every 8 hours to goal of 68 mL/h over 24 hours.  Flush PEG with 120 mL free water flushes before and after starting or stopping tube feeding.  Give an additional 220 mL free water flush 4 times a day.  Refeeding labs are scheduled.  5 cartons of [employer] 1.4 provides 1625 mL / 2275 cal, 100 g protein, 1155 mL of free water.  Total free water intake is approximately 2275 mL daily with the addition of flushes.  This is 100% of estimated nutrition needs.  RD follow-up is scheduled for tomorrow during IV fluids.  **Disclaimer: This note was dictated with voice recognition software. Similar sounding words can inadvertently be transcribed and this note may contain transcription errors which may not have been corrected upon publication of note.**

## 2022-10-09 NOTE — Patient Instructions (Signed)

## 2022-10-10 ENCOUNTER — Telehealth: Payer: Self-pay | Admitting: Adult Health

## 2022-10-10 ENCOUNTER — Inpatient Hospital Stay: Payer: 59

## 2022-10-10 ENCOUNTER — Other Ambulatory Visit: Payer: Self-pay | Admitting: *Deleted

## 2022-10-10 ENCOUNTER — Inpatient Hospital Stay: Payer: 59 | Admitting: Dietician

## 2022-10-10 VITALS — BP 136/69 | HR 101 | Temp 98.2°F | Resp 18

## 2022-10-10 DIAGNOSIS — E860 Dehydration: Secondary | ICD-10-CM

## 2022-10-10 DIAGNOSIS — E878 Other disorders of electrolyte and fluid balance, not elsewhere classified: Secondary | ICD-10-CM

## 2022-10-10 DIAGNOSIS — Z79899 Other long term (current) drug therapy: Secondary | ICD-10-CM | POA: Diagnosis not present

## 2022-10-10 DIAGNOSIS — D696 Thrombocytopenia, unspecified: Secondary | ICD-10-CM | POA: Diagnosis not present

## 2022-10-10 DIAGNOSIS — C770 Secondary and unspecified malignant neoplasm of lymph nodes of head, face and neck: Secondary | ICD-10-CM

## 2022-10-10 DIAGNOSIS — Z95828 Presence of other vascular implants and grafts: Secondary | ICD-10-CM

## 2022-10-10 DIAGNOSIS — E041 Nontoxic single thyroid nodule: Secondary | ICD-10-CM

## 2022-10-10 DIAGNOSIS — C140 Malignant neoplasm of pharynx, unspecified: Secondary | ICD-10-CM | POA: Diagnosis not present

## 2022-10-10 LAB — CBC WITH DIFFERENTIAL (CANCER CENTER ONLY)
Abs Immature Granulocytes: 0.01 10*3/uL (ref 0.00–0.07)
Basophils Absolute: 0 10*3/uL (ref 0.0–0.1)
Basophils Relative: 1 %
Eosinophils Absolute: 0 10*3/uL (ref 0.0–0.5)
Eosinophils Relative: 0 %
HCT: 19 % — ABNORMAL LOW (ref 36.0–46.0)
Hemoglobin: 7 g/dL — ABNORMAL LOW (ref 12.0–15.0)
Immature Granulocytes: 1 %
Lymphocytes Relative: 19 %
Lymphs Abs: 0.2 10*3/uL — ABNORMAL LOW (ref 0.7–4.0)
MCH: 32.4 pg (ref 26.0–34.0)
MCHC: 36.8 g/dL — ABNORMAL HIGH (ref 30.0–36.0)
MCV: 88 fL (ref 80.0–100.0)
Monocytes Absolute: 0.2 10*3/uL (ref 0.1–1.0)
Monocytes Relative: 17 %
Neutro Abs: 0.8 10*3/uL — ABNORMAL LOW (ref 1.7–7.7)
Neutrophils Relative %: 62 %
Platelet Count: 81 10*3/uL — ABNORMAL LOW (ref 150–400)
RBC: 2.16 MIL/uL — ABNORMAL LOW (ref 3.87–5.11)
RDW: 12.1 % (ref 11.5–15.5)
WBC Count: 1.2 10*3/uL — ABNORMAL LOW (ref 4.0–10.5)
nRBC: 0 % (ref 0.0–0.2)

## 2022-10-10 LAB — CMP (CANCER CENTER ONLY)
ALT: 9 U/L (ref 0–44)
AST: 5 U/L — ABNORMAL LOW (ref 15–41)
Albumin: 2.9 g/dL — ABNORMAL LOW (ref 3.5–5.0)
Alkaline Phosphatase: 79 U/L (ref 38–126)
Anion gap: 7 (ref 5–15)
BUN: 14 mg/dL (ref 8–23)
CO2: 27 mmol/L (ref 22–32)
Calcium: 8.3 mg/dL — ABNORMAL LOW (ref 8.9–10.3)
Chloride: 101 mmol/L (ref 98–111)
Creatinine: 0.81 mg/dL (ref 0.44–1.00)
GFR, Estimated: >60 mL/min (ref >60)
Glucose, Bld: 119 mg/dL — ABNORMAL HIGH (ref 70–99)
Potassium: 3.6 mmol/L (ref 3.5–5.1)
Sodium: 135 mmol/L (ref 135–145)
Total Bilirubin: 0.4 mg/dL (ref 0.3–1.2)
Total Protein: 5.2 g/dL — ABNORMAL LOW (ref 6.5–8.1)

## 2022-10-10 LAB — MAGNESIUM: Magnesium: 1.6 mg/dL — ABNORMAL LOW (ref 1.7–2.4)

## 2022-10-10 LAB — PHOSPHORUS: Phosphorus: 2.8 mg/dL (ref 2.5–4.6)

## 2022-10-10 MED ORDER — MORPHINE SULFATE (PF) 2 MG/ML IV SOLN
2.0000 mg | Freq: Once | INTRAVENOUS | Status: AC
  Administered 2022-10-10: 2 mg via INTRAVENOUS
  Filled 2022-10-10: qty 1

## 2022-10-10 MED ORDER — SODIUM CHLORIDE 0.9% FLUSH
10.0000 mL | Freq: Once | INTRAVENOUS | Status: AC
  Administered 2022-10-10: 10 mL

## 2022-10-10 MED ORDER — SODIUM CHLORIDE 0.9 % IV SOLN: INTRAVENOUS | Status: DC

## 2022-10-10 NOTE — Telephone Encounter (Signed)
Scheduled appointment per 7/1 los. Left voicemail.

## 2022-10-10 NOTE — Progress Notes (Signed)
Nutrition Follow-up:  Patient with supraglottic cancer. She has completed concurrent chemoradiation with weekly cisplatin x 5 cycles (Final RT 7/1). Patient is currently receiving supportive care with IV fluids  Met with patient and husband in infusion. She reports tolerating continuous TF at 20 ml/hr. Assisted patient with increasing rate on pump to 30 ml/hr. Patient saliva is thick. She has started taking robitussin DM as well as Benadryl per Dr. Basilio Cairo to help with excess secretions. Patient reports sleeping well overnight with Benadryl. Patient is complaining of sore throat. Swallowing "is the worst." She is taking small sips of water orally. Patient is providing 120 ml water QID with medications. Patient denies nausea, vomiting, diarrhea, constipation.   Medications: reviewed   Labs: Mg 1.6 (trending up), Hgb 7.0  K/Phos - WNL  Anthropometrics: Wt 143 lb 12.8 oz on 7/1  Estimated Energy Needs  Kcals: 2115-2400 Protein: 92-106 Fluid: >2.1 L  NUTRITION DIAGNOSIS: Predicted suboptimal intake continues - TF advancing towards goal   INTERVENTION:  Continue slow titration of tube feedings. Patient will increase tube feeding to 40 ml/hr on 7/4 - If tolerating, plan to increase to goal (noted below) on 7/5 pending labs   [facility] 1.4 @ 68 ml/hr provides 1625 ml/day, 2275 kcal, 100 g protein, 1155 ml free water  MONITORING, EVALUATION, GOAL: weight trends, intake   NEXT VISIT: Friday July 5 during infusion

## 2022-10-12 ENCOUNTER — Other Ambulatory Visit (HOSPITAL_COMMUNITY): Payer: Self-pay

## 2022-10-12 ENCOUNTER — Inpatient Hospital Stay (HOSPITAL_BASED_OUTPATIENT_CLINIC_OR_DEPARTMENT_OTHER): Payer: 59 | Admitting: Hematology and Oncology

## 2022-10-12 ENCOUNTER — Encounter: Payer: Self-pay | Admitting: Hematology and Oncology

## 2022-10-12 ENCOUNTER — Inpatient Hospital Stay: Payer: 59 | Admitting: Dietician

## 2022-10-12 ENCOUNTER — Other Ambulatory Visit: Payer: Self-pay | Admitting: Hematology and Oncology

## 2022-10-12 ENCOUNTER — Inpatient Hospital Stay: Payer: 59

## 2022-10-12 ENCOUNTER — Other Ambulatory Visit: Payer: Self-pay

## 2022-10-12 VITALS — BP 170/89 | HR 110 | Temp 99.0°F | Resp 18 | Wt 152.4 lb

## 2022-10-12 DIAGNOSIS — D61818 Other pancytopenia: Secondary | ICD-10-CM

## 2022-10-12 DIAGNOSIS — E041 Nontoxic single thyroid nodule: Secondary | ICD-10-CM

## 2022-10-12 DIAGNOSIS — C770 Secondary and unspecified malignant neoplasm of lymph nodes of head, face and neck: Secondary | ICD-10-CM

## 2022-10-12 DIAGNOSIS — E860 Dehydration: Secondary | ICD-10-CM

## 2022-10-12 DIAGNOSIS — E43 Unspecified severe protein-calorie malnutrition: Secondary | ICD-10-CM | POA: Insufficient documentation

## 2022-10-12 DIAGNOSIS — Z79899 Other long term (current) drug therapy: Secondary | ICD-10-CM | POA: Diagnosis not present

## 2022-10-12 DIAGNOSIS — E878 Other disorders of electrolyte and fluid balance, not elsewhere classified: Secondary | ICD-10-CM

## 2022-10-12 DIAGNOSIS — G893 Neoplasm related pain (acute) (chronic): Secondary | ICD-10-CM | POA: Diagnosis not present

## 2022-10-12 DIAGNOSIS — K5909 Other constipation: Secondary | ICD-10-CM | POA: Insufficient documentation

## 2022-10-12 DIAGNOSIS — R600 Localized edema: Secondary | ICD-10-CM

## 2022-10-12 DIAGNOSIS — C140 Malignant neoplasm of pharynx, unspecified: Secondary | ICD-10-CM | POA: Diagnosis not present

## 2022-10-12 DIAGNOSIS — D696 Thrombocytopenia, unspecified: Secondary | ICD-10-CM | POA: Diagnosis not present

## 2022-10-12 LAB — MAGNESIUM: Magnesium: 1.3 mg/dL — ABNORMAL LOW (ref 1.7–2.4)

## 2022-10-12 LAB — PREPARE RBC (CROSSMATCH)

## 2022-10-12 LAB — CMP (CANCER CENTER ONLY)
ALT: 8 U/L (ref 0–44)
AST: 5 U/L — ABNORMAL LOW (ref 15–41)
Albumin: 2.9 g/dL — ABNORMAL LOW (ref 3.5–5.0)
Alkaline Phosphatase: 85 U/L (ref 38–126)
Anion gap: 8 (ref 5–15)
BUN: 18 mg/dL (ref 8–23)
CO2: 26 mmol/L (ref 22–32)
Calcium: 8.1 mg/dL — ABNORMAL LOW (ref 8.9–10.3)
Chloride: 102 mmol/L (ref 98–111)
Creatinine: 0.77 mg/dL (ref 0.44–1.00)
GFR, Estimated: >60 mL/min (ref >60)
Glucose, Bld: 113 mg/dL — ABNORMAL HIGH (ref 70–99)
Potassium: 3.8 mmol/L (ref 3.5–5.1)
Sodium: 136 mmol/L (ref 135–145)
Total Bilirubin: 0.3 mg/dL (ref 0.3–1.2)
Total Protein: 5.2 g/dL — ABNORMAL LOW (ref 6.5–8.1)

## 2022-10-12 LAB — CBC WITH DIFFERENTIAL (CANCER CENTER ONLY)
Abs Immature Granulocytes: 0.04 10*3/uL (ref 0.00–0.07)
Basophils Absolute: 0 10*3/uL (ref 0.0–0.1)
Basophils Relative: 1 %
Eosinophils Absolute: 0 10*3/uL (ref 0.0–0.5)
Eosinophils Relative: 1 %
HCT: 19.5 % — ABNORMAL LOW (ref 36.0–46.0)
Hemoglobin: 7 g/dL — ABNORMAL LOW (ref 12.0–15.0)
Immature Granulocytes: 3 %
Lymphocytes Relative: 25 %
Lymphs Abs: 0.3 10*3/uL — ABNORMAL LOW (ref 0.7–4.0)
MCH: 32.3 pg (ref 26.0–34.0)
MCHC: 35.9 g/dL (ref 30.0–36.0)
MCV: 89.9 fL (ref 80.0–100.0)
Monocytes Absolute: 0.2 10*3/uL (ref 0.1–1.0)
Monocytes Relative: 16 %
Neutro Abs: 0.6 10*3/uL — ABNORMAL LOW (ref 1.7–7.7)
Neutrophils Relative %: 54 %
Platelet Count: 107 10*3/uL — ABNORMAL LOW (ref 150–400)
RBC: 2.17 MIL/uL — ABNORMAL LOW (ref 3.87–5.11)
RDW: 12.4 % (ref 11.5–15.5)
Smear Review: NORMAL
WBC Count: 1.2 10*3/uL — ABNORMAL LOW (ref 4.0–10.5)
nRBC: 0 % (ref 0.0–0.2)

## 2022-10-12 LAB — TYPE AND SCREEN
ABO/RH(D): A POS
Antibody Screen: NEGATIVE

## 2022-10-12 LAB — PHOSPHORUS: Phosphorus: 3.2 mg/dL (ref 2.5–4.6)

## 2022-10-12 LAB — BPAM RBC

## 2022-10-12 LAB — ABO/RH: ABO/RH(D): A POS

## 2022-10-12 MED ORDER — MORPHINE SULFATE (CONCENTRATE) 20 MG/ML PO SOLN
20.0000 mg | ORAL | 0 refills | Status: DC | PRN
Start: 2022-10-12 — End: 2022-12-19
  Filled 2022-10-12: qty 180, 30d supply, fill #0

## 2022-10-12 MED ORDER — FENTANYL 12 MCG/HR TD PT72
2.00 | MEDICATED_PATCH | TRANSDERMAL | 0 refills | Status: AC
Start: 2022-10-12 — End: ?

## 2022-10-12 MED ORDER — SODIUM CHLORIDE 0.9 % IV SOLN: INTRAVENOUS | Status: AC

## 2022-10-12 NOTE — Assessment & Plan Note (Signed)
She has poorly controlled pain I recommend increasing fentanyl patch to 2 patches daily until her next evaluation next week I also recommend discontinuation of Hycet and replacing it with liquid morphine concentrate We discussed risk of constipation

## 2022-10-12 NOTE — Assessment & Plan Note (Signed)
We will try to give her IV magnesium tomorrow

## 2022-10-12 NOTE — Assessment & Plan Note (Signed)
This will impact on wound healing I recommend blood transfusion support We discussed some of the risks, benefits, and alternatives of blood transfusions. The patient is symptomatic from anemia and the hemoglobin level is critically low.  Some of the side-effects to be expected including risks of transfusion reactions, chills, infection, syndrome of volume overload and risk of hospitalization from various reasons and the patient is willing to proceed and went ahead to sign consent today. She will receive a unit of blood tomorrow

## 2022-10-12 NOTE — Progress Notes (Signed)
Ms. 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 She completed treatment on 10-08-22.  Pain issues, if any: Denies any mouth or throat, and reports she has weaned down from 2 fentanyl patches to 1.  Using a feeding tube?: Yes--continues to tolerate continuous tube feeds, but does report continued nausea and reflux Weight changes, if any:  Wt Readings from Last 3 Encounters:  10/26/22 141 lb 4 oz (64.1 kg)  10/22/22 142 lb 12.8 oz (64.8 kg)  10/15/22 151 lb 12.8 oz (68.9 kg)   Swallowing issues, if any: Denies, but currently only taking medications and small amounts of liquids by mouth. She declined further SLP visits per last note (10/22/2022) Smoking or chewing tobacco? None Using fluoride toothpaste daily? Yes--denies any dental concerns or mouth sores Last ENT visit was on: Not since diagnosis Other notable issues, if any: Denies any jaw or ear pain, or difficulty opening her mouth. Denies any swelling or new lumps/bumps under her chin or down her neck. Skin still remains tanned in treatment area with a few areas of dry peeling.

## 2022-10-12 NOTE — Progress Notes (Signed)
[facility] OFFICE PROGRESS NOTE  Patient Care Team: [name], NP as PCP - General (Internal Medicine) Malmfelt, Lise Auer, RN as Oncology Nurse Navigator Lonie Peak, MD as Consulting Physician (Radiation Oncology) Rachel Moulds, MD as Consulting Physician (Hematology and Oncology) Efrain Sella, MD as Referring Physician (Otolaryngology) Laren Boom, DO as Consulting Physician (Otolaryngology)  ASSESSMENT & PLAN:  Secondary malignant neoplasm of cervical lymph node [facility]) She has just completed chemoradiation therapy, complicated by severe pancytopenia, malnutrition, uncontrolled pain, constipation, electrolyte imbalance and others We will continue aggressive supportive care  Cancer associated pain She has poorly controlled pain I recommend increasing fentanyl patch to 2 patches daily until her next evaluation next week I also recommend discontinuation of Hycet and replacing it with liquid morphine concentrate We discussed risk of constipation  Other constipation I recommend daily MiraLAX  Severe protein-calorie malnutrition (HCC) She will continue tube feeds as directed by nutritionist We will try to arrange for electrolyte replacement therapy  Hypomagnesemia We will try to give her IV magnesium tomorrow  Pancytopenia, acquired (HCC) This will impact on wound healing I recommend blood transfusion support We discussed some of the risks, benefits, and alternatives of blood transfusions. The patient is symptomatic from anemia and the hemoglobin level is critically low.  Some of the side-effects to be expected including risks of transfusion reactions, chills, infection, syndrome of volume overload and risk of hospitalization from various reasons and the patient is willing to proceed and went ahead to sign consent today. She will receive a unit of blood tomorrow  Lower extremity edema Due to poor mobility and third spacing Observe  only I do not recommend furosemide or diuretic therapy as it can exacerbate dehydration  Orders Placed This Encounter  Procedures   Informed Consent Details: Physician/Practitioner Attestation; Transcribe to consent form and obtain patient signature    Standing Status:   Future    Standing Expiration Date:   10/12/2023    Order Specific Question:   Physician/Practitioner attestation of informed consent for blood and or blood product transfusion    Answer:   I, the physician/practitioner, attest that I have discussed with the patient the benefits, risks, side effects, alternatives, likelihood of achieving goals and potential problems during recovery for the procedure that I have provided informed consent.    Order Specific Question:   Product(s)    Answer:   All Product(s)   Care order/instruction    Transfuse Parameters    Standing Status:   Future    Standing Expiration Date:   10/12/2023   ABO/Rh    Standing Status:   Future    Number of Occurrences:   1    Standing Expiration Date:   10/12/2023   Sample to Blood Bank    Standing Status:   Standing    Number of Occurrences:   33    Standing Expiration Date:   10/12/2023   Type and screen         Standing Status:   Future    Number of Occurrences:   1    Standing Expiration Date:   10/12/2023   Prepare RBC (crossmatch)    Standing Status:   Standing    Number of Occurrences:   1    Order Specific Question:   # of Units    Answer:   1 unit    Order Specific Question:   Transfusion Indications    Answer:   Hemoglobin < 7 gm/dL and symptomatic  Order Specific Question:   Special Requirements    Answer:   Irradiated-IRR    Order Specific Question:   Number of Units to Keep Ahead    Answer:   NO units ahead    Order Specific Question:   Instructions:    Answer:   Transfuse    Order Specific Question:   If emergent release call blood bank    Answer:   Not emergent release    All questions were answered. The patient knows to call the  clinic with any problems, questions or concerns. The total time spent in the appointment was 40 minutes encounter with patients including review of chart and various tests results, discussions about plan of care and coordination of care plan   Artis Delay, MD 10/12/2022 2:14 PM  INTERVAL HISTORY: Please see below for problem oriented charting. she returns for urgent evaluation The patient was scheduled for IV fluids today She has multiple issues including uncontrolled pain, severe pancytopenia, electrolyte imbalance and others.  I have reviewed her chart extensively She has just completed chemoradiation therapy. She has been getting IV fluid support. She was started on fentanyl patch this week but find that unhelpful.  She was also on Hycet and that was unhelpful. We spent a lot of time addressing pain management.  The majority the site of her pain is mucositis and sore throat She has very poor oral intake and is currently started on tube feeds at 40 mL/h in addition to IV fluid support She denies fever or chills She is noted to be tachycardic I reviewed test results with the patient and her husband and discussed additional support as above She has chronic constipation  REVIEW OF SYSTEMS:   Constitutional: Denies fevers, chills or abnormal weight loss Eyes: Denies blurriness of vision Ears, nose, mouth, throat, and face: Denies mucositis or sore throat Respiratory: Denies cough, dyspnea or wheezes Cardiovascular: Denies palpitation, chest discomfort Lymphatics: Denies new lymphadenopathy or easy bruising Neurological:Denies numbness, tingling or new weaknesses Behavioral/Psych: Mood is stable, no new changes  All other systems were reviewed with the patient and are negative.  I have reviewed the past medical history, past surgical history, social history and family history with the patient and they are unchanged from previous note.  ALLERGIES:  is allergic to amlodipine, darvon  [propoxyphene], and sudafed [pseudoephedrine hcl].  MEDICATIONS:  Current Outpatient Medications  Medication Sig Dispense Refill   morphine (ROXANOL) 20 MG/ML concentrated solution Take 1 mL by mouth every 4 hours as needed for severe pain. 240 mL 0   acetaminophen (TYLENOL) 500 MG tablet Take 1,500 mg by mouth every 6 (six) hours as needed.     augmented betamethasone dipropionate (DIPROLENE-AF) 0.05 % ointment Apply topically 2 (two) times daily. 15 g 0   Calcium Carbonate (CALCIUM 600 HIGH POTENCY PO) Take 3 capsules by mouth daily.     cholecalciferol (VITAMIN D3) 25 MCG (1000 UNIT) tablet Take 1,000 Units by mouth daily. (Patient not taking: Reported on 08/09/2022)     fentaNYL (DURAGESIC) 12 MCG/HR Place 2 patches onto the skin every 3 (three) days. 5 patch 0   fluticasone (FLONASE) 50 MCG/ACT nasal spray USE 2 SPRAYS IN EACH NOSTRIL DAILY 48 g 3   lidocaine-prilocaine (EMLA) cream Apply to affected area once 30 g 3   losartan (COZAAR) 50 MG tablet Take 1 tablet (50 mg total) by mouth daily. 90 tablet 1   Multiple Vitamins-Minerals (MULTIVITAMIN WITH MINERALS) tablet Take 1 tablet by mouth daily. (Patient  not taking: Reported on 08/09/2022)     Nutritional Supplements (KATE [employer]ARMS STANDARD 1.4) LIQD Change tube feeding to [facility] 1.4 via PEG at 20 mL an hour continuously.  If tolerated for 24 hours, increase 10 mL every 8 hours to goal rate of 68 mL/h over 24 hours.  Flush feeding tube with 120 mL free water before and after starting and stopping continuous feedings.  In addition give 220 mL free water 4 times a day.  Provides 2275 cal, 100 g protein, 2275 mL free water/100% estimated needs.  Send pump and 2 cases of formula. 1625 mL 6   omeprazole (PRILOSEC) 20 MG capsule TAKE 1 CAPSULE (20 MG TOTAL) BY MOUTH EVERY MORNING. 90 capsule 1   ondansetron (ZOFRAN-ODT) 8 MG disintegrating tablet Take 1 tablet (8 mg total) by mouth every 8 (eight) hours as needed for nausea or vomiting. 20 tablet 0    prochlorperazine (COMPAZINE) 10 MG tablet Take 1 tablet (10 mg total) by mouth every 6 (six) hours as needed (Nausea or vomiting). 30 tablet 1   No current facility-administered medications for this visit.    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 The mass appeared to encase a portion of the right external carotid artery and partially encases the right internal carotid artery Prominent right-sided cervical and upper mediastinal  lymph nodes were also appreciated, suspicious for metastatic spread of  disease    07/24/2022 Imaging   MRI of the face and neck  showed the infiltrative mass lesion centered in the right carotid, parapharyngeal and parotid spaces with extensive surrounding local invasion, and findings concerning for metastatic adenopathy in the right neck and additional nonspecific mildly enlarged and rounded lymph nodes involving the upper mediastinum and contralateral left jugular chain. MRI also showed evidence of tumor extension within close proximity to multiple skill base foramina, along with evidence of right hypoglossal nerve involvement contributing to right hemitongue denervation changes, and tumor thrombus within the encased right internal jugular vein. MRI otherwise showed no evidence of intracranial perineural tumor spread.    07/27/2022 Initial Biopsy   FNA of the right neck mass showed carcinoma with squamous features, favoring squamous cell carcinoma (however, other tumors with squamous differentiation cannot entirely be excluded); P16 diffusely positive; HPV focally positive.    08/02/2022 PET scan   Whole body PET scan: the dominant right level IIa metastatic nodal mass  measuring 4.9 x 2.5 cm with an SUV max of 15; multiple ipsilateral nodal metastases involving the right level 3, level 4, and level 5 lymph node stations including the dominant right level 3 nodal metastasis (measuring 1.1 cm) coming in contact with the right oropharynx without discrete primary tumor; FDG avidity within the left palatine tonsil (normal in size and with a max SUV of 4.4); and mildly FDG avid mediastinal, bihilar, and subcarinal lymph nodes. No other sites concerning for metastatic disease were appreciated elsewhere in the body.    08/10/2022 Cancer Staging   Staging form: Pharynx - HPV-Mediated Oropharynx, AJCC 8th Edition - Clinical stage from 08/10/2022: Stage I (cT0, cN1, cM0, p16+) - Signed by Lonie Peak, MD on 08/11/2022 Stage prefix: Initial diagnosis   08/21/2022 Procedure  Port placement and G-tube placement   08/23/2022 - 10/08/2022 Radiation Therapy   Concurrent radiation   08/24/2022 -  Chemotherapy   Patient is on Treatment Plan : HEAD/NECK Cisplatin (40) q7d       PHYSICAL EXAMINATION: ECOG PERFORMANCE STATUS: 2 - Symptomatic, <50% confined to bed  Vitals:   10/12/22 1012  BP: (!) 170/89  Pulse: (!) 110  Resp: 18  Temp: 99 F (37.2 C)  SpO2: 99%   Filed Weights   10/12/22 1012  Weight: 152 lb 6.4 oz (69.1 kg)    GENERAL:alert, no distress and comfortable EYES: normal, Conjunctiva are pink and non-injected, sclera clear OROPHARYNX significant mucositis.  No thrush NECK: Significant skin ulceration from radiation NEURO: alert & oriented x 3 with fluent speech, no focal motor/sensory deficits Noted bilateral lower extremity edema  LABORATORY DATA:  I have reviewed the data as listed    Component Value Date/Time   NA 136 10/12/2022 1100   K 3.8 10/12/2022 1100   CL 102 10/12/2022 1100   CO2 26 10/12/2022 1100   GLUCOSE 113 (H) 10/12/2022 1100   BUN 18 10/12/2022 1100   CREATININE 0.77 10/12/2022 1100   CREATININE 1.02 (H) 12/28/2019 0749    CALCIUM 8.1 (L) 10/12/2022 1100   PROT 5.2 (L) 10/12/2022 1100   ALBUMIN 2.9 (L) 10/12/2022 1100   AST 5 (L) 10/12/2022 1100   ALT 8 10/12/2022 1100   ALKPHOS 85 10/12/2022 1100   BILITOT 0.3 10/12/2022 1100   GFRNONAA >60 10/12/2022 1100    No results found for: "SPEP", "UPEP"  Lab Results  Component Value Date   WBC 1.2 (L) 10/12/2022   NEUTROABS 0.6 (L) 10/12/2022   HGB 7.0 (L) 10/12/2022   HCT 19.5 (L) 10/12/2022   MCV 89.9 10/12/2022   PLT 107 (L) 10/12/2022      Chemistry      Component Value Date/Time   NA 136 10/12/2022 1100   K 3.8 10/12/2022 1100   CL 102 10/12/2022 1100   CO2 26 10/12/2022 1100   BUN 18 10/12/2022 1100   CREATININE 0.77 10/12/2022 1100   CREATININE 1.02 (H) 12/28/2019 0749      Component Value Date/Time   CALCIUM 8.1 (L) 10/12/2022 1100   ALKPHOS 85 10/12/2022 1100   AST 5 (L) 10/12/2022 1100   ALT 8 10/12/2022 1100   BILITOT 0.3 10/12/2022 1100       RADIOGRAPHIC STUDIES: I have personally reviewed the radiological images as listed and agreed with the findings in the report. DG Abd 2 Views  Result Date: 09/26/2022 CLINICAL DATA:  Gastrostomy tube positioning EXAM: ABDOMEN - 2 VIEW COMPARISON:  08/21/2022 FINDINGS: Hepatic 2 projects over the abdomen with distal portion just to the left of midline, and in the expected vicinity of the stomach body, and not substantially changed from the placement exam of 08/21/2022. Clinically indicated, water-soluble contrast medium could be injected in the tube followed by immediate radiography in order to confirm gastric placement and lack of leak. Bowel gas pattern unremarkable. There is some minimal indistinctness of the left hemidiaphragm may be incidental, although mild atelectasis in the left lower lobe is not excluded. IMPRESSION: 1. Gastrostomy tube projects over the stomach body. If clinically indicated, water-soluble contrast medium could be injected in the tube followed by immediate radiography in  order to confirm gastric placement and lack of leak. 2. Minimal indistinctness of the left hemidiaphragm may be incidental, although mild atelectasis in the left lower lobe is not excluded.  Electronically Signed   By: Gaylyn Rong M.D.   On: 09/26/2022 12:17

## 2022-10-12 NOTE — Patient Instructions (Signed)

## 2022-10-12 NOTE — Assessment & Plan Note (Signed)
She will continue tube feeds as directed by nutritionist We will try to arrange for electrolyte replacement therapy

## 2022-10-12 NOTE — Assessment & Plan Note (Signed)
I recommend daily MiraLAX

## 2022-10-12 NOTE — Assessment & Plan Note (Signed)
Due to poor mobility and third spacing Observe only I do not recommend furosemide or diuretic therapy as it can exacerbate dehydration

## 2022-10-12 NOTE — Progress Notes (Signed)
Treatment plan discontinued

## 2022-10-12 NOTE — Assessment & Plan Note (Signed)
She has just completed chemoradiation therapy, complicated by severe pancytopenia, malnutrition, uncontrolled pain, constipation, electrolyte imbalance and others We will continue aggressive supportive care

## 2022-10-12 NOTE — Progress Notes (Signed)
Nutrition Follow-up:  Patient with supraglottic cancer. She has completed concurrent chemoradiation with weekly cisplatin x 5 cycles (Final RT 7/1). Patient is currently receiving supportive care with IV fluids   Met with patient and husband in infusion. Patient reports significant pain with swallowing continues. Water burns throat to drink. Patient complaining of new swelling and lump on right side of neck. Patient states there is something else going on. She is being seen by Dr. Bertis Ruddy today. Patient has new bilateral ankle swelling (+2 per RD exam).   Patient reports tolerating continuous feedings at 30 ml/hr. She did not increase tube feeding to 40 ml/hr on 7/4. Patient reports nausea this morning. She has taken antiemetic as well as pain medication. Patient denies diarrhea, constipation.   Medications: reviewed   Labs: Hgb 7.0, glucose 113, albumin 2.9 - K 3.8>3.6, Phos 3.2>2.8, Mg 1.3<1.6 (suspect r/t chemo vs refeeding given K/Phos WNL + uptrend from 7/3)  Anthropometrics: Last wt 143 lb 12.8 oz on 7/1  Estimated Energy Needs   Kcals: 2115-2400 Protein: 92-106 Fluid: >2.1 L   NUTRITION DIAGNOSIS: Predicted suboptimal intake continues - TF advancing towards goal  INTERVENTION:  Continue advancing towards goal. Increased continuous feedings to 40 ml/hr x 24 hours. If tolerated pt to increase to 50 ml/hr on 7/6 Secure chat with Dr. Bertis Ruddy regarding hypomagnesia - pt to receive 2g IV Mg + blood products 7/6 Continue antiemetics as scheduled     MONITORING, EVALUATION, GOAL: weight trends, intake, TF, labs   NEXT VISIT: Friday July 19 after MD

## 2022-10-13 ENCOUNTER — Inpatient Hospital Stay: Payer: 59

## 2022-10-13 ENCOUNTER — Other Ambulatory Visit: Payer: Self-pay

## 2022-10-13 VITALS — BP 147/84 | HR 99 | Temp 98.4°F | Resp 16

## 2022-10-13 DIAGNOSIS — Z95828 Presence of other vascular implants and grafts: Secondary | ICD-10-CM

## 2022-10-13 DIAGNOSIS — Z79899 Other long term (current) drug therapy: Secondary | ICD-10-CM | POA: Diagnosis not present

## 2022-10-13 DIAGNOSIS — E860 Dehydration: Secondary | ICD-10-CM | POA: Diagnosis not present

## 2022-10-13 DIAGNOSIS — D696 Thrombocytopenia, unspecified: Secondary | ICD-10-CM | POA: Diagnosis not present

## 2022-10-13 DIAGNOSIS — D61818 Other pancytopenia: Secondary | ICD-10-CM

## 2022-10-13 DIAGNOSIS — C770 Secondary and unspecified malignant neoplasm of lymph nodes of head, face and neck: Secondary | ICD-10-CM

## 2022-10-13 DIAGNOSIS — C140 Malignant neoplasm of pharynx, unspecified: Secondary | ICD-10-CM | POA: Diagnosis not present

## 2022-10-13 LAB — BPAM RBC

## 2022-10-13 LAB — TYPE AND SCREEN

## 2022-10-13 MED ORDER — ONDANSETRON HCL 4 MG/2ML IJ SOLN
8.0000 mg | Freq: Once | INTRAMUSCULAR | Status: AC
  Administered 2022-10-13: 8 mg via INTRAVENOUS

## 2022-10-13 MED ORDER — ONDANSETRON HCL 4 MG/2ML IJ SOLN
INTRAMUSCULAR | Status: AC
  Filled 2022-10-13: qty 4

## 2022-10-13 MED ORDER — DIPHENHYDRAMINE HCL 50 MG/ML IJ SOLN
12.5000 mg | Freq: Once | INTRAMUSCULAR | Status: AC
  Administered 2022-10-13: 12.5 mg via INTRAVENOUS
  Filled 2022-10-13: qty 0.25
  Filled 2022-10-13: qty 1

## 2022-10-13 MED ORDER — MAGNESIUM SULFATE 2 GM/50ML IV SOLN
2.0000 g | Freq: Once | INTRAVENOUS | Status: AC
  Administered 2022-10-13: 2 g via INTRAVENOUS
  Filled 2022-10-13: qty 50

## 2022-10-13 MED ORDER — MORPHINE SULFATE (PF) 4 MG/ML IV SOLN
4.0000 mg | Freq: Once | INTRAVENOUS | Status: AC
  Administered 2022-10-13: 4 mg via INTRAVENOUS
  Filled 2022-10-13: qty 1

## 2022-10-13 MED ORDER — SODIUM CHLORIDE 0.9% IV SOLUTION
250.0000 mL | Freq: Once | INTRAVENOUS | Status: AC
  Administered 2022-10-13: 250 mL via INTRAVENOUS

## 2022-10-13 MED ORDER — SODIUM CHLORIDE 0.9% FLUSH
10.0000 mL | INTRAVENOUS | Status: AC | PRN
  Administered 2022-10-13: 10 mL

## 2022-10-13 MED ORDER — HEPARIN SOD (PORK) LOCK FLUSH 100 UNIT/ML IV SOLN
500.0000 [IU] | Freq: Every day | INTRAVENOUS | Status: AC | PRN
  Administered 2022-10-13: 500 [IU]

## 2022-10-13 NOTE — Progress Notes (Signed)
Pt here for blood transfusion.  Upon sitting down in chair ,  pt vomited large amount of liquids.  Pt has continuous tube feed.  Per pt and husband,  pt had been vomited off and on for several occasions.  Stated she had taken Morphine liquid this am at 645 am for throat pain s/p radiation.   Dr. Bertis Ruddy notified.  Order received for Zofran 8 mg IV .  Pt also received  Benadryl 12.5 mg  IV prior to blood transfusion. Pt later informed nurse that she had taken First dose of Morphine yesterday evening .  Did have some spitting but not vomited .  Pt stated she had been taken Morphine every 4 hrs as needed.  Pain in throat rated  5 - 6/10 .   Pain never was below this rating.   Just finished radiation this past Monday  10/08/22. Explanations  and instructions given to pt and husband about breakthrough pain meds.  Pt voiced understanding. Gave pt Morphine  4 mg  IV while in clinic post blood transfusion.  Pt was monitored for 20 min post Morphine  without further vomiting.  Just had dry heaves. Pt was stable at discharge via wheelchair by husband.

## 2022-10-13 NOTE — Patient Instructions (Signed)
Blood Transfusion, Adult A blood transfusion is a procedure in which you receive blood or a type of blood cell (blood component) through an IV. You may need a blood transfusion when you have a low blood count, which is a low number of any blood cell. This may result from a bleeding disorder, illness, injury, or surgery. The blood may come from a donor, or you may be able to have your own blood collected and stored (autologous blood donation) before a planned surgery. The blood given in a transfusion may be made up of different blood components. You may receive: Red blood cells. These carry oxygen to the cells in the body. Platelets. These help your blood to clot. Plasma. This is the liquid part of your blood. It carries proteins and other substances throughout the body. White blood cells. These help you fight infections. If you have hemophilia or another clotting disorder, you may also receive other types of blood products. Depending on the type of blood product, this procedure may take 1-4 hours to complete. Tell a health care provider about: Any bleeding problems you have. Any previous reactions you have had during a blood transfusion. Any allergies you have. All medicines you are taking, including vitamins, herbs, eye drops, creams, and over-the-counter medicines. Any surgeries you have had. Any medical conditions you have. Whether you are pregnant or may be pregnant. What are the risks? Talk with your health care provider about risks. The most common problems include: A mild allergic reaction, such as red, swollen areas of skin (hives) and itching. Fever or chills. This may be the body's response to new blood cells received. This may occur during or up to 4 hours after the transfusion. More serious problems may include: A serious allergic reaction that causes difficulty breathing or swelling around the face and lips. Transfusion-associated circulatory overload (TACO), or too much fluid in  the lungs. This may cause breathing problems. Transfusion-related acute lung injury (TRALI), which causes breathing difficulty and low oxygen in the blood. This can occur within hours of the transfusion or several days later. Iron overload. This can happen after receiving many blood transfusions over a period of time. Infection or virus being transmitted. This is rare because donated blood is carefully tested before it is given. Hemolytic transfusion reaction. This is rare. It happens when the body's defense system (immune system)tries to attack the new blood cells. Symptoms may include fever, chills, nausea, low blood pressure, and low back or chest pain. Transfusion-associated graft-versus-host disease (TAGVHD). This is rare. It happens when donated cells attack the body's healthy tissues. What happens before the procedure? You will have a blood test to check your blood type. This test is done to know what kind of blood your body will accept and to match it to the donor blood. If you are going to have a planned surgery, you may be able to do an autologous blood donation. This may be done in case you need to have a transfusion. You will have your temperature, blood pressure, and pulse checked before the transfusion. If you have had an allergic reaction to a transfusion in the past, you may be given medicine to help prevent a reaction. This medicine may be given to you by mouth (orally) or through an IV. What happens during the procedure?  An IV will be inserted into one of your veins. The bag of blood will be attached to your IV. The blood will then enter through your vein. Your temperature, blood pressure,   and pulse will be monitored during the transfusion. This monitoring is done to detect early signs of a transfusion reaction. Tell your nurse right away if you have any of these symptoms during the transfusion: Shortness of breath or trouble breathing. Chest or back pain. Fever or  chills. Itching or hives. If you have any signs or symptoms of a reaction, your transfusion will be stopped and you may be given medicine. When the transfusion is complete, your IV will be removed. Pressure may be applied to the IV site for a few minutes. A bandage (dressing)will be applied. The procedure may vary among health care providers and hospitals. What happens after the procedure? Your temperature, blood pressure, pulse, breathing rate, and blood oxygen level will be monitored until you leave the hospital or clinic. Your blood may be tested to see how you have responded to the transfusion. You may be warmed with fluids or blankets to maintain a normal body temperature. If you receive your blood transfusion in an outpatient setting, you will be told whom to contact to report any reactions. Where to find more information Visit the [employer]: redcross.org Summary A blood transfusion is a procedure in which you receive blood or a type of blood cell (blood component) through an IV. The blood given in a transfusion may be made up of different blood components. You may receive red blood cells, platelets, plasma, or white blood cells depending on the condition treated. Your temperature, blood pressure, and pulse will be monitored before, during, and after the transfusion. After the transfusion, your blood may be tested to see how your body has responded. This information is not intended to replace advice given to you by your health care provider. Make sure you discuss any questions you have with your health care provider. Document Revised: 06/23/2021 Document Reviewed: 06/23/2021 Elsevier Patient Education  2024 [employer]   

## 2022-10-15 ENCOUNTER — Encounter: Payer: Self-pay | Admitting: Adult Health

## 2022-10-15 ENCOUNTER — Telehealth: Payer: Self-pay | Admitting: *Deleted

## 2022-10-15 ENCOUNTER — Other Ambulatory Visit: Payer: Self-pay | Admitting: Adult Health

## 2022-10-15 ENCOUNTER — Inpatient Hospital Stay (HOSPITAL_BASED_OUTPATIENT_CLINIC_OR_DEPARTMENT_OTHER): Payer: 59 | Admitting: Adult Health

## 2022-10-15 ENCOUNTER — Other Ambulatory Visit: Payer: Self-pay

## 2022-10-15 ENCOUNTER — Other Ambulatory Visit (HOSPITAL_COMMUNITY): Payer: Self-pay

## 2022-10-15 ENCOUNTER — Inpatient Hospital Stay: Payer: 59

## 2022-10-15 VITALS — BP 133/87 | HR 111 | Temp 97.7°F | Resp 17 | Wt 151.8 lb

## 2022-10-15 DIAGNOSIS — C770 Secondary and unspecified malignant neoplasm of lymph nodes of head, face and neck: Secondary | ICD-10-CM

## 2022-10-15 DIAGNOSIS — E041 Nontoxic single thyroid nodule: Secondary | ICD-10-CM

## 2022-10-15 DIAGNOSIS — T451X5A Adverse effect of antineoplastic and immunosuppressive drugs, initial encounter: Secondary | ICD-10-CM | POA: Diagnosis not present

## 2022-10-15 DIAGNOSIS — G893 Neoplasm related pain (acute) (chronic): Secondary | ICD-10-CM

## 2022-10-15 DIAGNOSIS — D61818 Other pancytopenia: Secondary | ICD-10-CM

## 2022-10-15 DIAGNOSIS — E43 Unspecified severe protein-calorie malnutrition: Secondary | ICD-10-CM | POA: Diagnosis not present

## 2022-10-15 DIAGNOSIS — Z95828 Presence of other vascular implants and grafts: Secondary | ICD-10-CM

## 2022-10-15 DIAGNOSIS — D696 Thrombocytopenia, unspecified: Secondary | ICD-10-CM | POA: Diagnosis not present

## 2022-10-15 DIAGNOSIS — C140 Malignant neoplasm of pharynx, unspecified: Secondary | ICD-10-CM | POA: Diagnosis not present

## 2022-10-15 DIAGNOSIS — Z79899 Other long term (current) drug therapy: Secondary | ICD-10-CM | POA: Diagnosis not present

## 2022-10-15 DIAGNOSIS — R600 Localized edema: Secondary | ICD-10-CM

## 2022-10-15 DIAGNOSIS — E860 Dehydration: Secondary | ICD-10-CM | POA: Diagnosis not present

## 2022-10-15 DIAGNOSIS — R112 Nausea with vomiting, unspecified: Secondary | ICD-10-CM

## 2022-10-15 DIAGNOSIS — E878 Other disorders of electrolyte and fluid balance, not elsewhere classified: Secondary | ICD-10-CM

## 2022-10-15 LAB — SAMPLE TO BLOOD BANK

## 2022-10-15 LAB — CMP (CANCER CENTER ONLY)
ALT: 14 U/L (ref 0–44)
AST: 11 U/L — ABNORMAL LOW (ref 15–41)
Albumin: 2.7 g/dL — ABNORMAL LOW (ref 3.5–5.0)
Alkaline Phosphatase: 98 U/L (ref 38–126)
Anion gap: 7 (ref 5–15)
BUN: 19 mg/dL (ref 8–23)
CO2: 28 mmol/L (ref 22–32)
Calcium: 8.4 mg/dL — ABNORMAL LOW (ref 8.9–10.3)
Chloride: 98 mmol/L (ref 98–111)
Creatinine: 0.87 mg/dL (ref 0.44–1.00)
GFR, Estimated: >60 mL/min (ref >60)
Glucose, Bld: 126 mg/dL — ABNORMAL HIGH (ref 70–99)
Potassium: 4.9 mmol/L (ref 3.5–5.1)
Sodium: 133 mmol/L — ABNORMAL LOW (ref 135–145)
Total Bilirubin: 0.3 mg/dL (ref 0.3–1.2)
Total Protein: 5.4 g/dL — ABNORMAL LOW (ref 6.5–8.1)

## 2022-10-15 LAB — CBC WITH DIFFERENTIAL (CANCER CENTER ONLY)
Abs Immature Granulocytes: 0.26 10*3/uL — ABNORMAL HIGH (ref 0.00–0.07)
Basophils Absolute: 0 10*3/uL (ref 0.0–0.1)
Basophils Relative: 0 %
Eosinophils Absolute: 0 10*3/uL (ref 0.0–0.5)
Eosinophils Relative: 0 %
HCT: 24.5 % — ABNORMAL LOW (ref 36.0–46.0)
Hemoglobin: 8.4 g/dL — ABNORMAL LOW (ref 12.0–15.0)
Immature Granulocytes: 10 %
Lymphocytes Relative: 15 %
Lymphs Abs: 0.4 10*3/uL — ABNORMAL LOW (ref 0.7–4.0)
MCH: 31.2 pg (ref 26.0–34.0)
MCHC: 34.3 g/dL (ref 30.0–36.0)
MCV: 91.1 fL (ref 80.0–100.0)
Monocytes Absolute: 0.6 10*3/uL (ref 0.1–1.0)
Monocytes Relative: 23 %
Neutro Abs: 1.3 10*3/uL — ABNORMAL LOW (ref 1.7–7.7)
Neutrophils Relative %: 52 %
Platelet Count: 199 10*3/uL (ref 150–400)
RBC: 2.69 MIL/uL — ABNORMAL LOW (ref 3.87–5.11)
RDW: 13.2 % (ref 11.5–15.5)
Smear Review: NORMAL
WBC Count: 2.6 10*3/uL — ABNORMAL LOW (ref 4.0–10.5)
nRBC: 1.2 % — ABNORMAL HIGH (ref 0.0–0.2)

## 2022-10-15 LAB — MAGNESIUM: Magnesium: 1.6 mg/dL — ABNORMAL LOW (ref 1.7–2.4)

## 2022-10-15 LAB — BPAM RBC
Blood Product Expiration Date: 202407312359
Unit Type and Rh: 5100

## 2022-10-15 LAB — TYPE AND SCREEN: Unit division: 0

## 2022-10-15 LAB — PHOSPHORUS: Phosphorus: 4.2 mg/dL (ref 2.5–4.6)

## 2022-10-15 MED ORDER — FENTANYL 12 MCG/HR TD PT72
2.0000 | MEDICATED_PATCH | TRANSDERMAL | 0 refills | Status: DC
Start: 2022-10-15 — End: 2022-10-15

## 2022-10-15 MED ORDER — ONDANSETRON 8 MG PO TBDP
8.0000 mg | ORAL_TABLET | Freq: Three times a day (TID) | ORAL | 0 refills | Status: DC | PRN
Start: 2022-10-15 — End: 2022-10-31

## 2022-10-15 MED ORDER — FENTANYL 12 MCG/HR TD PT72
2.0000 | MEDICATED_PATCH | TRANSDERMAL | 0 refills | Status: DC
Start: 2022-10-15 — End: 2022-12-19
  Filled 2022-10-15: qty 10, 15d supply, fill #0

## 2022-10-15 MED ORDER — SODIUM CHLORIDE 0.9% FLUSH
10.0000 mL | Freq: Once | INTRAVENOUS | Status: AC
  Administered 2022-10-15: 10 mL

## 2022-10-15 MED ORDER — HEPARIN SOD (PORK) LOCK FLUSH 100 UNIT/ML IV SOLN
500.0000 [IU] | Freq: Once | INTRAVENOUS | Status: AC
  Administered 2022-10-15: 500 [IU]

## 2022-10-15 NOTE — Assessment & Plan Note (Addendum)
She is status post chemoradiation and is starting to heal.  She slowly recovers we will see her a little bit more frequently.  She will return on Thursday for labs only and next week for labs and follow-up.

## 2022-10-15 NOTE — Assessment & Plan Note (Signed)
Her lower extremity edema is stable.  I recommended that she continue to elevate her feet.  There is no indication for diuretics at this point.  She does not have any calf pain, but knows if she develops this to let me know and I will order a Doppler.

## 2022-10-15 NOTE — Assessment & Plan Note (Signed)
She is currently tolerating fentanyl patches 12 mcg, 2 patches every 72 hours, along with Roxanol 0.2 mL every 6 hours as needed for pain.  She will continue this for now.  Her pain is under control, she is not having increased side effects from her pain medication such as constipation, nausea/vomiting, or increased somnolence.  I sent in a refill of her fentanyl patches today.

## 2022-10-15 NOTE — Assessment & Plan Note (Signed)
Her labs are improved today.  Her ANC is 1.3, her hemoglobin 8.4, and her platelets 185. She does not require a transfusion, and I anticipate they will slowly continue to improve.

## 2022-10-15 NOTE — Assessment & Plan Note (Signed)
Her continuous tube feeds currently at a rate of 30 mL an hour.  I sent a message to our nutritionist to understand when they recommend her repeat lab testing and when they want to follow-up with her.  Her albumin today is 2.7.

## 2022-10-15 NOTE — Progress Notes (Signed)
[facility] Cancer Follow up:    Nichole Craze, NP [address] [place] [address]   DIAGNOSIS:  Cancer Staging  Secondary malignant neoplasm of cervical lymph node (HCC) Staging form: Pharynx - HPV-Mediated Oropharynx, AJCC 8th Edition - Clinical stage from 08/10/2022: Stage I (cT0, cN1, cM0, p16+) - Signed by Lonie Peak, MD on 08/11/2022 Stage prefix: Initial diagnosis   SUMMARY OF ONCOLOGIC HISTORY: Oncology History  Secondary malignant neoplasm of cervical lymph node (HCC)  06/26/2022 Imaging   Neck CT: a 4.4 cm x 3.0 cm x 4.1 cm soft tissue mass centered in the right parapharyngeal space, favored to be arising from the deep lobe of the right parotid gland, 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 The mass appeared to encase a portion of the right external carotid artery and partially encases the right internal carotid artery Prominent right-sided cervical and upper mediastinal  lymph nodes were also appreciated, suspicious for metastatic spread of  disease    07/24/2022 Imaging   MRI of the face and neck  showed the infiltrative mass lesion centered in the right carotid, parapharyngeal and parotid spaces with extensive surrounding local invasion, and findings concerning for metastatic adenopathy in the right neck and additional nonspecific mildly enlarged and rounded lymph nodes involving the upper mediastinum and contralateral left jugular chain. MRI also showed evidence of tumor extension within close proximity to multiple skill base foramina, along with evidence of right hypoglossal nerve involvement contributing to right hemitongue denervation changes, and tumor thrombus within the encased right internal jugular vein. MRI otherwise showed no evidence of intracranial perineural tumor spread.    07/27/2022 Initial Biopsy   FNA of the right neck mass showed carcinoma with squamous features, favoring squamous cell carcinoma (however,  other tumors with squamous differentiation cannot entirely be excluded); P16 diffusely positive; HPV focally positive.    08/02/2022 PET scan   Whole body PET scan: the dominant right level IIa metastatic nodal mass measuring 4.9 x 2.5 cm with an SUV max of 15; multiple ipsilateral nodal metastases involving the right level 3, level 4, and level 5 lymph node stations including the dominant right level 3 nodal metastasis (measuring 1.1 cm) coming in contact with the right oropharynx without discrete primary tumor; FDG avidity within the left palatine tonsil (normal in size and with a max SUV of 4.4); and mildly FDG avid mediastinal, bihilar, and subcarinal lymph nodes. No other sites concerning for metastatic disease were appreciated elsewhere in the body.    08/10/2022 Cancer Staging   Staging form: Pharynx - HPV-Mediated Oropharynx, AJCC 8th Edition - Clinical stage from 08/10/2022: Stage I (cT0, cN1, cM0, p16+) - Signed by Lonie Peak, MD on 08/11/2022 Stage prefix: Initial diagnosis   08/21/2022 Procedure   Port placement and G-tube placement   08/23/2022 - 10/08/2022 Radiation Therapy   Concurrent radiation   08/24/2022 - 10/05/2022 Chemotherapy   Patient is on Treatment Plan : HEAD/NECK Cisplatin (40) q7d       CURRENT THERAPY: s/p radiation  INTERVAL HISTORY: Nichole Dominguez 67 y.o. 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  She completed radiation last week.  Her course was complicated by sore throat, nausea, difficulty tolerating her tube feeds, dehydration, hypomagnesemia, symptomatic anemia, treatment related neutropenia, and significant throat pain.  Last week she received 1 unit of blood.  She was prescribed 1 fentanyl patch however this did not manage her  pain so she increase that to 2 fentanyl patches every 72 hours and was prescribed Roxanol.  She adjusted the dose of Roxanol to 0.2 mL as she found that  the whole 1 mL was too nauseating for her to take.    On continuous tube feeds.  Her goal is to get to 68 mL an hour.  Right now she is at 30 mL an hour.  When she had advanced it previously to 40 mL an hour she had difficulty with nausea.  She assures me she is getting in the full 220 mL of free water 4 times a day.  She is also tolerating her medications better through her tube.  She experiences bilateral lower extremity swelling with some pain in her toe ont he bottom of her left second digit.  Otherwise she is feeling well.  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  MEDICAL HISTORY: Past Medical History:  Diagnosis Date   Allergy    GERD (gastroesophageal reflux disease)    Osteopenia 01/04/2020   Plantar fasciitis    Seasonal allergies     SURGICAL HISTORY: Past Surgical History:  Procedure Laterality Date   COLONOSCOPY  2011   IR GASTROSTOMY TUBE MOD SED  08/21/2022   IR IMAGING GUIDED PORT INSERTION  08/21/2022   IR PATIENT EVAL TECH 0-60 MINS  08/31/2022   IR PATIENT EVAL TECH 0-60 MINS  09/07/2022   MOUTH SURGERY  01/2020   implant inserted   TUBAL LIGATION  1992    SOCIAL HISTORY: Social History   Socioeconomic History   Marital status: Married     Spouse name: Not on file   Number of children: Not on file   Years of education: Not on file   Highest education level: Not on file  Occupational History   Not on file  Tobacco Use   Smoking status: Former    Packs/day: 1.00    Years: 24.00    Additional pack years: 0.00    Total pack years: 24.00    Types: Cigarettes    Quit date: 10/10/1996    Years since quitting: 26.0   Smokeless tobacco: Never  Vaping Use   Vaping Use: Never used  Substance and Sexual Activity   Alcohol use: Yes    Alcohol/week: 0.0 standard drinks of alcohol    Comment: 1 shot vodka daily   Drug use: No   Sexual activity: Yes    Partners: Male  Other Topics Concern   Not on file  Social History Narrative   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    Lives with husband   1 daughter in [place], 62 grandchildren   Son died   2 grown stepchildren- both Designer, multimedia, grandchildren   Enjoys Corporate investment banker   Social Determinants of Corporate investment banker Strain:  Not on file  Food Insecurity: No Food Insecurity (08/09/2022)   Hunger Vital Sign    Worried About Running Out of Food in the Last Year: Never true    Ran Out of Food in the Last Year: Never true  Transportation Needs: No Transportation Needs (08/09/2022)   PRAPARE - Administrator, Civil Service (Medical): No    Lack of Transportation (Non-Medical): No  Physical Activity: Not on file  Stress: Not on file  Social Connections: Not on file  Intimate Partner Violence: Not At Risk (08/09/2022)   Humiliation, Afraid, Rape, and Kick questionnaire    Fear of Current or Ex-Partner: No    Emotionally Abused: No    Physically Abused: No    Sexually Abused: No    FAMILY HISTORY: Family History  Problem Relation Age of Onset   Diabetes Mother    Heart disease Mother        chf   Hypertension Mother    Cervical cancer Mother    Obesity Mother    Colon polyps Mother    Thyroid cancer Father    Cancer Father        thyroid x2    Heart attack Father    Breast cancer Sister    Lupus Sister    Obesity Maternal Grandmother    Diabetes Maternal Grandmother    Heart disease Maternal Grandmother    Hypertension Maternal Grandmother    Stroke Maternal Grandmother    Diabetes Mellitus II Paternal Grandmother        died from diabetic coma   Cancer Paternal Grandfather        not sure what type   Heart attack Son    Esophageal cancer Neg Hx    Stomach cancer Neg Hx    Rectal cancer Neg Hx    Colon cancer Neg Hx     Review of Systems - Oncology    PHYSICAL EXAMINATION   Onc Performance Status - 10/15/22 1319       KPS SCALE   KPS % SCORE Requires occasional assistance but is able to care for most needs             Vitals:   10/15/22 1316  BP: 133/87  Pulse: (!) 111  Resp: 17  Temp: 97.7 F (36.5 C)  SpO2: 92%    Physical Exam  LABORATORY DATA:  CBC    Component Value Date/Time   WBC 2.6 (L) 10/15/2022 1245   WBC 7.7 08/21/2022 1000   RBC 2.69 (L) 10/15/2022 1245   HGB 8.4 (L) 10/15/2022 1245   HCT 24.5 (L) 10/15/2022 1245   PLT 199 10/15/2022 1245   MCV 91.1 10/15/2022 1245   MCH 31.2 10/15/2022 1245   MCHC 34.3 10/15/2022 1245   RDW 13.2 10/15/2022 1245   LYMPHSABS 0.4 (L) 10/15/2022 1245   MONOABS 0.6 10/15/2022 1245   EOSABS 0.0 10/15/2022 1245   BASOSABS 0.0 10/15/2022 1245    CMP     Component Value Date/Time   NA 133 (L) 10/15/2022 1245   K 4.9 10/15/2022 1245   CL 98 10/15/2022 1245   CO2 28 10/15/2022 1245   GLUCOSE 126 (H) 10/15/2022 1245   BUN 19 10/15/2022 1245   CREATININE 0.87 10/15/2022 1245   CREATININE 1.02 (H) 12/28/2019 0749   CALCIUM 8.4 (L) 10/15/2022 1245   PROT 5.4 (L) 10/15/2022 1245   ALBUMIN 2.7 (L) 10/15/2022 1245   AST 11 (L) 10/15/2022 1245   ALT 14  10/15/2022 1245   ALKPHOS 98 10/15/2022 1245   BILITOT 0.3 10/15/2022 1245   GFRNONAA >60 10/15/2022 1245      ASSESSMENT and THERAPY PLAN:   Cancer associated pain She is currently  tolerating fentanyl patches 12 mcg, 2 patches every 72 hours, along with Roxanol 0.2 mL every 6 hours as needed for pain.  She will continue this for now.  Her pain is under control, she is not having increased side effects from her pain medication such as constipation, nausea/vomiting, or increased somnolence.  I sent in a refill of her fentanyl patches today #10.  Severe protein-calorie malnutrition (HCC) Her continuous tube feeds currently at a rate of 30 mL an hour.  I sent a message to our nutritionist to learn if she has any additional recommendations. Her albumin today is 2.7.  Lower extremity edema Her lower extremity edema is stable.  I recommended that she continue to elevate her feet.  There is no indication for diuretics at this point.  She does not have any calf pain, but knows if she develops this to let me know and I will order a Doppler.  Hypomagnesemia Will administer IV magnesium tomorrow.  She will return for repeat labs on Friday.    Pancytopenia, acquired (HCC) Her labs are improved today.  Her ANC is 1.3, her hemoglobin 8.4, and her platelets 185. She does not require a transfusion, and I anticipate they will slowly continue to improve.   Secondary malignant neoplasm of cervical lymph node (HCC) She is status post chemoradiation and is starting to heal.  She slowly recovers we will see her a little bit more frequently.  She will return on Thursday for labs only and next week for labs and follow-up.  RTC Thursday lab only, and in 1 week for labs and f/u.  All questions were answered. The patient knows to call the clinic with any problems, questions or concerns. We can certainly see the patient much sooner if necessary.  Total encounter time:45 minutes*in face-to-face visit time, chart review, lab review, care coordination, order entry, and documentation of the encounter time.  Lillard Anes, NP 10/15/22 2:03 PM Medical Oncology and Hematology [facility] [address] [place], [place] [address] Tel. [phone]    Fax. [phone]  *Total Encounter Time as defined by the Centers for Medicare and Medicaid Services includes, in addition to the face-to-face time of a patient visit (documented in the note above) non-face-to-face time: obtaining and reviewing outside history, ordering and reviewing medications, tests or procedures, care coordination (communications with other health care professionals or caregivers) and documentation in the medical record.

## 2022-10-15 NOTE — Assessment & Plan Note (Signed)
Will administer IV magnesium tomorrow.  She will return for repeat labs on Friday.

## 2022-10-15 NOTE — Telephone Encounter (Signed)
Pt notified that she needs IV magnesium and we have time tomorrow at 1:00 pm. She confirmed that she is available.

## 2022-10-16 ENCOUNTER — Inpatient Hospital Stay: Payer: 59

## 2022-10-16 DIAGNOSIS — Z79899 Other long term (current) drug therapy: Secondary | ICD-10-CM | POA: Diagnosis not present

## 2022-10-16 DIAGNOSIS — C140 Malignant neoplasm of pharynx, unspecified: Secondary | ICD-10-CM | POA: Diagnosis not present

## 2022-10-16 DIAGNOSIS — E860 Dehydration: Secondary | ICD-10-CM | POA: Diagnosis not present

## 2022-10-16 DIAGNOSIS — D696 Thrombocytopenia, unspecified: Secondary | ICD-10-CM | POA: Diagnosis not present

## 2022-10-16 MED ORDER — MAGNESIUM SULFATE 2 GM/50ML IV SOLN
2.0000 g | Freq: Once | INTRAVENOUS | Status: AC
  Administered 2022-10-16: 2 g via INTRAVENOUS
  Filled 2022-10-16: qty 50

## 2022-10-17 DIAGNOSIS — C140 Malignant neoplasm of pharynx, unspecified: Secondary | ICD-10-CM | POA: Diagnosis not present

## 2022-10-18 ENCOUNTER — Inpatient Hospital Stay: Payer: 59

## 2022-10-18 DIAGNOSIS — C140 Malignant neoplasm of pharynx, unspecified: Secondary | ICD-10-CM | POA: Diagnosis not present

## 2022-10-18 DIAGNOSIS — C770 Secondary and unspecified malignant neoplasm of lymph nodes of head, face and neck: Secondary | ICD-10-CM

## 2022-10-18 DIAGNOSIS — D696 Thrombocytopenia, unspecified: Secondary | ICD-10-CM | POA: Diagnosis not present

## 2022-10-18 DIAGNOSIS — E878 Other disorders of electrolyte and fluid balance, not elsewhere classified: Secondary | ICD-10-CM

## 2022-10-18 DIAGNOSIS — E041 Nontoxic single thyroid nodule: Secondary | ICD-10-CM

## 2022-10-18 DIAGNOSIS — D61818 Other pancytopenia: Secondary | ICD-10-CM

## 2022-10-18 DIAGNOSIS — E860 Dehydration: Secondary | ICD-10-CM | POA: Diagnosis not present

## 2022-10-18 DIAGNOSIS — Z95828 Presence of other vascular implants and grafts: Secondary | ICD-10-CM

## 2022-10-18 DIAGNOSIS — Z79899 Other long term (current) drug therapy: Secondary | ICD-10-CM | POA: Diagnosis not present

## 2022-10-18 LAB — CMP (CANCER CENTER ONLY)
ALT: 10 U/L (ref 0–44)
AST: 8 U/L — ABNORMAL LOW (ref 15–41)
Albumin: 2.8 g/dL — ABNORMAL LOW (ref 3.5–5.0)
Alkaline Phosphatase: 98 U/L (ref 38–126)
Anion gap: 8 (ref 5–15)
BUN: 18 mg/dL (ref 8–23)
CO2: 27 mmol/L (ref 22–32)
Calcium: 8.6 mg/dL — ABNORMAL LOW (ref 8.9–10.3)
Chloride: 100 mmol/L (ref 98–111)
Creatinine: 0.79 mg/dL (ref 0.44–1.00)
GFR, Estimated: >60 mL/min (ref >60)
Glucose, Bld: 114 mg/dL — ABNORMAL HIGH (ref 70–99)
Potassium: 4.6 mmol/L (ref 3.5–5.1)
Sodium: 135 mmol/L (ref 135–145)
Total Bilirubin: 0.3 mg/dL (ref 0.3–1.2)
Total Protein: 5.8 g/dL — ABNORMAL LOW (ref 6.5–8.1)

## 2022-10-18 LAB — CBC WITH DIFFERENTIAL (CANCER CENTER ONLY)
Abs Immature Granulocytes: 0.64 10*3/uL — ABNORMAL HIGH (ref 0.00–0.07)
Basophils Absolute: 0.1 10*3/uL (ref 0.0–0.1)
Basophils Relative: 1 %
Eosinophils Absolute: 0 10*3/uL (ref 0.0–0.5)
Eosinophils Relative: 1 %
HCT: 26.6 % — ABNORMAL LOW (ref 36.0–46.0)
Hemoglobin: 8.9 g/dL — ABNORMAL LOW (ref 12.0–15.0)
Immature Granulocytes: 12 %
Lymphocytes Relative: 11 %
Lymphs Abs: 0.6 10*3/uL — ABNORMAL LOW (ref 0.7–4.0)
MCH: 31.1 pg (ref 26.0–34.0)
MCHC: 33.5 g/dL (ref 30.0–36.0)
MCV: 93 fL (ref 80.0–100.0)
Monocytes Absolute: 1 10*3/uL (ref 0.1–1.0)
Monocytes Relative: 18 %
Neutro Abs: 3.1 10*3/uL (ref 1.7–7.7)
Neutrophils Relative %: 57 %
Platelet Count: 403 10*3/uL — ABNORMAL HIGH (ref 150–400)
RBC: 2.86 MIL/uL — ABNORMAL LOW (ref 3.87–5.11)
RDW: 13.9 % (ref 11.5–15.5)
WBC Count: 5.3 10*3/uL (ref 4.0–10.5)
nRBC: 1.1 % — ABNORMAL HIGH (ref 0.0–0.2)

## 2022-10-18 LAB — MAGNESIUM: Magnesium: 1.8 mg/dL (ref 1.7–2.4)

## 2022-10-18 LAB — PHOSPHORUS: Phosphorus: 4.5 mg/dL (ref 2.5–4.6)

## 2022-10-18 LAB — SAMPLE TO BLOOD BANK

## 2022-10-18 MED ORDER — SODIUM CHLORIDE 0.9% FLUSH
10.0000 mL | Freq: Once | INTRAVENOUS | Status: AC
  Administered 2022-10-18: 10 mL

## 2022-10-18 MED ORDER — HEPARIN SOD (PORK) LOCK FLUSH 100 UNIT/ML IV SOLN
500.0000 [IU] | Freq: Once | INTRAVENOUS | Status: AC
  Administered 2022-10-18: 500 [IU]

## 2022-10-18 NOTE — Progress Notes (Signed)
Oncology Nurse Navigator Documentation   I met with Ms. Angevine today while she was here having blood drawn. I told her that I saw that she had cancelled her upcoming appointments with Verdie Mosher SLP and Milagros Loll PT. I offered her an appointment with both in the upcoming head and neck MDC on 7/18. She declined and also declined to reschedule in the future. I advised that swallowing problems and neck lymphedema sometimes develop several months after completing radiation and she is welcome to call me back to be rescheduled. I encouraged her to complete the exercises that she was taught by both Baldo Ash and Pamala Duffel.  [name] RN, BSN, OCN Head & Neck Oncology Nurse Navigator [facility] Phone # [phone]  Fax # [phone]

## 2022-10-22 ENCOUNTER — Ambulatory Visit: Payer: Self-pay | Admitting: Physical Therapy

## 2022-10-22 ENCOUNTER — Encounter: Payer: Self-pay | Admitting: Adult Health

## 2022-10-22 ENCOUNTER — Inpatient Hospital Stay (HOSPITAL_BASED_OUTPATIENT_CLINIC_OR_DEPARTMENT_OTHER): Payer: 59 | Admitting: Adult Health

## 2022-10-22 ENCOUNTER — Inpatient Hospital Stay: Payer: 59

## 2022-10-22 ENCOUNTER — Other Ambulatory Visit: Payer: Self-pay

## 2022-10-22 VITALS — BP 139/73 | HR 108 | Temp 97.5°F | Resp 18 | Wt 142.8 lb

## 2022-10-22 DIAGNOSIS — C770 Secondary and unspecified malignant neoplasm of lymph nodes of head, face and neck: Secondary | ICD-10-CM

## 2022-10-22 DIAGNOSIS — G893 Neoplasm related pain (acute) (chronic): Secondary | ICD-10-CM | POA: Diagnosis not present

## 2022-10-22 DIAGNOSIS — E43 Unspecified severe protein-calorie malnutrition: Secondary | ICD-10-CM | POA: Diagnosis not present

## 2022-10-22 DIAGNOSIS — E878 Other disorders of electrolyte and fluid balance, not elsewhere classified: Secondary | ICD-10-CM

## 2022-10-22 DIAGNOSIS — E041 Nontoxic single thyroid nodule: Secondary | ICD-10-CM

## 2022-10-22 DIAGNOSIS — C140 Malignant neoplasm of pharynx, unspecified: Secondary | ICD-10-CM | POA: Diagnosis not present

## 2022-10-22 DIAGNOSIS — Z79899 Other long term (current) drug therapy: Secondary | ICD-10-CM | POA: Diagnosis not present

## 2022-10-22 DIAGNOSIS — E860 Dehydration: Secondary | ICD-10-CM | POA: Diagnosis not present

## 2022-10-22 DIAGNOSIS — D61818 Other pancytopenia: Secondary | ICD-10-CM

## 2022-10-22 DIAGNOSIS — Z95828 Presence of other vascular implants and grafts: Secondary | ICD-10-CM

## 2022-10-22 DIAGNOSIS — D696 Thrombocytopenia, unspecified: Secondary | ICD-10-CM | POA: Diagnosis not present

## 2022-10-22 LAB — CMP (CANCER CENTER ONLY)
ALT: 8 U/L (ref 0–44)
AST: 6 U/L — ABNORMAL LOW (ref 15–41)
Albumin: 3 g/dL — ABNORMAL LOW (ref 3.5–5.0)
Alkaline Phosphatase: 94 U/L (ref 38–126)
Anion gap: 7 (ref 5–15)
BUN: 26 mg/dL — ABNORMAL HIGH (ref 8–23)
CO2: 29 mmol/L (ref 22–32)
Calcium: 8.7 mg/dL — ABNORMAL LOW (ref 8.9–10.3)
Chloride: 102 mmol/L (ref 98–111)
Creatinine: 0.83 mg/dL (ref 0.44–1.00)
GFR, Estimated: >60 mL/min (ref >60)
Glucose, Bld: 118 mg/dL — ABNORMAL HIGH (ref 70–99)
Potassium: 4.5 mmol/L (ref 3.5–5.1)
Sodium: 138 mmol/L (ref 135–145)
Total Bilirubin: 0.3 mg/dL (ref 0.3–1.2)
Total Protein: 5.9 g/dL — ABNORMAL LOW (ref 6.5–8.1)

## 2022-10-22 LAB — CBC WITH DIFFERENTIAL (CANCER CENTER ONLY)
Abs Immature Granulocytes: 0.6 10*3/uL — ABNORMAL HIGH (ref 0.00–0.07)
Basophils Absolute: 0.1 10*3/uL (ref 0.0–0.1)
Basophils Relative: 1 %
Eosinophils Absolute: 0 10*3/uL (ref 0.0–0.5)
Eosinophils Relative: 0 %
HCT: 27.7 % — ABNORMAL LOW (ref 36.0–46.0)
Hemoglobin: 9.4 g/dL — ABNORMAL LOW (ref 12.0–15.0)
Immature Granulocytes: 9 %
Lymphocytes Relative: 11 %
Lymphs Abs: 0.7 10*3/uL (ref 0.7–4.0)
MCH: 32 pg (ref 26.0–34.0)
MCHC: 33.9 g/dL (ref 30.0–36.0)
MCV: 94.2 fL (ref 80.0–100.0)
Monocytes Absolute: 1 10*3/uL (ref 0.1–1.0)
Monocytes Relative: 16 %
Neutro Abs: 4.1 10*3/uL (ref 1.7–7.7)
Neutrophils Relative %: 63 %
Platelet Count: 548 10*3/uL — ABNORMAL HIGH (ref 150–400)
RBC: 2.94 MIL/uL — ABNORMAL LOW (ref 3.87–5.11)
RDW: 15.9 % — ABNORMAL HIGH (ref 11.5–15.5)
Smear Review: NORMAL
WBC Count: 6.5 10*3/uL (ref 4.0–10.5)
nRBC: 0.5 % — ABNORMAL HIGH (ref 0.0–0.2)

## 2022-10-22 LAB — MAGNESIUM: Magnesium: 1.9 mg/dL (ref 1.7–2.4)

## 2022-10-22 LAB — SAMPLE TO BLOOD BANK

## 2022-10-22 LAB — PHOSPHORUS: Phosphorus: 4.7 mg/dL — ABNORMAL HIGH (ref 2.5–4.6)

## 2022-10-22 MED ORDER — HEPARIN SOD (PORK) LOCK FLUSH 100 UNIT/ML IV SOLN
500.0000 [IU] | Freq: Once | INTRAVENOUS | Status: AC
  Administered 2022-10-22: 500 [IU]

## 2022-10-22 MED ORDER — SODIUM CHLORIDE 0.9% FLUSH
10.0000 mL | Freq: Once | INTRAVENOUS | Status: AC
  Administered 2022-10-22: 10 mL

## 2022-10-22 NOTE — Therapy (Signed)
[facility] [address] [place], [place], [address] Phone: [phone]   Fax:  [phone]  Patient Details  Name: Nichole Dominguez MRN: [id number] Date of Birth: [birth year] Referring Provider:  No ref. provider found  Encounter Date: 10/22/2022  SPEECH THERAPY DISCHARGE SUMMARY  Visits from Start of Care: 2  Current functional level related to goals / functional outcomes: At pt's last follow up appointment with rad onc, pt told RN navigator [facility] that she does not desire any more ST follow ups. Therefore, SLP will d/c pt from ST services at this time. She can be referred again if pt desires. Goals from pt's last attended appointment are below: GOALS: Goals reviewed with patient? No   SHORT TERM GOALS: Target: 3rd total session     1.       Pt will complete HEP with modified independence in 2 sessions Baseline: Goal status: Initial     2.  pt will tell SLP why pt is completing HEP with modified independence Baseline:  Goal status: Initial     3.  pt ill describe 3 overt s/s aspiration PNA with modified independence Baseline:  Goal status: Initial     4.  pt will tell SLP how a food journal may help return to a more normalized diet Baseline:  Goal status: Initial     LONG TERM GOALS: Target: 7th total session   1.  pt will complete HEP with independence over two visits Baseline:  Goal status: Initial   2.  pt will describe how to modify HEP over time, and the timeline associated with reduction in HEP frequency with modified independence over two sessions Baseline:  Goal status: Initial   PLAN:   SLP FREQUENCY:  once approx every 4 weeks   Remaining deficits: Assumed that deficits remain, but unknown due to unexpected d/c.    Education / Equipment:  See "today's treatment" sections in pt notes for details.   Patient agrees to discharge. Patient goals were not met. Patient is being  discharged due to the patient's request..    Roland Prine, CCC-SLP 10/22/2022, 10:15 AM  [facility] [address], [address] [place], [place], [address] Phone: [phone]   Fax:  [phone]

## 2022-10-22 NOTE — Progress Notes (Unsigned)
[facility] Cancer Follow up:    Nichole Craze, NP [address] [place] [address]   DIAGNOSIS: Cancer Staging  Secondary malignant neoplasm of cervical lymph node (HCC) Staging form: Pharynx - HPV-Mediated Oropharynx, AJCC 8th Edition - Clinical stage from 08/10/2022: Stage I (cT0, cN1, cM0, p16+) - Signed by Lonie Peak, MD on 08/11/2022 Stage prefix: Initial diagnosis   SUMMARY OF ONCOLOGIC HISTORY: Oncology History  Secondary malignant neoplasm of cervical lymph node (HCC)  06/26/2022 Imaging   Neck CT: a 4.4 cm x 3.0 cm x 4.1 cm soft tissue mass centered in the right parapharyngeal space, favored to be arising from the deep lobe of the right parotid gland, 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 The mass appeared to encase a portion of the right external carotid artery and partially encases the right internal carotid artery Prominent right-sided cervical and upper mediastinal  lymph nodes were also appreciated, suspicious for metastatic spread of  disease    07/24/2022 Imaging   MRI of the face and neck  showed the infiltrative mass lesion centered in the right carotid, parapharyngeal and parotid spaces with extensive surrounding local invasion, and findings concerning for metastatic adenopathy in the right neck and additional nonspecific mildly enlarged and rounded lymph nodes involving the upper mediastinum and contralateral left jugular chain. MRI also showed evidence of tumor extension within close proximity to multiple skill base foramina, along with evidence of right hypoglossal nerve involvement contributing to right hemitongue denervation changes, and tumor thrombus within the encased right internal jugular vein. MRI otherwise showed no evidence of intracranial perineural tumor spread.    07/27/2022 Initial Biopsy   FNA of the right neck mass showed carcinoma with squamous features, favoring squamous cell carcinoma (however, other  tumors with squamous differentiation cannot entirely be excluded); P16 diffusely positive; HPV focally positive.    08/02/2022 PET scan   Whole body PET scan: the dominant right level IIa metastatic nodal mass measuring 4.9 x 2.5 cm with an SUV max of 15; multiple ipsilateral nodal metastases involving the right level 3, level 4, and level 5 lymph node stations including the dominant right level 3 nodal metastasis (measuring 1.1 cm) coming in contact with the right oropharynx without discrete primary tumor; FDG avidity within the left palatine tonsil (normal in size and with a max SUV of 4.4); and mildly FDG avid mediastinal, bihilar, and subcarinal lymph nodes. No other sites concerning for metastatic disease were appreciated elsewhere in the body.    08/10/2022 Cancer Staging   Staging form: Pharynx - HPV-Mediated Oropharynx, AJCC 8th Edition - Clinical stage from 08/10/2022: Stage I (cT0, cN1, cM0, p16+) - Signed by Lonie Peak, MD on 08/11/2022 Stage prefix: Initial diagnosis   08/21/2022 Procedure   Port placement and G-tube placement   08/23/2022 - 10/08/2022 Radiation Therapy   Concurrent radiation   08/24/2022 - 10/05/2022 Chemotherapy   Patient is on Treatment Plan : HEAD/NECK Cisplatin (40) q7d       CURRENT THERAPY: s/p concurrent chemoradiation  INTERVAL HISTORY: Nichole Dominguez 67 y.o. 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  MEDICAL HISTORY: Past Medical History:  Diagnosis Date   Allergy    GERD (gastroesophageal reflux disease)    Osteopenia 01/04/2020   Plantar fasciitis    Seasonal allergies     SURGICAL HISTORY: Past Surgical History:  Procedure Laterality Date   COLONOSCOPY  2011   IR GASTROSTOMY TUBE MOD SED  08/21/2022   IR IMAGING GUIDED PORT INSERTION  08/21/2022   IR PATIENT EVAL TECH 0-60 MINS  08/31/2022   IR PATIENT EVAL TECH 0-60 MINS  09/07/2022   MOUTH SURGERY  01/2020   implant inserted   TUBAL LIGATION  1992    SOCIAL HISTORY: Social History   Socioeconomic History   Marital status: Married    Spouse name: Not on file   Number of children: Not on file   Years of education: Not on file   Highest education level: Not on file  Occupational History   Not on file  Tobacco Use   Smoking status: Former    Current packs/day: 0.00    Average packs/day: 1 pack/day for 24.0 years (24.0 ttl pk-yrs)    Types: Cigarettes    Start date: 10/10/1972    Quit date: 10/10/1996    Years since quitting: 26.0   Smokeless tobacco: Never  Vaping Use   Vaping status: Never Used  Substance and Sexual Activity   Alcohol use: Yes    Alcohol/week: 0.0 standard drinks of alcohol    Comment: 1 shot vodka daily   Drug use: No   Sexual activity: Yes    Partners: Male  Other Topics Concern   Not on file  Social History Narrative   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    Lives with husband   1 daughter in [place], 18 grandchildren   Son died   2 grown stepchildren- both Designer, multimedia, grandchildren   Enjoys Corporate investment banker   Social Determinants of Corporate investment banker Strain: Not on file  Food Insecurity: No Food Insecurity (08/09/2022)   Hunger Vital Sign    Worried About Running Out of Food  in the Last Year: Never true    Ran Out of Food in the Last Year: Never true  Transportation Needs: No Transportation Needs (08/09/2022)   PRAPARE - Administrator, Civil Service (Medical): No    Lack of Transportation (Non-Medical): No  Physical Activity: Not on file  Stress: Not on file  Social Connections: Not on file  Intimate Partner Violence: Not At Risk (08/09/2022)   Humiliation, Afraid, Rape, and Kick questionnaire    Fear of Current or Ex-Partner: No    Emotionally Abused: No    Physically Abused: No    Sexually Abused: No    FAMILY HISTORY: Family History  Problem Relation Age of Onset   Diabetes Mother    Heart disease Mother        chf   Hypertension Mother    Cervical cancer Mother    Obesity Mother    Colon polyps Mother    Thyroid cancer Father    Cancer Father        thyroid x2   Heart attack Father  Breast cancer Sister    Lupus Sister    Obesity Maternal Grandmother    Diabetes Maternal Grandmother    Heart disease Maternal Grandmother    Hypertension Maternal Grandmother    Stroke Maternal Grandmother    Diabetes Mellitus II Paternal Grandmother        died from diabetic coma   Cancer Paternal Grandfather        not sure what type   Heart attack Son    Esophageal cancer Neg Hx    Stomach cancer Neg Hx    Rectal cancer Neg Hx    Colon cancer Neg Hx     Review of Systems  Constitutional:  Negative for appetite change, chills, fatigue, fever and unexpected weight change.  HENT:   Negative for hearing loss, lump/mass and trouble swallowing.   Eyes:  Negative for eye problems and icterus.  Respiratory:  Negative for chest tightness, cough and shortness of breath.   Cardiovascular:  Negative for chest pain, leg swelling and palpitations.  Gastrointestinal:  Negative for abdominal distention, abdominal pain, constipation, diarrhea, nausea and vomiting.  Endocrine: Negative for hot flashes.  Genitourinary:  Negative for difficulty  urinating.   Musculoskeletal:  Negative for arthralgias.  Skin:  Negative for itching and rash.  Neurological:  Negative for dizziness, extremity weakness, headaches and numbness.  Hematological:  Negative for adenopathy. Does not bruise/bleed easily.  Psychiatric/Behavioral:  Negative for depression. The patient is not nervous/anxious.       PHYSICAL EXAMINATION    There were no vitals filed for this visit.  Physical Exam Constitutional:      General: She is not in acute distress.    Appearance: Normal appearance. She is not toxic-appearing.  HENT:     Head: Normocephalic and atraumatic.     Mouth/Throat:     Mouth: Mucous membranes are moist.     Pharynx: Oropharynx is clear. No oropharyngeal exudate or posterior oropharyngeal erythema.  Eyes:     General: No scleral icterus. Cardiovascular:     Rate and Rhythm: Normal rate and regular rhythm.     Pulses: Normal pulses.     Heart sounds: Normal heart sounds.  Pulmonary:     Effort: Pulmonary effort is normal.     Breath sounds: Normal breath sounds.  Abdominal:     General: Abdomen is flat. Bowel sounds are normal. There is no distension.     Palpations: Abdomen is soft.     Tenderness: There is no abdominal tenderness.  Musculoskeletal:        General: No swelling.     Cervical back: Neck supple.  Lymphadenopathy:     Cervical: No cervical adenopathy.  Skin:    General: Skin is warm and dry.     Findings: No rash.  Neurological:     General: No focal deficit present.     Mental Status: She is alert.  Psychiatric:        Mood and Affect: Mood normal.        Behavior: Behavior normal.     LABORATORY DATA:  CBC    Component Value Date/Time   WBC 5.3 10/18/2022 1347   WBC 7.7 08/21/2022 1000   RBC 2.86 (L) 10/18/2022 1347   HGB 8.9 (L) 10/18/2022 1347   HCT 26.6 (L) 10/18/2022 1347   PLT 403 (H) 10/18/2022 1347   MCV 93.0 10/18/2022 1347   MCH 31.1 10/18/2022 1347   MCHC 33.5 10/18/2022 1347   RDW  13.9 10/18/2022 1347  LYMPHSABS 0.6 (L) 10/18/2022 1347   MONOABS 1.0 10/18/2022 1347   EOSABS 0.0 10/18/2022 1347   BASOSABS 0.1 10/18/2022 1347    CMP     Component Value Date/Time   NA 135 10/18/2022 1347   K 4.6 10/18/2022 1347   CL 100 10/18/2022 1347   CO2 27 10/18/2022 1347   GLUCOSE 114 (H) 10/18/2022 1347   BUN 18 10/18/2022 1347   CREATININE 0.79 10/18/2022 1347   CREATININE 1.02 (H) 12/28/2019 0749   CALCIUM 8.6 (L) 10/18/2022 1347   PROT 5.8 (L) 10/18/2022 1347   ALBUMIN 2.8 (L) 10/18/2022 1347   AST 8 (L) 10/18/2022 1347   ALT 10 10/18/2022 1347   ALKPHOS 98 10/18/2022 1347   BILITOT 0.3 10/18/2022 1347   GFRNONAA >60 10/18/2022 1347       ASSESSMENT and THERAPY PLAN:   No problem-specific Assessment & Plan notes found for this encounter.    All questions were answered. The patient knows to call the clinic with any problems, questions or concerns. We can certainly see the patient much sooner if necessary.  Total encounter time:*** minutes*in face-to-face visit time, chart review, lab review, care coordination, order entry, and documentation of the encounter time.    Lillard Anes, NP 10/22/22 2:39 PM Medical Oncology and Hematology [facility] [address] [place], [place] [address] Tel. [phone]    Fax. [phone]  *Total Encounter Time as defined by the Centers for Medicare and Medicaid Services includes, in addition to the face-to-face time of a patient visit (documented in the note above) non-face-to-face time: obtaining and reviewing outside history, ordering and reviewing medications, tests or procedures, care coordination (communications with other health care professionals or caregivers) and documentation in the medical record.

## 2022-10-23 ENCOUNTER — Encounter: Payer: Self-pay | Admitting: Hematology and Oncology

## 2022-10-23 NOTE — Assessment & Plan Note (Addendum)
She is status post chemoradiation and continues to heal.  She is still feeling slightly fatigued however overall is quite improved from just a couple weeks ago.  Her labs are stable and I reviewed this with her in detail.

## 2022-10-23 NOTE — Assessment & Plan Note (Signed)
Currently this is managed with 2 fentanyl patches and breakthrough Roxanol.  I encouraged her when it is time to change her fentanyl patches to only apply 1 instead of 2 to see if we can titrate it off.

## 2022-10-23 NOTE — Assessment & Plan Note (Signed)
This is resolved today.  Her magnesium level is normal.  She is very happy to hear this.

## 2022-10-23 NOTE — Assessment & Plan Note (Signed)
Her continuous tube feeds currently at a rate of 50 mL/hr. she is not receiving any IV fluids.  Her BUN is slightly elevated therefore I recommended that she get an additional free water of 240 mL a day.  She returns next week for follow-up with Dr. Al Pimple.  Care wants to avoid IV fluids.

## 2022-10-24 DIAGNOSIS — C140 Malignant neoplasm of pharynx, unspecified: Secondary | ICD-10-CM | POA: Diagnosis not present

## 2022-10-26 ENCOUNTER — Inpatient Hospital Stay: Payer: 59 | Admitting: Dietician

## 2022-10-26 ENCOUNTER — Encounter: Payer: Self-pay | Admitting: Radiation Oncology

## 2022-10-26 ENCOUNTER — Ambulatory Visit
Admission: RE | Admit: 2022-10-26 | Discharge: 2022-10-26 | Disposition: A | Discharge status: Home / Self Care | Payer: 59 | Source: Ambulatory Visit | Attending: Radiation Oncology | Admitting: Radiation Oncology

## 2022-10-26 ENCOUNTER — Other Ambulatory Visit: Payer: Self-pay

## 2022-10-26 VITALS — BP 116/69 | HR 112 | Temp 97.1°F | Resp 18 | Ht 61.0 in | Wt 141.2 lb

## 2022-10-26 DIAGNOSIS — Z923 Personal history of irradiation: Secondary | ICD-10-CM | POA: Diagnosis not present

## 2022-10-26 DIAGNOSIS — C140 Malignant neoplasm of pharynx, unspecified: Secondary | ICD-10-CM | POA: Diagnosis not present

## 2022-10-26 DIAGNOSIS — C801 Malignant (primary) neoplasm, unspecified: Secondary | ICD-10-CM | POA: Insufficient documentation

## 2022-10-26 DIAGNOSIS — Z79899 Other long term (current) drug therapy: Secondary | ICD-10-CM | POA: Diagnosis not present

## 2022-10-26 DIAGNOSIS — C770 Secondary and unspecified malignant neoplasm of lymph nodes of head, face and neck: Secondary | ICD-10-CM | POA: Diagnosis not present

## 2022-10-26 NOTE — Progress Notes (Signed)
Radiation Oncology         [phone] ________________________________  Name: Nichole Dominguez MRN: [id number]  Date: 10/26/2022  DOB: [birth year]  Follow-Up Visit Note  CC: Nichole Craze, NP  Nichole Craze, NP  Diagnosis and Prior Radiotherapy:       ICD-10-CM   1. 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 Secondary malignant neoplasm of cervical lymph node (HCC)  C77.0      Cancer Staging  Secondary malignant neoplasm of cervical lymph node (HCC) Staging form: Pharynx - HPV-Mediated Oropharynx, AJCC 8th Edition - Clinical stage from 08/10/2022: Stage I (cT0, cN1, cM0, p16+) - Signed by Nichole Peak, MD on 08/11/2022 Stage prefix: Initial diagnosis   First Treatment Date: 2022-08-20 - Last Treatment Date: 2022-10-08   Plan Name: HN_Pharynx Site: Pharynx Technique: IMRT Mode: Photon Dose Per Fraction: 2 Gy Prescribed Dose (Delivered / Prescribed): 70 Gy / 70 Gy Prescribed Fxs (Delivered / Prescribed): 35 / 35   CHIEF COMPLAINT:  Here for follow-up and surveillance of neck cancer  Narrative:  The patient returns today for routine follow-up.  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 She completed treatment on 10-08-22.  Pain issues, if any: Denies any mouth or throat, and reports she has weaned down from 2 fentanyl patches to 1.  Using a feeding tube?: Yes--continues to tolerate continuous tube feeds, but does report continued nausea and reflux Weight changes, if any:  Wt Readings from Last 3 Encounters:  10/26/22 141 lb 4 oz (64.1 kg)  10/22/22 142 lb 12.8 oz (64.8 kg)  10/15/22 151 lb 12.8 oz (68.9 kg)   Swallowing issues, if any: Denies, but currently only taking medications and small amounts of liquids by mouth. She declined further SLP visits per last note (10/22/2022) Smoking or chewing tobacco? None Using fluoride toothpaste daily? Yes--denies any dental concerns or mouth sores Last ENT visit was on: Not  since diagnosis Other notable issues, if any: Denies any jaw or ear pain, or difficulty opening her mouth. Denies any swelling or new lumps/bumps under her chin or down her neck. Skin still remains tanned in treatment area with a few areas of dry peeling.                    ALLERGIES:  is allergic to amlodipine, darvon [propoxyphene], and sudafed [pseudoephedrine hcl].  Meds: Current Outpatient Medications  Medication Sig Dispense Refill   prochlorperazine (COMPAZINE) 10 MG tablet Take 10 mg by mouth every 6 (six) hours as needed.     acetaminophen (TYLENOL) 500 MG tablet Take 1,500 mg by mouth every 6 (six) hours as needed. (Patient not taking: Reported on 10/22/2022)     augmented betamethasone dipropionate (DIPROLENE-AF) 0.05 % ointment Apply topically 2 (two) times daily. (Patient not taking: Reported on 10/22/2022) 15 g 0   Calcium Carbonate (CALCIUM 600 HIGH POTENCY PO) Take 3 capsules by mouth daily.     cholecalciferol (VITAMIN D3) 25 MCG (1000 UNIT) tablet Take 1,000 Units by mouth daily. (Patient not taking: Reported on 08/09/2022)     fentaNYL (DURAGESIC) 12 MCG/HR Place 2 patches onto the skin every 3 (three) days as directed. 10 patch 0   fluticasone (FLONASE) 50 MCG/ACT nasal spray USE 2 SPRAYS IN EACH NOSTRIL DAILY 48 g 3   losartan (COZAAR) 50 MG tablet Take 1 tablet (50 mg total) by mouth daily. 90 tablet 1   morphine (ROXANOL) 20 MG/ML concentrated solution  Take 1 mL by mouth every 4 hours as needed for severe pain. 240 mL 0   Multiple Vitamins-Minerals (MULTIVITAMIN WITH MINERALS) tablet Take 1 tablet by mouth daily. (Patient not taking: Reported on 08/09/2022)     Nutritional Supplements (KATE FARMS STANDARD 1.4) LIQD Change tube feeding to [facility] 1.4 via PEG at 20 mL an hour continuously.  If tolerated for 24 hours, increase 10 mL every 8 hours to goal rate of 68 mL/h over 24 hours.  Flush feeding tube with 120 mL free water before and after starting and stopping continuous  feedings.  In addition give 220 mL free water 4 times a day.  Provides 2275 cal, 100 g protein, 2275 mL free water/100% estimated needs.  Send pump and 2 cases of formula. 1625 mL 6   omeprazole (PRILOSEC) 20 MG capsule TAKE 1 CAPSULE (20 MG TOTAL) BY MOUTH EVERY MORNING. 90 capsule 1   ondansetron (ZOFRAN-ODT) 8 MG disintegrating tablet Take 1 tablet (8 mg total) by mouth every 8 (eight) hours as needed for nausea or vomiting. 20 tablet 0   No current facility-administered medications for this encounter.    Physical Findings: The patient is in no acute distress. Patient is alert and oriented. Wt Readings from Last 3 Encounters:  10/26/22 141 lb 4 oz (64.1 kg)  10/22/22 142 lb 12.8 oz (64.8 kg)  10/15/22 151 lb 12.8 oz (68.9 kg)    height is 5\' 1"  (1.549 m) and weight is 141 lb 4 oz (64.1 kg). Her temporal temperature is 97.1 F (36.2 C) (abnormal). Her blood pressure is 116/69 and her pulse is 112 (abnormal). Her respiration is 18 and oxygen saturation is 92%. .  General: Alert and oriented, in no acute distress HEENT: Head is normocephalic. Extraocular movements are intact. Oropharynx is notable for no mucositis, tumor, or thrush Neck: Neck is notable for no masses Skin: Skin in treatment fields shows  very dry skin over neck Lymphatics: see Neck Exam Psychiatric: Judgment and insight are intact. Affect is appropriate.   Lab Findings: Lab Results  Component Value Date   WBC 6.5 10/22/2022   HGB 9.4 (L) 10/22/2022   HCT 27.7 (L) 10/22/2022   MCV 94.2 10/22/2022   PLT 548 (H) 10/22/2022    Lab Results  Component Value Date   TSH 1.515 08/10/2022    Radiographic Findings: No results found.  Impression/Plan:    1) Head and Neck Cancer Status: healing well from RT with complete regression of neck masses per exam  2) Nutritional Status:  Wt Readings from Last 3 Encounters:  10/26/22 141 lb 4 oz (64.1 kg)  10/22/22 142 lb 12.8 oz (64.8 kg)  10/15/22 151 lb 12.8 oz (68.9  kg)   Will see Nichole Dominguez, RD today PEG tube: uses for all calories  3) Risk Factors: The patient has been educated about risk factors including alcohol and tobacco abuse; they understand that avoidance of alcohol and tobacco is important to prevent recurrences as well as other cancers  4) Swallowing: only swallowing water, no food or shakes.  Declined further SLP.  Urged to resume, as this will give her the best chance of weaning off PEG tube (which is causing significant issues including reflux, nausea) and help prevent late dysphagia as a long term SE of treatment.  She seems to now be agreeable, and we will re-refer  5) Dental: Encouraged to continue regular followup with dentistry, and dental hygiene including fluoride rinses.   6) Thyroid function:  Lab Results  Component Value Date   TSH 1.515 08/10/2022    7) Other: follow-up in 2.5 months after PET. The patient was encouraged to call with any issues or questions before then.  8) Emotional support: normalized her feelings and frustrations. This treatment course has been physically and emotionally taxing.  On date of service, in total, I spent 30 minutes on this encounter. Patient was seen in person. _____________________________________   Nichole Peak, MD

## 2022-10-26 NOTE — Progress Notes (Signed)
Oncology Nurse Navigator Documentation   I met with Nichole Dominguez before and during her follow up with Dr. Basilio Cairo today. She is still feeling poorly but does admit to some improvement since completing treatment 2 weeks ago. She is getting continuous tube feeds but still reports relux and nausea. She will see Nutrition after her follow up with Dr. Basilio Cairo today. She has agreed to see Verdie Mosher SLP for follow up to help with her swallowing and I have sent a message to him and his scheduler to get her set up. She and her husband know to call me if I can help them with any questions or concerns they may have.  [name] RN, BSN, OCN Head & Neck Oncology Nurse Navigator [facility] Phone # [phone]  Fax # [phone]

## 2022-10-26 NOTE — Progress Notes (Signed)
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 Patient reports ongoing nausea. Patient endorses episode of vomiting after gagging on thick saliva. She is doing baking soda salt water rinses. She is giving 6 cartons KF via pump @ 61 ml/hr x 24 hours. Patient has not tried eating orally. Reports she will have to start cooking if she eats. She is sure that everything is going to taste horrible. Patient denies sore throat or pain with swallowing. Reports toothpaste globs up in her mouth. Patient is agreeable to try a piece of hard candy. This did not taste bad.   Medications: reviewed   Labs: 7/15 labs reviewed   Anthropometrics: Wt 141 lb 4 oz today slightly decreased from 143 lb 12.8 oz on 7/1  Estimated Energy Needs   Kcals: 2115-2400 Protein: 92-106 Fluid: >2.1 L   NUTRITION DIAGNOSIS: Predicted suboptimal intake continues - TF advancing towards goal   INTERVENTION:  Pt will start eating/drinking orally. She is agreeable to trying bites 6x/day Continue taking antiemetics  Continue Molli Posey 1.4 @ 61 ml/hr Work to wean pt from continuous feeds Educated on importance of HEP and encouraged pt to reschedule appointment with SLP     MONITORING, EVALUATION, GOAL: weight trends, intake, TF   NEXT VISIT: Wednesday July 24 via telephone

## 2022-10-29 ENCOUNTER — Encounter: Payer: Self-pay | Admitting: Hematology and Oncology

## 2022-10-29 DIAGNOSIS — C140 Malignant neoplasm of pharynx, unspecified: Secondary | ICD-10-CM | POA: Diagnosis not present

## 2022-10-31 ENCOUNTER — Inpatient Hospital Stay: Payer: 59 | Admitting: Hematology and Oncology

## 2022-10-31 ENCOUNTER — Inpatient Hospital Stay: Payer: 59

## 2022-10-31 ENCOUNTER — Telehealth: Payer: Self-pay | Admitting: Dietician

## 2022-10-31 ENCOUNTER — Other Ambulatory Visit (HOSPITAL_COMMUNITY): Payer: Self-pay

## 2022-10-31 ENCOUNTER — Inpatient Hospital Stay: Payer: 59 | Admitting: Dietician

## 2022-10-31 ENCOUNTER — Other Ambulatory Visit: Payer: Self-pay

## 2022-10-31 DIAGNOSIS — D696 Thrombocytopenia, unspecified: Secondary | ICD-10-CM | POA: Diagnosis not present

## 2022-10-31 DIAGNOSIS — C140 Malignant neoplasm of pharynx, unspecified: Secondary | ICD-10-CM | POA: Diagnosis not present

## 2022-10-31 DIAGNOSIS — T451X5A Adverse effect of antineoplastic and immunosuppressive drugs, initial encounter: Secondary | ICD-10-CM

## 2022-10-31 DIAGNOSIS — E041 Nontoxic single thyroid nodule: Secondary | ICD-10-CM

## 2022-10-31 DIAGNOSIS — E860 Dehydration: Secondary | ICD-10-CM | POA: Diagnosis not present

## 2022-10-31 DIAGNOSIS — Z95828 Presence of other vascular implants and grafts: Secondary | ICD-10-CM

## 2022-10-31 DIAGNOSIS — D61818 Other pancytopenia: Secondary | ICD-10-CM

## 2022-10-31 DIAGNOSIS — R112 Nausea with vomiting, unspecified: Secondary | ICD-10-CM

## 2022-10-31 DIAGNOSIS — E878 Other disorders of electrolyte and fluid balance, not elsewhere classified: Secondary | ICD-10-CM

## 2022-10-31 DIAGNOSIS — C770 Secondary and unspecified malignant neoplasm of lymph nodes of head, face and neck: Secondary | ICD-10-CM | POA: Diagnosis not present

## 2022-10-31 DIAGNOSIS — Z79899 Other long term (current) drug therapy: Secondary | ICD-10-CM | POA: Diagnosis not present

## 2022-10-31 LAB — CMP (CANCER CENTER ONLY)
ALT: 9 U/L (ref 0–44)
AST: 6 U/L — ABNORMAL LOW (ref 15–41)
Albumin: 3.2 g/dL — ABNORMAL LOW (ref 3.5–5.0)
Alkaline Phosphatase: 75 U/L (ref 38–126)
Anion gap: 6 (ref 5–15)
BUN: 22 mg/dL (ref 8–23)
CO2: 28 mmol/L (ref 22–32)
Calcium: 8.9 mg/dL (ref 8.9–10.3)
Chloride: 103 mmol/L (ref 98–111)
Creatinine: 0.78 mg/dL (ref 0.44–1.00)
GFR, Estimated: >60 mL/min (ref >60)
Glucose, Bld: 113 mg/dL — ABNORMAL HIGH (ref 70–99)
Potassium: 4.3 mmol/L (ref 3.5–5.1)
Sodium: 137 mmol/L (ref 135–145)
Total Bilirubin: 0.3 mg/dL (ref 0.3–1.2)
Total Protein: 6 g/dL — ABNORMAL LOW (ref 6.5–8.1)

## 2022-10-31 LAB — CBC WITH DIFFERENTIAL (CANCER CENTER ONLY)
Abs Immature Granulocytes: 0.47 10*3/uL — ABNORMAL HIGH (ref 0.00–0.07)
Basophils Absolute: 0.2 10*3/uL — ABNORMAL HIGH (ref 0.0–0.1)
Basophils Relative: 2 %
Eosinophils Absolute: 0.1 10*3/uL (ref 0.0–0.5)
Eosinophils Relative: 1 %
HCT: 30.1 % — ABNORMAL LOW (ref 36.0–46.0)
Hemoglobin: 10.1 g/dL — ABNORMAL LOW (ref 12.0–15.0)
Immature Granulocytes: 5 %
Lymphocytes Relative: 7 %
Lymphs Abs: 0.7 10*3/uL (ref 0.7–4.0)
MCH: 32.5 pg (ref 26.0–34.0)
MCHC: 33.6 g/dL (ref 30.0–36.0)
MCV: 96.8 fL (ref 80.0–100.0)
Monocytes Absolute: 1.1 10*3/uL — ABNORMAL HIGH (ref 0.1–1.0)
Monocytes Relative: 11 %
Neutro Abs: 7.5 10*3/uL (ref 1.7–7.7)
Neutrophils Relative %: 74 %
Platelet Count: 426 10*3/uL — ABNORMAL HIGH (ref 150–400)
RBC: 3.11 MIL/uL — ABNORMAL LOW (ref 3.87–5.11)
RDW: 16.7 % — ABNORMAL HIGH (ref 11.5–15.5)
WBC Count: 10 10*3/uL (ref 4.0–10.5)
nRBC: 0 % (ref 0.0–0.2)

## 2022-10-31 LAB — PHOSPHORUS: Phosphorus: 4.6 mg/dL (ref 2.5–4.6)

## 2022-10-31 LAB — SAMPLE TO BLOOD BANK

## 2022-10-31 LAB — MAGNESIUM: Magnesium: 1.8 mg/dL (ref 1.7–2.4)

## 2022-10-31 MED ORDER — SODIUM CHLORIDE 0.9% FLUSH
10.0000 mL | Freq: Once | INTRAVENOUS | Status: AC
  Administered 2022-10-31: 10 mL

## 2022-10-31 MED ORDER — ONDANSETRON 8 MG PO TBDP
8.0000 mg | ORAL_TABLET | Freq: Three times a day (TID) | ORAL | 0 refills | Status: DC | PRN
Start: 2022-10-31 — End: 2022-12-19
  Filled 2022-10-31: qty 18, 6d supply, fill #0

## 2022-10-31 MED ORDER — HEPARIN SOD (PORK) LOCK FLUSH 100 UNIT/ML IV SOLN
500.0000 [IU] | Freq: Once | INTRAVENOUS | Status: AC
  Administered 2022-10-31: 500 [IU]

## 2022-10-31 NOTE — Telephone Encounter (Signed)
Brief Nutrition Follow-up completed with patient via telephone.  States she has been trying to eat at least once a day. Recalls having a bowl of grits this morning. States they were "not the worst, but definitely not great."  In the last week she has tried rice, sweet and sour chicken, peanut butter, mashed potatoes, toast with butter, baked ham, pistachios. She does not like any of these besides the pistachios. States the first couple bites of foods are fine, then it taste horrible. She has not been doing baking soda salt water rinses.   Patient met with Dr. Al Pimple for follow-up. Per pt, Dr. Al Pimple has recommended her to return to bolus feeds and discontinue pump. Patient is agreeable to this plan. States her goal is to be completely off tube by September and asks if this is a realistic goal.   Patient agreeable to eat one meal daily. She will bolus 5 cartons [employer] 1.4 via tube with goal of decreasing by one carton weekly as oral intake improves.   Encouraged patient to do baking soda salt water rinses several times daily before meals.   Will follow-up via telephone ~ one week.

## 2022-10-31 NOTE — Progress Notes (Signed)
[facility] Cancer Follow up:    Nichole Craze, NP [address] [place] [address]   DIAGNOSIS:  Cancer Staging  Secondary malignant neoplasm of cervical lymph node (HCC) Staging form: Pharynx - HPV-Mediated Oropharynx, AJCC 8th Edition - Clinical stage from 08/10/2022: Stage I (cT0, cN1, cM0, p16+) - Signed by Lonie Peak, MD on 08/11/2022 Stage prefix: Initial diagnosis   SUMMARY OF ONCOLOGIC HISTORY: Oncology History  Secondary malignant neoplasm of cervical lymph node (HCC)  06/26/2022 Imaging   Neck CT: a 4.4 cm x 3.0 cm x 4.1 cm soft tissue mass centered in the right parapharyngeal space, favored to be arising from the deep lobe of the right parotid gland, 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 The mass appeared to encase a portion of the right external carotid artery and partially encases the right internal carotid artery Prominent right-sided cervical and upper mediastinal  lymph nodes were also appreciated, suspicious for metastatic spread of  disease    07/24/2022 Imaging   MRI of the face and neck  showed the infiltrative mass lesion centered in the right carotid, parapharyngeal and parotid spaces with extensive surrounding local invasion, and findings concerning for metastatic adenopathy in the right neck and additional nonspecific mildly enlarged and rounded lymph nodes involving the upper mediastinum and contralateral left jugular chain. MRI also showed evidence of tumor extension within close proximity to multiple skill base foramina, along with evidence of right hypoglossal nerve involvement contributing to right hemitongue denervation changes, and tumor thrombus within the encased right internal jugular vein. MRI otherwise showed no evidence of intracranial perineural tumor spread.    07/27/2022 Initial Biopsy   FNA of the right neck mass showed carcinoma with squamous features, favoring squamous cell carcinoma (however,  other tumors with squamous differentiation cannot entirely be excluded); P16 diffusely positive; HPV focally positive.    08/02/2022 PET scan   Whole body PET scan: the dominant right level IIa metastatic nodal mass measuring 4.9 x 2.5 cm with an SUV max of 15; multiple ipsilateral nodal metastases involving the right level 3, level 4, and level 5 lymph node stations including the dominant right level 3 nodal metastasis (measuring 1.1 cm) coming in contact with the right oropharynx without discrete primary tumor; FDG avidity within the left palatine tonsil (normal in size and with a max SUV of 4.4); and mildly FDG avid mediastinal, bihilar, and subcarinal lymph nodes. No other sites concerning for metastatic disease were appreciated elsewhere in the body.    08/10/2022 Cancer Staging   Staging form: Pharynx - HPV-Mediated Oropharynx, AJCC 8th Edition - Clinical stage from 08/10/2022: Stage I (cT0, cN1, cM0, p16+) - Signed by Lonie Peak, MD on 08/11/2022 Stage prefix: Initial diagnosis   08/21/2022 Procedure   Port placement and G-tube placement   08/23/2022 - 10/08/2022 Radiation Therapy   Concurrent radiation   08/24/2022 - 10/05/2022 Chemotherapy   Patient is on Treatment Plan : HEAD/NECK Cisplatin (40) q7d       CURRENT THERAPY: chemoradiation  INTERVAL HISTORY: Nichole Dominguez 67 y.o. female returns for follow up.  She is here today with her husband.  Since her last visit here, she feels so much better.  Her pain is very well-controlled.  She has not been taking any pain medication.  She is still having continuous tube feeds, trying to eat although nothing tastes good.  She says she has not been eating much by mouth.  She requests a refill for Zofran.  Her energy levels are better.  Overall she is making progress.  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  MEDICAL HISTORY: Past Medical History:  Diagnosis Date   Allergy    GERD (gastroesophageal reflux disease)    Osteopenia 01/04/2020   Plantar fasciitis    Seasonal allergies     SURGICAL HISTORY: Past Surgical History:  Procedure Laterality Date   COLONOSCOPY  2011   IR GASTROSTOMY TUBE MOD SED  08/21/2022   IR IMAGING GUIDED PORT INSERTION  08/21/2022   IR PATIENT EVAL TECH 0-60 MINS  08/31/2022   IR PATIENT EVAL TECH 0-60 MINS  09/07/2022   MOUTH SURGERY  01/2020   implant inserted   TUBAL LIGATION  1992    SOCIAL HISTORY: Social History   Socioeconomic History   Marital status: Married    Spouse name: Not on file   Number of children: Not on file   Years of education: Not on file   Highest education level: Not on file  Occupational History   Not on file  Tobacco Use   Smoking status: Former    Current packs/day: 0.00    Average packs/day: 1 pack/day for 24.0 years (24.0 ttl pk-yrs)    Types: Cigarettes    Start date: 10/10/1972    Quit date: 10/10/1996    Years since quitting: 26.0   Smokeless tobacco: Never  Vaping Use   Vaping status: Never Used  Substance and Sexual Activity   Alcohol use: Yes    Alcohol/week: 0.0 standard drinks of alcohol    Comment: 1 shot vodka daily   Drug use: No   Sexual activity: Yes     Partners: Male  Other Topics Concern   Not on file  Social History Narrative   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    Lives with husband   1 daughter in [place], 66 grandchildren   Son died   2 grown stepchildren- both Designer, multimedia, grandchildren   Enjoys Corporate investment banker   Social Determinants of Corporate investment banker Strain: Not on file  Food Insecurity: No Food Insecurity (08/09/2022)   Hunger Vital Sign    Worried About Running Out of Food in the Last Year: Never true    Ran Out of Food in the Last Year: Never true  Transportation Needs: No Transportation Needs (08/09/2022)   PRAPARE - Administrator, Civil Service (Medical): No    Lack of Transportation (Non-Medical): No  Physical Activity: Not on file  Stress: Not on file  Social Connections: Not on file  Intimate Partner Violence: Not At Risk (08/09/2022)   Humiliation, Afraid, Rape, and Kick questionnaire    Fear of Current or Ex-Partner: No    Emotionally Abused: No  Physically Abused: No    Sexually Abused: No    FAMILY HISTORY: Family History  Problem Relation Age of Onset   Diabetes Mother    Heart disease Mother        chf   Hypertension Mother    Cervical cancer Mother    Obesity Mother    Colon polyps Mother    Thyroid cancer Father    Cancer Father        thyroid x2   Heart attack Father    Breast cancer Sister    Lupus Sister    Obesity Maternal Grandmother    Diabetes Maternal Grandmother    Heart disease Maternal Grandmother    Hypertension Maternal Grandmother    Stroke Maternal Grandmother    Diabetes Mellitus II Paternal Grandmother        died from diabetic coma   Cancer Paternal Grandfather        not sure what type   Heart attack Son    Esophageal cancer Neg Hx    Stomach cancer Neg Hx    Rectal cancer Neg Hx    Colon cancer Neg Hx         PHYSICAL EXAMINATION  See [facility] for vitals from today's visit  Physical Exam Constitutional:      General: She is  not in acute distress.    Appearance: Normal appearance. She is not ill-appearing or toxic-appearing.  HENT:     Head: Normocephalic and atraumatic.     Mouth/Throat:     Mouth: Mucous membranes are moist.     Pharynx: Oropharynx is clear. No oropharyngeal exudate or posterior oropharyngeal erythema.  Eyes:     General: No scleral icterus. Cardiovascular:     Rate and Rhythm: Normal rate and regular rhythm.     Pulses: Normal pulses.     Heart sounds: Normal heart sounds.  Pulmonary:     Effort: Pulmonary effort is normal.     Breath sounds: Normal breath sounds.  Abdominal:     General: Abdomen is flat. Bowel sounds are normal. There is no distension.     Palpations: Abdomen is soft.     Tenderness: There is no abdominal tenderness.  Musculoskeletal:        General: No swelling.     Cervical back: Neck supple.  Lymphadenopathy:     Cervical: No cervical adenopathy.  Skin:    General: Skin is warm and dry.     Findings: No rash.  Neurological:     General: No focal deficit present.     Mental Status: She is alert.  Psychiatric:        Mood and Affect: Mood normal.        Behavior: Behavior normal.     LABORATORY DATA:  CBC    Component Value Date/Time   WBC 10.0 10/31/2022 0806   WBC 7.7 08/21/2022 1000   RBC 3.11 (L) 10/31/2022 0806   HGB 10.1 (L) 10/31/2022 0806   HCT 30.1 (L) 10/31/2022 0806   PLT 426 (H) 10/31/2022 0806   MCV 96.8 10/31/2022 0806   MCH 32.5 10/31/2022 0806   MCHC 33.6 10/31/2022 0806   RDW 16.7 (H) 10/31/2022 0806   LYMPHSABS 0.7 10/31/2022 0806   MONOABS 1.1 (H) 10/31/2022 0806   EOSABS 0.1 10/31/2022 0806   BASOSABS 0.2 (H) 10/31/2022 0806    CMP     Component Value Date/Time   NA 138 10/22/2022 1502   K 4.5 10/22/2022 1502   CL  102 10/22/2022 1502   CO2 29 10/22/2022 1502   GLUCOSE 118 (H) 10/22/2022 1502   BUN 26 (H) 10/22/2022 1502   CREATININE 0.83 10/22/2022 1502   CREATININE 1.02 (H) 12/28/2019 0749   CALCIUM 8.7 (L)  10/22/2022 1502   PROT 5.9 (L) 10/22/2022 1502   ALBUMIN 3.0 (L) 10/22/2022 1502   AST 6 (L) 10/22/2022 1502   ALT 8 10/22/2022 1502   ALKPHOS 94 10/22/2022 1502   BILITOT 0.3 10/22/2022 1502   GFRNONAA >60 10/22/2022 1502         ASSESSMENT and THERAPY PLAN:   This is a very pleasant 67 year old female patient with no significant past medical history referred to medical oncology for new diagnosis of squamous cell carcinoma, unknown primary, HPV positive for consideration of concurrent chemoradiation.   She has mass lesion centered in the right carotid.   Her mass is centered in the right carotid, parapharyngeal and parotid spaces with extensive surrounding local invasion and extensive nodal involvement biopsy favor squamous cell carcinoma without a clear primary, p16 diffusely positive HPV focally positive. She completed chemoradiation and has been doing quite well.  Since her last visit here, she did not need any more pain medication.  She is still relying on the G-tube for feeding.  She is not able to eat much mostly because of dysgeusia.  We have encouraged her to start eating some food by mouth and hopefully we can transition the continuous tube feeds to intermittent bolus feeding.  I have recommended that she continue Zofran as needed.  She can return to clinic in the next 8 weeks approximately to check back on the G-tube status.  When she becomes independent of the G-tube and can maintain her weight for at least 2 weeks, we can certainly remove the G-tube.  She will also follow-up with Dr. Basilio Cairo for PET/CT results and in October.  She will follow-up with Dr. Vella Redhead for ENT surveillance.  All questions were answered. The patient knows to call the clinic with any problems, questions or concerns. We can certainly see the patient much sooner if necessary.  Total encounter time:30 minutes*in face-to-face visit time, chart review, lab review, care coordination, order entry, and  documentation of the encounter time.  *Total Encounter Time as defined by the Centers for Medicare and Medicaid Services includes, in addition to the face-to-face time of a patient visit (documented in the note above) non-face-to-face time: obtaining and reviewing outside history, ordering and reviewing medications, tests or procedures, care coordination (communications with other health care professionals or caregivers) and documentation in the medical record.

## 2022-11-02 ENCOUNTER — Other Ambulatory Visit: Payer: Self-pay

## 2022-11-02 DIAGNOSIS — C770 Secondary and unspecified malignant neoplasm of lymph nodes of head, face and neck: Secondary | ICD-10-CM

## 2022-11-08 ENCOUNTER — Ambulatory Visit: Payer: Medicare Other | Attending: Radiation Oncology

## 2022-11-08 ENCOUNTER — Other Ambulatory Visit: Payer: Self-pay

## 2022-11-08 ENCOUNTER — Encounter: Payer: Self-pay | Admitting: Hematology and Oncology

## 2022-11-08 ENCOUNTER — Encounter: Payer: Self-pay | Admitting: Family

## 2022-11-08 DIAGNOSIS — C770 Secondary and unspecified malignant neoplasm of lymph nodes of head, face and neck: Secondary | ICD-10-CM | POA: Diagnosis not present

## 2022-11-08 DIAGNOSIS — R1310 Dysphagia, unspecified: Secondary | ICD-10-CM | POA: Diagnosis not present

## 2022-11-08 MED ORDER — LOSARTAN POTASSIUM 50 MG PO TABS: 50.0000 mg | ORAL_TABLET | Freq: Every day | ORAL | 0 refills | Status: DC

## 2022-11-08 NOTE — Therapy (Signed)
OUTPATIENT SPEECH LANGUAGE PATHOLOGY ONCOLOGY EVALUATION   Patient Name: Nichole Dominguez MRN: [id number] DOB:06-08-1955, 67 y.o., female Today's Date: 11/08/2022  PCP: [name], MD REFERRING PROVIDER: Lonie Peak, MD  END OF SESSION:  End of Session - 11/08/22 1350     Visit Number 1    Number of Visits 7    Date for SLP Re-Evaluation 02/06/23    SLP Start Time 1019    SLP Stop Time  1115    SLP Time Calculation (min) 56 min    Activity Tolerance Patient tolerated treatment well             Past Medical History:  Diagnosis Date   Allergy    GERD (gastroesophageal reflux disease)    Osteopenia 01/04/2020   Plantar fasciitis    Seasonal allergies    Past Surgical History:  Procedure Laterality Date   COLONOSCOPY  2011   IR GASTROSTOMY TUBE MOD SED  08/21/2022   IR IMAGING GUIDED PORT INSERTION  08/21/2022   IR PATIENT EVAL TECH 0-60 MINS  08/31/2022   IR PATIENT EVAL TECH 0-60 MINS  09/07/2022   MOUTH SURGERY  01/2020   implant inserted   TUBAL LIGATION  1992   Patient Active Problem List   Diagnosis Date Noted   Pancytopenia, acquired (HCC) 10/12/2022   Cancer associated pain 10/12/2022   Other constipation 10/12/2022   Severe protein-calorie malnutrition (HCC) 10/12/2022   Hypomagnesemia 10/12/2022   Port-A-Cath in place 08/24/2022   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   Lower extremity edema 10/03/2021   Hypertension 08/23/2021   Neck mass 08/23/2021   Eczema 12/01/2020   Osteopenia 01/04/2020   Pap smear for cervical cancer screening 10/06/2014   Thyroid nodule 10/06/2014   Routine general medical examination at a health care facility 08/21/2013    ONSET DATE: See "pertinent hx" below    REFERRING DIAG: Secondary malignant neoplasm of cervical lymph node    THERAPY DIAG:  Dysphagia, unspecified type   Rationale for Evaluation and Treatment: Rehabilitation    SUBJECTIVE:    SUBJECTIVE STATEMENT: "My throat when swallowing is fine, it's just so gross that it won't go through." Pt accompanied by: family member   PERTINENT HISTORY:  05/07/22 She saw Dr. Marene Lenz and laryngoscopy was performed which revealed a relatively indurated mass lesion extending from the mid parotid gland to level II in the neck measuring approx. 4 cm. Biopsy of the parotid mass was non diagnostic. 06/26/22 CT neck completed after she began to develop jaw pain and right neck pain. 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 The mass appeared to encase a portion of the right external carotid artery and partially encases the right internal carotid artery Prominent right-sided cervical and upper mediastinal lymph nodes were also appreciated, suspicious for metastatic spread of  disease. 07/17/22 She saw Dr. Christoper Allegra who did a Laryngoscopy performed during this visit was notable for mild right pharyngeal fullness, mild right vocal fold hypomobility, and limited anterior movement of the right tongue. Based on CT findings, Dr. Christoper Allegra recommended proceeding with repeat IR guided biopsy of the mass, as well as staging work-up including an MRI of the neck and PET scan. 07/27/22 MRI of the face showed the infiltrative mass lesion centered in  the right carotid, parapharyngeal and parotid spaces with extensive surrounding local invasion, and findings concerning for metastatic adenopathy in the right neck and additional nonspecific mildly enlarged and rounded lymph nodes involving the upper mediastinum and contralateral left jugular chain. MRI also showed evidence of tumor extension within close proximity to multiple skill base foramina, along with evidence of right hypoglossal nerve involvement contributing to right hemitongue denervation  changes, and tumor thrombus within the encased right internal jugular vein. MRI otherwise showed no evidence of intracranial perineural tumor spread. 07/27/22 FNA of the right neck mass collected on 07/27/22 showed carcinoma with squamous features, favoring squamous cell carcinoma (however, other tumors with squamous differentiation cannot entirely be excluded); P16 diffusely positive; HPV focally positive. 08/02/22 PET demonstrated  the dominant right level IIa metastatic nodal mass measuring 4.9 x 2.5 cm with an SUV max of 15; multiple ipsilateral nodal metastases involving the right level 3, level 4, and level 5 lymph node stations including the dominant right level 3 nodal metastasis (measuring 1.1 cm) coming in contact with the right oropharynx without discrete primary tumor; FDG avidity within the left palatine tonsil (normal in size and with a max SUV of 4.4); and mildly FDG avid mediastinal, bihilar, and subcarinal lymph nodes. No other sites concerning for metastatic disease were appreciated elsewhere in the body. Given that there is encasement of the carotid, surgical resection was not advised by Dr. Christoper Allegra and  chemo/radiation was recommended.08/10/22 Consult with Dr. Basilio Cairo & 08/17/22 Consult with Dr. Al Pimple. She will receive chemo/radiation. Treatment plan:  She will receive 35 fractions of radiation to her Oropharynx and bilateral neck with weekly cisplatin. She started radiation on 08/20/22 and will start chemo on 08/24/22 and will complete 10/08/22. Pretreatment procedures. 08/21/22 PEG/PAC   PAIN:  Are you having pain? No    FALLS: Has patient fallen in last 6 months?  No   LIVING ENVIRONMENT: Lives with: lives with their spouse Lives in: House/apartment   PLOF:  Level of assistance: Independent with ADLs, Independent with IADLs Employment: Retired   PATIENT GOALS: Maintain WNL swallowing   OBJECTIVE:    DIAGNOSTIC FINDINGS: See "pertinent history" above   COGNITION: Overall cognitive  status: Within functional limits for tasks assessed   LANGUAGE: Receptive and Expressive language appeared WNL.   ORAL MOTOR EXAMINATION: Overall status: WFL   MOTOR SPEECH: Overall motor speech: Appears intact Respiration: thoracic breathing Phonation: normal Resonance: WFL Articulation: Appears intact Intelligibility: Intelligible Motor planning: Appears intact   CLINICAL SWALLOW ASSESSMENT:   Current diet: mostly dys III and thin liquids; pt denies overt sx oral or pharyngeal dysphagia Objective recommended compensations: none noted Dentition: adequate natural dentition Patient directly observed with POs: Yes: dysphagia 3 (soft) and thin liquids  Feeding: able to feed self Liquids provided by: cup and straw Oral phase signs and symptoms:  none noted Pharyngeal phase signs and symptoms:  none noted today     TODAY'S TREATMENT:  DATE:  Research states the risk for dysphagia increases due to radiation and/or chemotherapy treatment due to a variety of factors, so SLP re-educated pt regarding possible changes to swallowing musculature after rad tx, and why adherence to dysphagia HEP provided today and PO consumption was necessary to inhibit muscle fibrosis following rad tx and to mitigate muscle disuse atrophy. SLP informed pt why this would be detrimental to their swallowing status and to their pulmonary health. Pt demonstrated understanding of these things to SLP. SLP encouraged pt to safely eat and drink as deep into their radiation/chemotherapy as possible to provide the best possible long-term swallowing outcome for pt.    SLP then reviewed the individualized HEP for pt ithat was developed in June involving oral and pharyngeal strengthening and ROM and pt was instructed how to perform these exercises, including SLP demonstration. After SLP  demonstration, pt return demonstrated each exercise. SLP ensured pt performance was correct prior to educating pt on next exercise. Pt required usual min-mod cues faded to modified independent to perform HEP. Pt was instructed to complete this program 6-7 days/week, at least 20 reps/day with performing no less than 5 reps at one time. Shaker x3 reps BID, until 6 months after the last day of rad tx, and then x2 a week after that, indefinitely.     PATIENT EDUCATION: Education details: late effects head/neck radiation on swallow function, HEP procedure, food journal Person educated: Patient  Education method: Explanation, Demonstration, Verbal cues, and Handouts Education comprehension: verbalized understanding, returned demonstration, verbal cues required, and needs further education     ASSESSMENT:   CLINICAL IMPRESSION: Patient is a 67 y.o. F who was seen today for assessment of swallowing after completing radiation/chemoradiation therapy. POs: Pt ate a bite of fig bar and drank thin liquids without overt s/s oral or pharyngeal difficulty. At this time pt swallowing is deemed WNL/WFL with these POs. No oral or overt s/sx pharyngeal deficits, including aspiration were observed. There are no overt s/s aspiration PNA observed by SLP nor any reported by pt at this time. Data indicate that pt's swallow ability could very well decline over time following the conclusion of chemorad therapy due to muscle disuse atrophy and/or muscle fibrosis. Pt will cont to need to be seen by SLP in order to assess safety of PO intake, assess the need for recommending any objective swallow assessment, and ensuring pt is correctly completing the individualized HEP.   OBJECTIVE IMPAIRMENTS: include dysphagia. These impairments are limiting patient from safety when swallowing. Factors affecting potential to achieve goals and functional outcome are  none noted today. Patient will benefit from skilled SLP services to address  above impairments and improve overall function.   REHAB POTENTIAL: Good   GOALS: Goals reviewed with patient? No   SHORT TERM GOALS: Target: 3rd total session     1. Pt will complete HEP with modified independence in 2 sessions Baseline: Goal status: Initial     2.  pt will tell SLP why pt is completing HEP with modified independence Baseline:  Goal status: Initial     3.  pt ill describe 3 overt s/s aspiration PNA with modified independence Baseline:  Goal status: Initial     4.  pt will tell SLP how a food journal may help return to a more normalized             diet Baseline:  Goal status: Initial     LONG TERM GOALS: Target: 7th total session   1.  pt will complete HEP with independence over two visits Baseline:  Goal status: Initial   2.  pt will describe how to modify HEP over time, and the timeline associated with reduction in HEP frequency with modified independence over two sessions Baseline:  Goal status: Initial   PLAN:   SLP FREQUENCY:  once approx every 4 weeks   SLP DURATION:  6 sessions (7 total sessions)   PLANNED INTERVENTIONS: Aspiration precaution training, Pharyngeal strengthening exercises, Diet toleration management , Environmental controls, Trials of upgraded texture/liquids, Cueing hierachy, Internal/external aids, SLP instruction and feedback, Compensatory strategies, and Patient/family education   [facility], CCC-SLP 11/08/2022, 1:50 PM

## 2022-11-20 ENCOUNTER — Encounter: Payer: 59 | Admitting: Nutrition

## 2022-11-20 ENCOUNTER — Telehealth: Payer: Self-pay | Admitting: Nutrition

## 2022-11-20 DIAGNOSIS — H5203 Hypermetropia, bilateral: Secondary | ICD-10-CM | POA: Diagnosis not present

## 2022-11-20 NOTE — Telephone Encounter (Signed)
Patient contacted RD to help communicate the need for feeding pump pickup by [facility]). Sent message to Jeri Modena for assistance.

## 2022-11-22 ENCOUNTER — Encounter (INDEPENDENT_AMBULATORY_CARE_PROVIDER_SITE_OTHER): Payer: Self-pay

## 2022-12-12 ENCOUNTER — Ambulatory Visit: Payer: Medicare Other

## 2022-12-17 DIAGNOSIS — D485 Neoplasm of uncertain behavior of skin: Secondary | ICD-10-CM | POA: Diagnosis not present

## 2022-12-17 DIAGNOSIS — C4359 Malignant melanoma of other part of trunk: Secondary | ICD-10-CM | POA: Diagnosis not present

## 2022-12-17 DIAGNOSIS — L820 Inflamed seborrheic keratosis: Secondary | ICD-10-CM | POA: Diagnosis not present

## 2022-12-17 DIAGNOSIS — D0359 Melanoma in situ of other part of trunk: Secondary | ICD-10-CM | POA: Diagnosis not present

## 2022-12-18 ENCOUNTER — Ambulatory Visit: Payer: Medicare Other | Attending: Radiation Oncology

## 2022-12-18 ENCOUNTER — Other Ambulatory Visit: Payer: Self-pay

## 2022-12-18 DIAGNOSIS — R1310 Dysphagia, unspecified: Secondary | ICD-10-CM | POA: Diagnosis not present

## 2022-12-18 DIAGNOSIS — C770 Secondary and unspecified malignant neoplasm of lymph nodes of head, face and neck: Secondary | ICD-10-CM

## 2022-12-18 NOTE — Therapy (Signed)
OUTPATIENT SPEECH LANGUAGE PATHOLOGY ONCOLOGY TREATMENT   Patient Name: Nichole Dominguez MRN: [id number] DOB:1955-08-18, 67 y.o., female Today's Date: 12/18/2022  PCP: [name], MD REFERRING PROVIDER: Lonie Peak, MD  END OF SESSION:  End of Session - 12/18/22 1325     Visit Number 2    Number of Visits 7    Date for SLP Re-Evaluation 02/06/23    SLP Start Time 1318    SLP Stop Time  1357    SLP Time Calculation (min) 39 min    Activity Tolerance Patient tolerated treatment well              Past Medical History:  Diagnosis Date   Allergy    GERD (gastroesophageal reflux disease)    Osteopenia 01/04/2020   Plantar fasciitis    Seasonal allergies    Past Surgical History:  Procedure Laterality Date   COLONOSCOPY  2011   IR GASTROSTOMY TUBE MOD SED  08/21/2022   IR IMAGING GUIDED PORT INSERTION  08/21/2022   IR PATIENT EVAL TECH 0-60 MINS  08/31/2022   IR PATIENT EVAL TECH 0-60 MINS  09/07/2022   MOUTH SURGERY  01/2020   implant inserted   TUBAL LIGATION  1992   Patient Active Problem List   Diagnosis Date Noted   Pancytopenia, acquired (HCC) 10/12/2022   Cancer associated pain 10/12/2022   Other constipation 10/12/2022   Severe protein-calorie malnutrition (HCC) 10/12/2022   Hypomagnesemia 10/12/2022   Port-A-Cath in place 08/24/2022   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   Lower extremity edema 10/03/2021   Hypertension 08/23/2021   Neck mass 08/23/2021   Eczema 12/01/2020   Osteopenia 01/04/2020   Pap smear for cervical cancer screening 10/06/2014   Thyroid nodule 10/06/2014   Routine general medical examination at a health care facility 08/21/2013    ONSET DATE: See "pertinent hx" below    REFERRING DIAG: Secondary malignant neoplasm of cervical lymph node    THERAPY DIAG:  Dysphagia, unspecified type   Rationale for Evaluation and Treatment: Rehabilitation    SUBJECTIVE:    SUBJECTIVE STATEMENT: "The bread - I don't know what it is. It just balls up. And everything still tastes (bad)." Pt accompanied by: family member   PERTINENT HISTORY:  05/07/22 She saw Dr. Marene Lenz and laryngoscopy was performed which revealed a relatively indurated mass lesion extending from the mid parotid gland to level II in the neck measuring approx. 4 cm. Biopsy of the parotid mass was non diagnostic. 06/26/22 CT neck completed after she began to develop jaw pain and right neck pain. 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 The mass appeared to encase a portion of the right external carotid artery and partially encases the right internal carotid artery Prominent right-sided cervical and upper mediastinal lymph nodes were also appreciated, suspicious for metastatic spread of  disease. 07/17/22 She saw Dr. Christoper Allegra who did a Laryngoscopy performed during this visit was notable for mild right pharyngeal fullness, mild right vocal fold hypomobility, and limited anterior movement of the right tongue. Based on CT findings, Dr. Christoper Allegra recommended proceeding with repeat IR guided biopsy of the mass, as well as staging work-up including an MRI of the neck and PET scan. 07/27/22 MRI of the face showed the infiltrative  mass lesion centered in the right carotid, parapharyngeal and parotid spaces with extensive surrounding local invasion, and findings concerning for metastatic adenopathy in the right neck and additional nonspecific mildly enlarged and rounded lymph nodes involving the upper mediastinum and contralateral left jugular chain. MRI also showed evidence of tumor extension within close proximity to multiple skill base foramina, along with evidence of right hypoglossal nerve involvement contributing to right hemitongue  denervation changes, and tumor thrombus within the encased right internal jugular vein. MRI otherwise showed no evidence of intracranial perineural tumor spread. 07/27/22 FNA of the right neck mass collected on 07/27/22 showed carcinoma with squamous features, favoring squamous cell carcinoma (however, other tumors with squamous differentiation cannot entirely be excluded); P16 diffusely positive; HPV focally positive. 08/02/22 PET demonstrated  the dominant right level IIa metastatic nodal mass measuring 4.9 x 2.5 cm with an SUV max of 15; multiple ipsilateral nodal metastases involving the right level 3, level 4, and level 5 lymph node stations including the dominant right level 3 nodal metastasis (measuring 1.1 cm) coming in contact with the right oropharynx without discrete primary tumor; FDG avidity within the left palatine tonsil (normal in size and with a max SUV of 4.4); and mildly FDG avid mediastinal, bihilar, and subcarinal lymph nodes. No other sites concerning for metastatic disease were appreciated elsewhere in the body. Given that there is encasement of the carotid, surgical resection was not advised by Dr. Christoper Allegra and  chemo/radiation was recommended.08/10/22 Consult with Dr. Basilio Cairo & 08/17/22 Consult with Dr. Al Pimple. She will receive chemo/radiation. Treatment plan:  She will receive 35 fractions of radiation to her Oropharynx and bilateral neck with weekly cisplatin. She started radiation on 08/20/22 and will start chemo on 08/24/22 and will complete 10/08/22. Pretreatment procedures. 08/21/22 PEG/PAC   PAIN:  Are you having pain? No    FALLS: Has patient fallen in last 6 months?  No   LIVING ENVIRONMENT: Lives with: lives with their spouse Lives in: House/apartment   PLOF:  Level of assistance: Independent with ADLs, Independent with IADLs Employment: Retired   PATIENT GOALS: Maintain WNL swallowing   OBJECTIVE:        TODAY'S TREATMENT:                                                                                                                                          DATE:  12/18/22: SLP identified what looks to be lymphedema and told referring MD. She will send referral for PT.  Pt is having 2 eggs daily, and has one more "meal" daily - baked beans and other softer foods. Still has dysgeusia/ageusia. SLP told pt to focus on those foods she can tolerate. She still has PEG but is losing weight. SLP encouraged her to reach out to one of the oncology dieticians about this.SLP provided pt a packet of high calorie high protein liquids/shake recipes.  SLP had  to provide rare min A for HEP today. Pt is not completing scope as directed (doing effortful and masako but not shaker, Mendlsohn, and supraglottic). SLP reiterated to pt to complete ENTIRE HEP.  No overt s/sx pharyngeal deficits; Pt had extended mastication time very likely due to xerostomia. SLP provided handout and explanation of aspiration PNA. Pt demonstrated understanding. Decr to once every other month if pt's HEP procedure is WNL and she exhibits WNL swallowing safety.  Research states the risk for dysphagia increases due to radiation and/or chemotherapy treatment due to a variety of factors, so SLP re-educated pt regarding possible changes to swallowing musculature after rad tx, and why adherence to dysphagia HEP provided today and PO consumption was necessary to inhibit muscle fibrosis following rad tx and to mitigate muscle disuse atrophy. SLP informed pt why this would be detrimental to their swallowing status and to their pulmonary health. Pt demonstrated understanding of these things to SLP. SLP encouraged pt to safely eat and drink as deep into their radiation/chemotherapy as possible to provide the best possible long-term swallowing outcome for pt.   SLP then reviewed the individualized HEP for pt ithat was developed in June involving oral and pharyngeal strengthening and ROM and pt was instructed how to perform these  exercises, including SLP demonstration. After SLP demonstration, pt return demonstrated each exercise. SLP ensured pt performance was correct prior to educating pt on next exercise. Pt required usual min-mod cues faded to modified independent to perform HEP. Pt was instructed to complete this program 6-7 days/week, at least 20 reps/day with performing no less than 5 reps at one time. Shaker x3 reps BID, until 6 months after the last day of rad tx, and then x2 a week after that, indefinitely.     PATIENT EDUCATION: Education details: see "today's treatment" for details Person educated: Patient  Education method: Explanation, Demonstration, Verbal cues, and Handouts Education comprehension: verbalized understanding, returned demonstration, verbal cues required, and needs further education     ASSESSMENT:   CLINICAL IMPRESSION: Patient is a 67 y.o. F who was seen today for treatment of swallowing after completing radiation/chemoradiation therapy. POs: At this time pt swallowing is deemed WNL/WFL with dys III and most regular diet items, if using liquid wash. No oral or overt s/sx pharyngeal deficits, including aspiration were observed. There are no overt s/s aspiration PNA observed by SLP nor any reported by pt at this time. Pt did exhibit extended mastication time today which is likely due to xerostomia. Data indicate that pt's swallow ability could very well decline over time following the conclusion of chemorad therapy due to muscle disuse atrophy and/or muscle fibrosis. Pt will cont to need to be seen by SLP in order to assess safety of PO intake, assess the need for recommending any objective swallow assessment, and ensuring pt is correctly completing the individualized HEP.   OBJECTIVE IMPAIRMENTS: include dysphagia. These impairments are limiting patient from safety when swallowing. Factors affecting potential to achieve goals and functional outcome are  none noted today. Patient will benefit  from skilled SLP services to address above impairments and improve overall function.   REHAB POTENTIAL: Good   GOALS: Goals reviewed with patient? No   SHORT TERM GOALS: Target: 3rd total session     1. Pt will complete HEP with modified independence in 2 sessions Baseline: Goal status: Initial     2.  pt will tell SLP why pt is completing HEP with modified independence Baseline:  Goal status: Initial  3.  pt ill describe 3 overt s/s aspiration PNA with modified independence Baseline:  Goal status: met     4.  pt will tell SLP how a food journal may help return to a more normalized             diet Baseline:  Goal status: Initial     LONG TERM GOALS: Target: 7th total session   1.  pt will complete HEP with independence over two visits Baseline:  Goal status: Initial   2.  pt will describe how to modify HEP over time, and the timeline associated with reduction in HEP frequency with modified independence over two sessions Baseline:  Goal status: Initial   PLAN:   SLP FREQUENCY:  once approx every 4 weeks   SLP DURATION:  6 sessions (7 total sessions)   PLANNED INTERVENTIONS: Aspiration precaution training, Pharyngeal strengthening exercises, Diet toleration management , Environmental controls, Trials of upgraded texture/liquids, Cueing hierachy, Internal/external aids, SLP instruction and feedback, Compensatory strategies, and Patient/family education   [facility], CCC-SLP 12/18/2022, 3:11 PM

## 2022-12-18 NOTE — Patient Instructions (Signed)
   Signs of Aspiration Pneumonia   Chest pain/tightness Fever (can be low grade) Cough  With foul-smelling phlegm (sputum) With sputum containing pus or blood With greenish sputum Fatigue  Shortness of breath  Wheezing   **IF YOU HAVE THESE SIGNS, CONTACT YOUR DOCTOR OR GO TO THE EMERGENCY DEPARTMENT OR URGENT CARE AS SOON AS POSSIBLE**     

## 2022-12-19 ENCOUNTER — Other Ambulatory Visit (HOSPITAL_BASED_OUTPATIENT_CLINIC_OR_DEPARTMENT_OTHER): Payer: Self-pay

## 2022-12-19 ENCOUNTER — Encounter: Payer: Self-pay | Admitting: Hematology and Oncology

## 2022-12-19 ENCOUNTER — Other Ambulatory Visit (HOSPITAL_COMMUNITY)
Admission: RE | Admit: 2022-12-19 | Discharge: 2022-12-19 | Disposition: A | Discharge status: Home / Self Care | Payer: Medicare Other | Source: Ambulatory Visit | Attending: Family | Admitting: Family

## 2022-12-19 ENCOUNTER — Encounter: Payer: Self-pay | Admitting: Family

## 2022-12-19 ENCOUNTER — Ambulatory Visit (INDEPENDENT_AMBULATORY_CARE_PROVIDER_SITE_OTHER): Payer: Medicare Other | Admitting: Family

## 2022-12-19 VITALS — BP 136/71 | HR 90 | Temp 97.8°F | Resp 16 | Ht 61.0 in | Wt 133.0 lb

## 2022-12-19 DIAGNOSIS — L309 Dermatitis, unspecified: Secondary | ICD-10-CM

## 2022-12-19 DIAGNOSIS — I10 Essential (primary) hypertension: Secondary | ICD-10-CM | POA: Diagnosis not present

## 2022-12-19 DIAGNOSIS — Z1151 Encounter for screening for human papillomavirus (HPV): Secondary | ICD-10-CM | POA: Diagnosis not present

## 2022-12-19 DIAGNOSIS — E46 Unspecified protein-calorie malnutrition: Secondary | ICD-10-CM

## 2022-12-19 DIAGNOSIS — Z1231 Encounter for screening mammogram for malignant neoplasm of breast: Secondary | ICD-10-CM | POA: Diagnosis not present

## 2022-12-19 DIAGNOSIS — Z23 Encounter for immunization: Secondary | ICD-10-CM | POA: Diagnosis not present

## 2022-12-19 DIAGNOSIS — E348 Other specified endocrine disorders: Secondary | ICD-10-CM

## 2022-12-19 DIAGNOSIS — Z01419 Encounter for gynecological examination (general) (routine) without abnormal findings: Secondary | ICD-10-CM

## 2022-12-19 DIAGNOSIS — Z0000 Encounter for general adult medical examination without abnormal findings: Secondary | ICD-10-CM | POA: Diagnosis not present

## 2022-12-19 DIAGNOSIS — E43 Unspecified severe protein-calorie malnutrition: Secondary | ICD-10-CM

## 2022-12-19 DIAGNOSIS — C770 Secondary and unspecified malignant neoplasm of lymph nodes of head, face and neck: Secondary | ICD-10-CM

## 2022-12-19 DIAGNOSIS — E041 Nontoxic single thyroid nodule: Secondary | ICD-10-CM | POA: Diagnosis not present

## 2022-12-19 LAB — CBC WITH DIFFERENTIAL/PLATELET
Basophils Absolute: 0 10*3/uL (ref 0.0–0.1)
Basophils Relative: 0.7 % (ref 0.0–3.0)
Eosinophils Absolute: 0.5 10*3/uL (ref 0.0–0.7)
Eosinophils Relative: 8.1 % — ABNORMAL HIGH (ref 0.0–5.0)
HCT: 37.1 % (ref 36.0–46.0)
Hemoglobin: 12.4 g/dL (ref 12.0–15.0)
Lymphocytes Relative: 13.2 % (ref 12.0–46.0)
Lymphs Abs: 0.8 10*3/uL (ref 0.7–4.0)
MCHC: 33.4 g/dL (ref 30.0–36.0)
MCV: 91.2 fl (ref 78.0–100.0)
Monocytes Absolute: 0.5 10*3/uL (ref 0.1–1.0)
Monocytes Relative: 7.4 % (ref 3.0–12.0)
Neutro Abs: 4.5 10*3/uL (ref 1.4–7.7)
Neutrophils Relative %: 70.6 % (ref 43.0–77.0)
Platelets: 346 10*3/uL (ref 150.0–400.0)
RBC: 4.07 Mil/uL (ref 3.87–5.11)
RDW: 13.2 % (ref 11.5–15.5)
WBC: 6.3 10*3/uL (ref 4.0–10.5)

## 2022-12-19 LAB — COMPREHENSIVE METABOLIC PANEL
ALT: 9 U/L (ref 0–35)
AST: 8 U/L (ref 0–37)
Albumin: 3.8 g/dL (ref 3.5–5.2)
Alkaline Phosphatase: 75 U/L (ref 39–117)
BUN: 9 mg/dL (ref 6–23)
CO2: 23 meq/L (ref 19–32)
Calcium: 9.4 mg/dL (ref 8.4–10.5)
Chloride: 98 meq/L (ref 96–112)
Creatinine, Ser: 0.82 mg/dL (ref 0.40–1.20)
GFR: 75.23 mL/min (ref >60.00)
Glucose, Bld: 81 mg/dL (ref 70–99)
Potassium: 5 meq/L (ref 3.5–5.1)
Sodium: 134 meq/L — ABNORMAL LOW (ref 135–145)
Total Bilirubin: 0.5 mg/dL (ref 0.2–1.2)
Total Protein: 6.7 g/dL (ref 6.0–8.3)

## 2022-12-19 LAB — TSH: TSH: 3.02 u[IU]/mL (ref 0.35–5.50)

## 2022-12-19 MED ORDER — COMIRNATY 30 MCG/0.3ML IM SUSY
0.3000 mL | PREFILLED_SYRINGE | Freq: Once | INTRAMUSCULAR | 0 refills | Status: AC
  Filled 2022-12-19: qty 0.3, 1d supply, fill #0

## 2022-12-19 NOTE — Assessment & Plan Note (Signed)
S/p radiation/chemo, will repeat PET in October.  Mass is certainly much smaller than it was prior to treatment.

## 2022-12-19 NOTE — Assessment & Plan Note (Signed)
Check follow up mag per pt request.

## 2022-12-19 NOTE — Assessment & Plan Note (Addendum)
Stable at this time without medication. No longer requiring Losartan since she has lost about 30 pounds.

## 2022-12-19 NOTE — Progress Notes (Signed)
Subjective:     Patient ID: Nichole Dominguez, female    DOB: 05-Sep-1955, 67 y.o.   MRN: [id number]  Chief Complaint  Patient presents with   Annual Exam         HPI  Discussed the use of AI scribe software for clinical note transcription with the patient, who gave verbal consent to proceed.  67 year old female presents today for her yearly physical and pap smear exam. She states that she is doing well today and having no issues. Went over recent lab work and she asked for a new magnesium level to be drawn due to when she gets chemo treatments she was getting multiple rounds of magnesium given to her and she just wants to check every out.  She states that:  Mammogram: in chart 12/31/2019  Pap smear is getting done today   Dentist: may 2024  Eye Exam: August 2024  Flu: wants it today   Covid: needs to get booster  PNA: got it done here       Health Maintenance Due  Topic Date Due   Medicare Annual Wellness (AWV)  Never done   Pneumonia Vaccine 1+ Years old (1 of 2 - PCV) Never done   HIV Screening  Never done   Hepatitis C Screening  Never done   MAMMOGRAM  01/03/2021   PAP SMEAR-Modifier  12/23/2021   DEXA SCAN  01/03/2022   INFLUENZA VACCINE  11/08/2022   COVID-19 Vaccine (5 - 2023-24 season) 12/09/2022    Past Medical History:  Diagnosis Date   Allergy    GERD (gastroesophageal reflux disease)    Osteopenia 01/04/2020   Plantar fasciitis    Seasonal allergies     Past Surgical History:  Procedure Laterality Date   COLONOSCOPY  2011   IR GASTROSTOMY TUBE MOD SED  08/21/2022   IR IMAGING GUIDED PORT INSERTION  08/21/2022   IR PATIENT EVAL TECH 0-60 MINS  08/31/2022   IR PATIENT EVAL TECH 0-60 MINS  09/07/2022   MOUTH SURGERY  01/2020   implant inserted   TUBAL LIGATION  1992    Family History  Problem Relation Age of Onset   Diabetes Mother    Heart disease Mother        chf   Hypertension Mother    Cervical cancer Mother    Obesity Mother     Colon polyps Mother    Thyroid cancer Father    Cancer Father        thyroid x2   Heart attack Father    Breast cancer Sister    Lupus Sister    Obesity Maternal Grandmother    Diabetes Maternal Grandmother    Heart disease Maternal Grandmother    Hypertension Maternal Grandmother    Stroke Maternal Grandmother    Diabetes Mellitus II Paternal Grandmother        died from diabetic coma   Cancer Paternal Grandfather        not sure what type   Heart attack Son    Esophageal cancer Neg Hx    Stomach cancer Neg Hx    Rectal cancer Neg Hx    Colon cancer Neg Hx     Social History   Socioeconomic History   Marital status: Married    Spouse name: Not on file   Number of children: Not on file   Years of education: Not on file   Highest education level: Not on file  Occupational History   Not  on file  Tobacco Use   Smoking status: Former    Current packs/day: 0.00    Average packs/day: 1 pack/day for 24.0 years (24.0 ttl pk-yrs)    Types: Cigarettes    Start date: 10/10/1972    Quit date: 10/10/1996    Years since quitting: 26.2   Smokeless tobacco: Never  Vaping Use   Vaping status: Never Used  Substance and Sexual Activity   Alcohol use: Yes    Alcohol/week: 0.0 standard drinks of alcohol    Comment: 1 shot vodka daily   Drug use: No   Sexual activity: Yes    Partners: Male  Other Topics Concern   Not on file  Social History Narrative   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    Lives with husband   1 daughter in [place], 15 grandchildren   Son died   2 grown stepchildren- both Designer, multimedia, grandchildren   Enjoys Corporate investment banker   Social Determinants of Corporate investment banker Strain: Not on file  Food Insecurity: No Food Insecurity (08/09/2022)   Hunger Vital Sign    Worried About Running Out of Food in the Last Year: Never true    Ran Out of Food in the Last Year: Never true  Transportation Needs: No Transportation Needs (08/09/2022)   PRAPARE -  Administrator, Civil Service (Medical): No    Lack of Transportation (Non-Medical): No  Physical Activity: Not on file  Stress: Not on file  Social Connections: Not on file  Intimate Partner Violence: Not At Risk (08/09/2022)   Humiliation, Afraid, Rape, and Kick questionnaire    Fear of Current or Ex-Partner: No    Emotionally Abused: No    Physically Abused: No    Sexually Abused: No    Outpatient Medications Prior to Visit  Medication Sig Dispense Refill   acetaminophen (TYLENOL) 500 MG tablet Take 1,500 mg by mouth every 6 (six) hours as needed.     augmented betamethasone dipropionate (DIPROLENE-AF) 0.05 % ointment Apply topically 2 (two) times daily. 15 g 0   Calcium Carbonate (CALCIUM 600 HIGH POTENCY PO) Take 3 capsules by mouth daily.     cholecalciferol (VITAMIN D3) 25 MCG (1000 UNIT) tablet Take 1,000 Units by mouth daily.     fluticasone (FLONASE) 50 MCG/ACT nasal spray USE 2 SPRAYS IN EACH NOSTRIL DAILY 48 g 3   losartan (COZAAR) 50 MG tablet Take 1 tablet (50 mg total) by mouth daily. 90 tablet 0   Multiple Vitamins-Minerals (MULTIVITAMIN WITH MINERALS) tablet Take 1 tablet by mouth daily.     omeprazole (PRILOSEC) 20 MG capsule TAKE 1 CAPSULE (20 MG TOTAL) BY MOUTH EVERY MORNING. 90 capsule 1   fentaNYL (DURAGESIC) 12 MCG/HR Place 2 patches onto the skin every 3 (three) days as directed. 10 patch 0   morphine (ROXANOL) 20 MG/ML concentrated solution Take 1 mL by mouth every 4 hours as needed for severe pain. 240 mL 0   Nutritional Supplements ([employer] STANDARD 1.4) LIQD Change tube feeding to [facility] 1.4 via PEG at 20 mL an hour continuously.  If tolerated for 24 hours, increase 10 mL every 8 hours to goal rate of 68 mL/h over 24 hours.  Flush feeding tube with 120 mL free water before and after starting and stopping continuous feedings.  In addition give 220 mL free water 4 times a day.  Provides 2275 cal, 100 g protein, 2275 mL free water/100% estimated  needs.  Send pump  and 2 cases of formula. 1625 mL 6   ondansetron (ZOFRAN-ODT) 8 MG disintegrating tablet Dissolve 1 tablet (8 mg total) by mouth every 8 (eight) hours as needed for nausea or vomiting. 20 tablet 0   prochlorperazine (COMPAZINE) 10 MG tablet Take 10 mg by mouth every 6 (six) hours as needed.     No facility-administered medications prior to visit.    Allergies  Allergen Reactions   Amlodipine     swelling   Darvon [Propoxyphene] Rash   Sudafed [Pseudoephedrine Hcl]     rash    Review of Systems  Respiratory:  Positive for cough and sputum production.   Gastrointestinal:  Negative for diarrhea, nausea and vomiting.  Genitourinary:  Negative for urgency.  Musculoskeletal:  Negative for back pain, joint pain and neck pain.  Neurological:  Positive for dizziness.  Psychiatric/Behavioral:  Negative for depression, substance abuse and suicidal ideas.        Objective:    Physical Exam Constitutional:      Appearance: Normal appearance. She is normal weight.  HENT:     Head: Normocephalic.     Right Ear: Tympanic membrane, ear canal and external ear normal.     Left Ear: Tympanic membrane, ear canal and external ear normal.     Nose: Nose normal.     Mouth/Throat:     Mouth: Mucous membranes are moist.  Eyes:     Pupils: Pupils are equal, round, and reactive to light.  Cardiovascular:     Rate and Rhythm: Normal rate and regular rhythm.     Pulses: Normal pulses.     Heart sounds: Normal heart sounds.  Pulmonary:     Effort: Pulmonary effort is normal.     Breath sounds: Normal breath sounds.  Abdominal:     General: Abdomen is flat. Bowel sounds are normal.     Palpations: Abdomen is soft.  Genitourinary:    General: Normal vulva.  Musculoskeletal:        General: Normal range of motion.     Cervical back: Normal range of motion.  Skin:    General: Skin is warm.     Capillary Refill: Capillary refill takes less than 2 seconds.  Neurological:      General: No focal deficit present.     Mental Status: She is alert and oriented to person, place, and time. Mental status is at baseline.  Psychiatric:        Mood and Affect: Mood normal.        Behavior: Behavior normal.        Thought Content: Thought content normal.        Judgment: Judgment normal.      BP 136/71 (BP Location: Right Arm, Patient Position: Sitting, Cuff Size: Small)   Pulse 90   Temp 97.8 F (36.6 C) (Oral)   Resp 16   Ht 5\' 1"  (1.549 m)   Wt 133 lb (60.3 kg)   SpO2 100%   BMI 25.13 kg/m  Wt Readings from Last 3 Encounters:  12/19/22 133 lb (60.3 kg)  10/31/22 141 lb 9.6 oz (64.2 kg)  10/26/22 141 lb 4 oz (64.1 kg)       Assessment & Plan:   Problem List Items Addressed This Visit   None   I have discontinued [employer] Standard 1.4, morphine, fentaNYL, prochlorperazine, and ondansetron. I am also having her maintain her multivitamin with minerals, fluticasone, Calcium Carbonate (CALCIUM 600 HIGH POTENCY PO), cholecalciferol, augmented betamethasone  dipropionate, omeprazole, acetaminophen, and losartan.  No orders of the defined types were placed in this encounter.

## 2022-12-19 NOTE — Progress Notes (Signed)
Subjective:     Patient ID: Nichole Dominguez, female    DOB: 1955/12/06, 67 y.o.   MRN: [id number]  Chief Complaint  Patient presents with   Annual Exam         HPI  Discussed the use of AI scribe software for clinical note transcription with the patient, who gave verbal consent to proceed.  History of Present Illness          Patient is a 67 yr old female who presents today for complete physical.  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  She completed radiation/chemotherapy. Her treatment was complicated by severe dysgeusia, weight loss/malnutrition which led to placement of a G-tube for feedings.  She was on continuous tube feeds but now has been transitioned to bolus feeds and PO's as tolerated. She notes that food does not taste good and her appetite is poor.  She believes that her sense of taste is poor but that it is improving slightly.  She will follow back up with Radiation Oncology for PET/CT in October.  She is followed by Dr. Vella Redhead for ENT and Dr. Rachel Moulds for Oncology.   Immunizations: due for Flu shot, Covid Booster and Prevnar 20 Diet: see above Exercise: has been reduced due to recent treatments Colonoscopy:  due 2028 Dexa: due Pap Smear: due Mammogram: due    Health Maintenance Due  Topic Date Due   Medicare Annual Wellness (AWV)  Never done   HIV Screening  Never done   Hepatitis C Screening  Never done   MAMMOGRAM  01/03/2021   PAP SMEAR-Modifier  12/23/2021   DEXA SCAN  01/03/2022    Past Medical History:  Diagnosis Date   Allergy    GERD (gastroesophageal reflux disease)    Osteopenia 01/04/2020   Plantar fasciitis    Seasonal allergies     Past Surgical History:  Procedure Laterality Date   COLONOSCOPY  2011   IR GASTROSTOMY TUBE MOD SED  08/21/2022   IR IMAGING GUIDED PORT INSERTION  08/21/2022   IR PATIENT EVAL TECH 0-60  MINS  08/31/2022   IR PATIENT EVAL TECH 0-60 MINS  09/07/2022   MOUTH SURGERY  01/2020   implant inserted   TUBAL LIGATION  1992    Family History  Problem Relation Age of Onset   Diabetes Mother    Heart disease Mother        chf   Hypertension Mother    Cervical cancer Mother    Obesity Mother    Colon polyps Mother    Thyroid cancer Father    Cancer Father        thyroid x2   Heart attack Father    Breast cancer Sister    Lupus Sister    Obesity Maternal Grandmother    Diabetes Maternal Grandmother    Heart disease Maternal Grandmother    Hypertension Maternal Grandmother    Stroke Maternal Grandmother    Diabetes Mellitus II Paternal Grandmother        died from diabetic coma   Cancer Paternal Grandfather        not sure what type   Heart attack Son    Esophageal cancer Neg Hx    Stomach cancer Neg Hx    Rectal cancer Neg Hx    Colon cancer Neg Hx     Social History   Socioeconomic History  Marital status: Married    Spouse name: Not on file   Number of children: Not on file   Years of education: Not on file   Highest education level: Not on file  Occupational History   Not on file  Tobacco Use   Smoking status: Former    Current packs/day: 0.00    Average packs/day: 1 pack/day for 24.0 years (24.0 ttl pk-yrs)    Types: Cigarettes    Start date: 10/10/1972    Quit date: 10/10/1996    Years since quitting: 26.2   Smokeless tobacco: Never  Vaping Use   Vaping status: Never Used  Substance and Sexual Activity   Alcohol use: Yes    Alcohol/week: 0.0 standard drinks of alcohol    Comment: 1 shot vodka daily   Drug use: No   Sexual activity: Yes    Partners: Male  Other Topics Concern   Not on file  Social History Narrative   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    Lives with husband   1 daughter in [place], 53 grandchildren   Son died   2 grown stepchildren- both Designer, multimedia, grandchildren   Enjoys Corporate investment banker   Social Determinants of  Corporate investment banker Strain: Not on file  Food Insecurity: No Food Insecurity (08/09/2022)   Hunger Vital Sign    Worried About Running Out of Food in the Last Year: Never true    Ran Out of Food in the Last Year: Never true  Transportation Needs: No Transportation Needs (08/09/2022)   PRAPARE - Administrator, Civil Service (Medical): No    Lack of Transportation (Non-Medical): No  Physical Activity: Not on file  Stress: Not on file  Social Connections: Not on file  Intimate Partner Violence: Not At Risk (08/09/2022)   Humiliation, Afraid, Rape, and Kick questionnaire    Fear of Current or Ex-Partner: No    Emotionally Abused: No    Physically Abused: No    Sexually Abused: No    Outpatient Medications Prior to Visit  Medication Sig Dispense Refill   acetaminophen (TYLENOL) 500 MG tablet Take 1,500 mg by mouth every 6 (six) hours as needed.     augmented betamethasone dipropionate (DIPROLENE-AF) 0.05 % ointment Apply topically 2 (two) times daily. 15 g 0   Calcium Carbonate (CALCIUM 600 HIGH POTENCY PO) Take 3 capsules by mouth daily.     cholecalciferol (VITAMIN D3) 25 MCG (1000 UNIT) tablet Take 1,000 Units by mouth daily.     fluticasone (FLONASE) 50 MCG/ACT nasal spray USE 2 SPRAYS IN EACH NOSTRIL DAILY 48 g 3   losartan (COZAAR) 50 MG tablet Take 1 tablet (50 mg total) by mouth daily. 90 tablet 0   Multiple Vitamins-Minerals (MULTIVITAMIN WITH MINERALS) tablet Take 1 tablet by mouth daily.     omeprazole (PRILOSEC) 20 MG capsule TAKE 1 CAPSULE (20 MG TOTAL) BY MOUTH EVERY MORNING. 90 capsule 1   fentaNYL (DURAGESIC) 12 MCG/HR Place 2 patches onto the skin every 3 (three) days as directed. 10 patch 0   morphine (ROXANOL) 20 MG/ML concentrated solution Take 1 mL by mouth every 4 hours as needed for severe pain. 240 mL 0   Nutritional Supplements ([place] STANDARD 1.4) LIQD Change tube feeding to [facility] 1.4 via PEG at 20 mL an hour continuously.  If tolerated  for 24 hours, increase 10 mL every 8 hours to goal rate of 68 mL/h over 24 hours.  Flush feeding tube  with 120 mL free water before and after starting and stopping continuous feedings.  In addition give 220 mL free water 4 times a day.  Provides 2275 cal, 100 g protein, 2275 mL free water/100% estimated needs.  Send pump and 2 cases of formula. 1625 mL 6   ondansetron (ZOFRAN-ODT) 8 MG disintegrating tablet Dissolve 1 tablet (8 mg total) by mouth every 8 (eight) hours as needed for nausea or vomiting. 20 tablet 0   prochlorperazine (COMPAZINE) 10 MG tablet Take 10 mg by mouth every 6 (six) hours as needed.     No facility-administered medications prior to visit.    Allergies  Allergen Reactions   Amlodipine     swelling   Darvon [Propoxyphene] Rash   Sudafed [Pseudoephedrine Hcl]     rash    ROS     Objective:    Physical Exam Exam conducted with a chaperone present.  Constitutional:      General: She is not in acute distress.    Appearance: Normal appearance. She is well-developed.  HENT:     Head: Normocephalic and atraumatic.     Right Ear: External ear normal.     Left Ear: External ear normal.  Eyes:     General: No scleral icterus. Neck:     Thyroid: No thyromegaly.     Comments: Previously palpated right upper anterior neck mass is barely palpable today Cardiovascular:     Rate and Rhythm: Normal rate and regular rhythm.     Heart sounds: Normal heart sounds. No murmur heard. Pulmonary:     Effort: Pulmonary effort is normal. No respiratory distress.     Breath sounds: Normal breath sounds. No wheezing.  Genitourinary:    General: Normal vulva.     Exam position: Lithotomy position.     Labia:        Right: No rash.        Left: No rash.      Urethra: No prolapse or urethral pain.     Vagina: Normal.     Cervix: Normal.     Uterus: Normal.      Adnexa: Right adnexa normal and left adnexa normal.  Musculoskeletal:     Cervical back: Neck supple.  Skin:     General: Skin is warm and dry.  Neurological:     Mental Status: She is alert and oriented to person, place, and time.  Psychiatric:        Mood and Affect: Mood normal.        Behavior: Behavior normal.        Thought Content: Thought content normal.        Judgment: Judgment normal.     BP 136/71 (BP Location: Right Arm, Patient Position: Sitting, Cuff Size: Small)   Pulse 90   Temp 97.8 F (36.6 C) (Oral)   Resp 16   Ht 5\' 1"  (1.549 m)   Wt 133 lb (60.3 kg)   SpO2 100%   BMI 25.13 kg/m  Wt Readings from Last 3 Encounters:  12/19/22 133 lb (60.3 kg)  10/31/22 141 lb 9.6 oz (64.2 kg)  10/26/22 141 lb 4 oz (64.1 kg)       Assessment & Plan:   Problem List Items Addressed This Visit       Unprioritized   Thyroid nodule    Check follow up TSH especially in setting of recent neck radiation.      Severe protein-calorie malnutrition (HCC)    This is being  monitored by oncology.        Secondary malignant neoplasm of cervical lymph node [facility])    S/p radiation/chemo, will repeat PET in October.  Mass is certainly much smaller than it was prior to treatment.      Routine general medical examination at a health care facility - Primary    Flu shot and Prevnar 20 today in clinic.  Continue advancing diet as tolerated.  Will obtain a dexa/mammo. Pap performed today. Colo up to date. She plans to get the latest covid booster at the pharmacy.       Hypomagnesemia    Check follow up mag per pt request.       Hypertension    Stable at this time without medication. No longer requiring Losartan since she has lost about 30 pounds.      Eczema    No flare ups since starting chemo       Other Visit Diagnoses     Breast cancer screening by mammogram       Relevant Orders   MM 3D SCREENING MAMMOGRAM BILATERAL BREAST   Estradiol deficiency       Relevant Orders   DG Bone Density   Protein-calorie malnutrition, unspecified severity (HCC)       Relevant Orders   TSH    Comp Met (CMET)   CBC w/Diff   Encounter for routine gynecological examination with Papanicolaou smear of cervix       Relevant Orders   Cytology - PAP( [place])   Needs flu shot       Relevant Orders   Flu Vaccine Trivalent High Dose (Fluad) (Completed)   Need for pneumococcal 20-valent conjugate vaccination       Relevant Orders   Pneumococcal conjugate vaccine 20-valent (Prevnar 20) (Completed)       I have discontinued [employer] Standard 1.4, morphine, fentaNYL, prochlorperazine, and ondansetron. I am also having her maintain her multivitamin with minerals, fluticasone, Calcium Carbonate (CALCIUM 600 HIGH POTENCY PO), cholecalciferol, augmented betamethasone dipropionate, omeprazole, acetaminophen, and losartan.  No orders of the defined types were placed in this encounter.

## 2022-12-19 NOTE — Assessment & Plan Note (Signed)
This is being monitored by oncology

## 2022-12-19 NOTE — Assessment & Plan Note (Addendum)
Check follow up TSH especially in setting of recent neck radiation.

## 2022-12-19 NOTE — Assessment & Plan Note (Signed)
No flare ups since starting chemo

## 2022-12-19 NOTE — Assessment & Plan Note (Signed)
Flu shot and Prevnar 20 today in clinic.  Continue advancing diet as tolerated.  Will obtain a dexa/mammo. Pap performed today. Colo up to date. She plans to get the latest covid booster at the pharmacy.

## 2022-12-20 ENCOUNTER — Telehealth: Payer: Self-pay | Admitting: Family

## 2022-12-20 LAB — CYTOLOGY - PAP
Comment: NEGATIVE
Diagnosis: NEGATIVE
High risk HPV: NEGATIVE

## 2022-12-20 NOTE — Telephone Encounter (Signed)
Could you please request add on magnesium to her blood draw?

## 2022-12-21 DIAGNOSIS — R55 Syncope and collapse: Secondary | ICD-10-CM

## 2022-12-21 HISTORY — DX: Syncope and collapse: R55

## 2022-12-21 NOTE — Telephone Encounter (Signed)
What is the dx code for magnesium?

## 2022-12-22 DIAGNOSIS — R55 Syncope and collapse: Secondary | ICD-10-CM | POA: Diagnosis not present

## 2022-12-22 DIAGNOSIS — R296 Repeated falls: Secondary | ICD-10-CM | POA: Diagnosis not present

## 2022-12-22 DIAGNOSIS — W19XXXA Unspecified fall, initial encounter: Secondary | ICD-10-CM | POA: Diagnosis not present

## 2022-12-22 DIAGNOSIS — N289 Disorder of kidney and ureter, unspecified: Secondary | ICD-10-CM | POA: Diagnosis not present

## 2022-12-22 DIAGNOSIS — Z931 Gastrostomy status: Secondary | ICD-10-CM | POA: Diagnosis not present

## 2022-12-22 DIAGNOSIS — E860 Dehydration: Secondary | ICD-10-CM | POA: Diagnosis not present

## 2022-12-22 DIAGNOSIS — S80811A Abrasion, right lower leg, initial encounter: Secondary | ICD-10-CM | POA: Diagnosis not present

## 2022-12-22 DIAGNOSIS — S50812A Abrasion of left forearm, initial encounter: Secondary | ICD-10-CM | POA: Diagnosis not present

## 2022-12-22 DIAGNOSIS — I951 Orthostatic hypotension: Secondary | ICD-10-CM | POA: Diagnosis not present

## 2022-12-22 DIAGNOSIS — Z9221 Personal history of antineoplastic chemotherapy: Secondary | ICD-10-CM | POA: Diagnosis not present

## 2022-12-22 DIAGNOSIS — Z8589 Personal history of malignant neoplasm of other organs and systems: Secondary | ICD-10-CM | POA: Diagnosis not present

## 2022-12-22 DIAGNOSIS — I10 Essential (primary) hypertension: Secondary | ICD-10-CM | POA: Diagnosis not present

## 2022-12-22 DIAGNOSIS — R42 Dizziness and giddiness: Secondary | ICD-10-CM | POA: Diagnosis not present

## 2022-12-22 DIAGNOSIS — J929 Pleural plaque without asbestos: Secondary | ICD-10-CM | POA: Diagnosis not present

## 2022-12-22 DIAGNOSIS — Z79899 Other long term (current) drug therapy: Secondary | ICD-10-CM | POA: Diagnosis not present

## 2022-12-22 DIAGNOSIS — R231 Pallor: Secondary | ICD-10-CM | POA: Diagnosis not present

## 2022-12-22 DIAGNOSIS — Z23 Encounter for immunization: Secondary | ICD-10-CM | POA: Diagnosis not present

## 2022-12-22 DIAGNOSIS — Z923 Personal history of irradiation: Secondary | ICD-10-CM | POA: Diagnosis not present

## 2022-12-23 ENCOUNTER — Encounter: Payer: Self-pay | Admitting: Family

## 2022-12-23 DIAGNOSIS — I959 Hypotension, unspecified: Secondary | ICD-10-CM | POA: Diagnosis not present

## 2022-12-23 DIAGNOSIS — I951 Orthostatic hypotension: Secondary | ICD-10-CM | POA: Diagnosis not present

## 2022-12-23 DIAGNOSIS — E860 Dehydration: Secondary | ICD-10-CM | POA: Diagnosis not present

## 2022-12-23 MED ORDER — FLUTICASONE PROPIONATE 50 MCG/ACT NA SUSP: 2.0000 | Freq: Every day | NASAL | 3 refills | Status: DC

## 2022-12-23 MED ORDER — OMEPRAZOLE 20 MG PO CPDR: 20.0000 mg | DELAYED_RELEASE_CAPSULE | Freq: Every morning | ORAL | 1 refills | Status: DC

## 2022-12-24 ENCOUNTER — Telehealth: Payer: Self-pay | Admitting: *Deleted

## 2022-12-24 ENCOUNTER — Ambulatory Visit: Payer: Medicare Other

## 2022-12-24 LAB — MAGNESIUM: Magnesium: 1.6 mg/dL (ref 1.5–2.5)

## 2022-12-24 NOTE — Addendum Note (Signed)
Addended by: Mervin Kung A on: 12/24/2022 07:34 AM   Modules accepted: Orders

## 2022-12-24 NOTE — Telephone Encounter (Signed)
See mychart.  

## 2022-12-24 NOTE — Telephone Encounter (Signed)
Patient's appointment changed to 12/25/22

## 2022-12-24 NOTE — Telephone Encounter (Signed)
Add on request faxed to [facility] lab requesting to send to [facility] for testing.

## 2022-12-24 NOTE — Telephone Encounter (Signed)
Please request ED records from [facility].   Also, please contact pt to try to get her in to see me in the next 1 week.

## 2022-12-24 NOTE — Telephone Encounter (Signed)
CALLED PATIENT TO INFORM OF CT FOR 01-08-23- ARRIVAL TIME- 1 PM  @ [facility], PATIENT TO HAVE WATER ONLY - 6 HRS. PRIOR TO SCAN, PATIENT TO RECEIVE RESULTS FROM DR. SQUIRE ON 01-11-23 @ 10 AM, SPOKE WITH PATIENT AND SHE IS AWARE OF THESE APPTS. AND THE INSTRUCTIONS

## 2022-12-25 ENCOUNTER — Ambulatory Visit (INDEPENDENT_AMBULATORY_CARE_PROVIDER_SITE_OTHER): Payer: Medicare Other | Admitting: Family

## 2022-12-25 ENCOUNTER — Other Ambulatory Visit: Payer: Self-pay | Admitting: Family

## 2022-12-25 ENCOUNTER — Encounter: Payer: Self-pay | Admitting: Family

## 2022-12-25 DIAGNOSIS — Z1231 Encounter for screening mammogram for malignant neoplasm of breast: Secondary | ICD-10-CM

## 2022-12-25 DIAGNOSIS — I951 Orthostatic hypotension: Secondary | ICD-10-CM | POA: Diagnosis not present

## 2022-12-25 DIAGNOSIS — C439 Malignant melanoma of skin, unspecified: Secondary | ICD-10-CM | POA: Diagnosis not present

## 2022-12-25 DIAGNOSIS — I10 Essential (primary) hypertension: Secondary | ICD-10-CM

## 2022-12-25 HISTORY — DX: Malignant melanoma of skin, unspecified: C43.9

## 2022-12-26 ENCOUNTER — Telehealth: Payer: Self-pay | Admitting: Family

## 2022-12-26 ENCOUNTER — Encounter: Payer: Self-pay | Admitting: Family

## 2022-12-26 DIAGNOSIS — I951 Orthostatic hypotension: Secondary | ICD-10-CM | POA: Insufficient documentation

## 2022-12-26 HISTORY — DX: Orthostatic hypotension: I95.1

## 2022-12-26 MED ORDER — MAGNESIUM 500 MG PO CAPS: 1.0000 | ORAL_CAPSULE | Freq: Every day | ORAL | Status: AC

## 2022-12-26 NOTE — Assessment & Plan Note (Signed)
She has been holding losartan since discharge.  I advised her to continue to hold losartan.   Continue to BP monitor it daily and if SBP consistently goes above 125, let me know as we may need to restart your blood pressure medication, Losartan.

## 2022-12-26 NOTE — Patient Instructions (Signed)
VISIT SUMMARY:  During your recent visit, we discussed your recent fainting episode, which was likely due to dehydration and low blood pressure. We also talked about your high blood pressure, low magnesium levels, and recent diagnosis of skin cancer. We discussed the importance of staying hydrated, eating well, and monitoring your blood pressure. We also talked about your recent weight loss and the possibility of using nutritional supplements like Ensure.  YOUR PLAN:  -FAINTING EPISODE: Your fainting episode was likely due to not drinking enough water and low blood pressure. It's important to stay hydrated and eat well to prevent this from happening again.  -HIGH BLOOD PRESSURE: Your blood pressure is currently under control without medication. Continue to monitor it daily and if it consistently goes above 125, we may need to restart your blood pressure medication, Losartan.  -LOW MAGNESIUM LEVELS: Your low magnesium levels were corrected in the hospital. To keep them at a healthy level, make sure to eat well and consider taking a magnesium supplement until you're eating and drinking normally. We'll also repeat some lab tests to monitor your magnesium, potassium, and kidney function.  -SKIN CANCER: A small skin cancer lesion was identified by your dermatologist. You'll need to follow up with them for further treatment, which will likely involve removing more of the skin around the lesion.  INSTRUCTIONS:  Please continue with your current immunizations, including your flu shot, pneumonia shot, COVID vaccine, and tetanus shot. Consider using a nutritional supplement like Ensure to help prevent dehydration and maintain a healthy balance of electrolytes in your body. We'll follow up in a month to see how you're doing.

## 2022-12-26 NOTE — Progress Notes (Signed)
Subjective:     Patient ID: Nichole Dominguez, female    DOB: [birth year], 67 y.o.   MRN: [id number]  Chief Complaint  Patient presents with   Follow-up    Here for hospital follow up    HPI  Discussed the use of AI scribe software for clinical note transcription with the patient, who gave verbal consent to proceed.  History of Present Illness   The patient, with a history of hypertension, presents after a syncopal episode. She reports feeling unwell, with a sensation of lightheadedness 'like if you get up too fast,' prior to losing consciousness. This occured over this past weekend.  She was not eating or drinking adequately at the time, and had only consumed a bottle of water that day. She was subsequently admitted to the hospital, where she was found to be dehydrated and hypomagnesemic. She was treated with IV fluids and magnesium. Since discharge, she has not had any further episodes of syncope or dizziness.  The patient also reports a recent weight loss of 30 pounds and a current intake of less than 1000 calories per day. She is not currently using her feeding tube (per instruction of oncology), but is flushing it with 16 ounces of water daily. She is considering supplementing her diet with Ensure.  In addition, the patient recently had a skin biopsy of her right posterior shoulder that was positive for melanoma. She is awaiting further surgical resection of this with her dermatologist.     Records reviewed from [facility] admission.     Health Maintenance Due  Topic Date Due   Medicare Annual Wellness (AWV)  Never done   HIV Screening  Never done   Hepatitis C Screening  Never done   MAMMOGRAM  01/03/2021   DEXA SCAN  01/03/2022    Past Medical History:  Diagnosis Date   Allergy    GERD (gastroesophageal reflux disease)    Melanoma (HCC) 12/25/2022   right posterior shoulder ([facility])   Osteopenia 01/04/2020   Plantar fasciitis    Seasonal allergies      Past Surgical History:  Procedure Laterality Date   COLONOSCOPY  2011   IR GASTROSTOMY TUBE MOD SED  08/21/2022   IR IMAGING GUIDED PORT INSERTION  08/21/2022   IR PATIENT EVAL TECH 0-60 MINS  08/31/2022   IR PATIENT EVAL TECH 0-60 MINS  09/07/2022   MOUTH SURGERY  01/2020   implant inserted   TUBAL LIGATION  1992    Family History  Problem Relation Age of Onset   Diabetes Mother    Heart disease Mother        chf   Hypertension Mother    Cervical cancer Mother    Obesity Mother    Colon polyps Mother    Thyroid cancer Father    Cancer Father        thyroid x2   Heart attack Father    Breast cancer Sister    Lupus Sister    Obesity Maternal Grandmother    Diabetes Maternal Grandmother    Heart disease Maternal Grandmother    Hypertension Maternal Grandmother    Stroke Maternal Grandmother    Diabetes Mellitus II Paternal Grandmother        died from diabetic coma   Cancer Paternal Grandfather        not sure what type   Heart attack Son    Esophageal cancer Neg Hx    Stomach cancer Neg Hx    Rectal cancer  Neg Hx    Colon cancer Neg Hx     Social History   Socioeconomic History   Marital status: Married    Spouse name: Not on file   Number of children: Not on file   Years of education: Not on file   Highest education level: Some college, no degree  Occupational History   Not on file  Tobacco Use   Smoking status: Former    Current packs/day: 0.00    Average packs/day: 1 pack/day for 24.0 years (24.0 ttl pk-yrs)    Types: Cigarettes    Start date: 10/10/1972    Quit date: 10/10/1996    Years since quitting: 26.2   Smokeless tobacco: Never  Vaping Use   Vaping status: Never Used  Substance and Sexual Activity   Alcohol use: Yes    Alcohol/week: 0.0 standard drinks of alcohol    Comment: 1 shot vodka daily   Drug use: No   Sexual activity: Yes    Partners: Male  Other Topics Concern   Not on file  Social History Narrative   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    Lives with husband   1 daughter in [place], 82 grandchildren   Son died   2 grown stepchildren- both Designer, multimedia, grandchildren   Enjoys Corporate investment banker   Social Determinants of Health   Financial Resource Strain: Low Risk  (12/24/2022)   Overall Financial Resource Strain (CARDIA)    Difficulty of Paying Living Expenses: Not hard at all  Food Insecurity: No Food Insecurity (12/24/2022)   Hunger Vital Sign    Worried About Running Out of Food in the Last Year: Never true    Ran Out of Food in the Last Year: Never true  Transportation Needs: No Transportation Needs (12/24/2022)   PRAPARE - Administrator, Civil Service (Medical): No    Lack of Transportation (Non-Medical): No  Physical Activity: Unknown (12/24/2022)   Exercise Vital Sign    Days of Exercise per Week: 0 days    Minutes of Exercise per Session: Not on file  Stress: No Stress Concern Present (12/24/2022)   [employer] of Occupational Health - Occupational Stress Questionnaire    Feeling of Stress : Not at all  Social Connections: Moderately Isolated (12/24/2022)   Social Connection and Isolation Panel [NHANES]    Frequency of Communication with Friends and Family: More than three times a week    Frequency of Social Gatherings with Friends and Family: Twice a week    Attends Religious Services: Never    Database administrator or Organizations: No    Attends Engineer, structural: Not on file    Marital Status: Married  Catering manager Violence: Not At Risk (08/09/2022)   Humiliation, Afraid, Rape, and Kick questionnaire    Fear of Current or Ex-Partner: No    Emotionally Abused: No    Physically Abused: No    Sexually Abused: No    Outpatient Medications Prior to Visit  Medication Sig Dispense Refill   acetaminophen (TYLENOL) 500 MG tablet Take 1,500 mg by mouth every 6 (six) hours as needed.     augmented betamethasone dipropionate (DIPROLENE-AF) 0.05 % ointment Apply  topically 2 (two) times daily. 15 g 0   Calcium Carbonate (CALCIUM 600 HIGH POTENCY PO) Take 3 capsules by mouth daily.     cholecalciferol (VITAMIN D3) 25 MCG (1000 UNIT) tablet Take 1,000 Units by mouth daily.     fluticasone (FLONASE) 50  MCG/ACT nasal spray Place 2 sprays into both nostrils daily. 48 g 3   Multiple Vitamins-Minerals (MULTIVITAMIN WITH MINERALS) tablet Take 1 tablet by mouth daily.     omeprazole (PRILOSEC) 20 MG capsule Take 1 capsule (20 mg total) by mouth every morning. 90 capsule 1   losartan (COZAAR) 50 MG tablet Take 1 tablet (50 mg total) by mouth daily. (Patient not taking: Reported on 12/25/2022) 90 tablet 0   No facility-administered medications prior to visit.    Allergies  Allergen Reactions   Amlodipine     swelling   Darvon [Propoxyphene] Rash   Sudafed [Pseudoephedrine Hcl]     rash    ROS See HPI    Objective:    Physical Exam Constitutional:      General: She is not in acute distress.    Appearance: Normal appearance. She is well-developed.  HENT:     Head: Normocephalic and atraumatic.     Right Ear: External ear normal.     Left Ear: External ear normal.  Eyes:     General: No scleral icterus. Neck:     Thyroid: No thyromegaly.  Cardiovascular:     Rate and Rhythm: Normal rate and regular rhythm.     Heart sounds: Normal heart sounds. No murmur heard. Pulmonary:     Effort: Pulmonary effort is normal. No respiratory distress.     Breath sounds: Normal breath sounds. No wheezing.  Musculoskeletal:     Cervical back: Neck supple.  Skin:    General: Skin is warm and dry.  Neurological:     Mental Status: She is alert and oriented to person, place, and time.  Psychiatric:        Mood and Affect: Mood normal.        Behavior: Behavior normal.        Thought Content: Thought content normal.        Judgment: Judgment normal.      BP 119/80 (BP Location: Right Arm, Patient Position: Sitting, Cuff Size: Small)   Pulse 90   Temp  98.1 F (36.7 C) (Oral)   Resp 16   Wt 134 lb (60.8 kg)   SpO2 100%   BMI 25.32 kg/m  Wt Readings from Last 3 Encounters:  12/25/22 134 lb (60.8 kg)  12/19/22 133 lb (60.3 kg)  10/31/22 141 lb 9.6 oz (64.2 kg)       Assessment & Plan:   Problem List Items Addressed This Visit       Unprioritized   Orthostatic syncope    She was brought by EMS to [facility] and was noted to be dehydrated with acute renal insufficiency and dehydration.  She was observed overnight.  She has been feeling well since she returned home. Reinforced the importance of adequate nutritional intake and hydration. Will update lab work today. Add magnesium supplement 500 mg once daily otc. I also encouraged her to add one ensure a day at least to support her nutrition.      Relevant Orders   Comp Met (CMET) (Completed)   CBC w/Diff (Completed)   Melanoma (HCC)    Recent finding. Right posterior shoulder- defer further management/excision to dermatology.       Hypomagnesemia - Primary   Relevant Orders   Magnesium (Completed)    I am having Nichole Dominguez maintain her multivitamin with minerals, Calcium Carbonate (CALCIUM 600 HIGH POTENCY PO), cholecalciferol, augmented betamethasone dipropionate, acetaminophen, losartan, fluticasone, and omeprazole.  No orders of the defined types were  placed in this encounter.

## 2022-12-26 NOTE — Assessment & Plan Note (Addendum)
She was brought by EMS to [facility] and was noted to be dehydrated with acute renal insufficiency and dehydration.  She was observed overnight.  She has been feeling well since she returned home. Reinforced the importance of adequate nutritional intake and hydration. Will update lab work today. Add magnesium supplement 500 mg once daily otc. I also encouraged her to add one ensure a day at least to support her nutrition.

## 2022-12-26 NOTE — Assessment & Plan Note (Signed)
Recent finding. Right posterior shoulder- defer further management/excision to dermatology.

## 2022-12-26 NOTE — Telephone Encounter (Signed)
See mychart.  

## 2022-12-31 ENCOUNTER — Ambulatory Visit: Payer: Medicare Other | Admitting: Physical Therapy

## 2023-01-01 ENCOUNTER — Inpatient Hospital Stay: Payer: Medicare Other | Attending: Hematology and Oncology | Admitting: Hematology and Oncology

## 2023-01-01 VITALS — BP 121/72 | HR 93 | Temp 97.9°F | Resp 17 | Wt 133.2 lb

## 2023-01-01 DIAGNOSIS — Z9221 Personal history of antineoplastic chemotherapy: Secondary | ICD-10-CM | POA: Insufficient documentation

## 2023-01-01 DIAGNOSIS — C770 Secondary and unspecified malignant neoplasm of lymph nodes of head, face and neck: Secondary | ICD-10-CM | POA: Diagnosis not present

## 2023-01-01 DIAGNOSIS — I959 Hypotension, unspecified: Secondary | ICD-10-CM | POA: Diagnosis not present

## 2023-01-01 DIAGNOSIS — Z8520 Personal history of malignant neoplasm of unspecified respiratory organ: Secondary | ICD-10-CM | POA: Diagnosis not present

## 2023-01-01 DIAGNOSIS — Z923 Personal history of irradiation: Secondary | ICD-10-CM | POA: Diagnosis not present

## 2023-01-01 DIAGNOSIS — I890 Lymphedema, not elsewhere classified: Secondary | ICD-10-CM | POA: Diagnosis not present

## 2023-01-01 NOTE — Progress Notes (Signed)
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 She completed treatment on 10-08-22. Pt will have Pet scan on 01-08-23.   Pain issues, if any: no pain at this time Using a feeding tube?: still has feeding tube, just flushing now, still doing boost and ensures, still working getting weight stable Weight changes, if any:  Wt Readings from Last 3 Encounters:  01/11/23 128 lb 2 oz (58.1 kg)  01/01/23 133 lb 3.2 oz (60.4 kg)  12/25/22 134 lb (60.8 kg)   Swallowing issues, if any: no swallowing concerns Smoking or chewing tobacco? No  Using fluoride toothpaste daily? no Last ENT visit was on: has not been since diagnosis, going to Dr. Irene Limbo in November Other notable issues, if any: Pt really wants feeding tube out. No other major concerns or questions.   Vitals:   01/11/23 0955  BP: 132/70  Pulse: 90  Resp: 18  Temp: (!) 97.3 F (36.3 C)  SpO2: 99%

## 2023-01-01 NOTE — Progress Notes (Signed)
[facility] Cancer Follow up:    Nichole Craze, NP [address] [place] [address]   DIAGNOSIS:  Cancer Staging  Secondary malignant neoplasm of cervical lymph node (HCC) Staging form: Pharynx - HPV-Mediated Oropharynx, AJCC 8th Edition - Clinical stage from 08/10/2022: Stage I (cT0, cN1, cM0, p16+) - Signed by Lonie Peak, MD on 08/11/2022 Stage prefix: Initial diagnosis   SUMMARY OF ONCOLOGIC HISTORY: Oncology History  Secondary malignant neoplasm of cervical lymph node (HCC)  06/26/2022 Imaging   Neck CT: a 4.4 cm x 3.0 cm x 4.1 cm soft tissue mass centered in the right parapharyngeal space, favored to be arising from the deep lobe of the right parotid gland, 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 The mass appeared to encase a portion of the right external carotid artery and partially encases the right internal carotid artery Prominent right-sided cervical and upper mediastinal  lymph nodes were also appreciated, suspicious for metastatic spread of  disease    07/24/2022 Imaging   MRI of the face and neck  showed the infiltrative mass lesion centered in the right carotid, parapharyngeal and parotid spaces with extensive surrounding local invasion, and findings concerning for metastatic adenopathy in the right neck and additional nonspecific mildly enlarged and rounded lymph nodes involving the upper mediastinum and contralateral left jugular chain. MRI also showed evidence of tumor extension within close proximity to multiple skill base foramina, along with evidence of right hypoglossal nerve involvement contributing to right hemitongue denervation changes, and tumor thrombus within the encased right internal jugular vein. MRI otherwise showed no evidence of intracranial perineural tumor spread.    07/27/2022 Initial Biopsy   FNA of the right neck mass showed carcinoma with squamous features, favoring squamous cell carcinoma (however,  other tumors with squamous differentiation cannot entirely be excluded); P16 diffusely positive; HPV focally positive.    08/02/2022 PET scan   Whole body PET scan: the dominant right level IIa metastatic nodal mass measuring 4.9 x 2.5 cm with an SUV max of 15; multiple ipsilateral nodal metastases involving the right level 3, level 4, and level 5 lymph node stations including the dominant right level 3 nodal metastasis (measuring 1.1 cm) coming in contact with the right oropharynx without discrete primary tumor; FDG avidity within the left palatine tonsil (normal in size and with a max SUV of 4.4); and mildly FDG avid mediastinal, bihilar, and subcarinal lymph nodes. No other sites concerning for metastatic disease were appreciated elsewhere in the body.    08/10/2022 Cancer Staging   Staging form: Pharynx - HPV-Mediated Oropharynx, AJCC 8th Edition - Clinical stage from 08/10/2022: Stage I (cT0, cN1, cM0, p16+) - Signed by Lonie Peak, MD on 08/11/2022 Stage prefix: Initial diagnosis   08/21/2022 Procedure   Port placement and G-tube placement   08/23/2022 - 10/08/2022 Radiation Therapy   Concurrent radiation   08/24/2022 - 10/05/2022 Chemotherapy   Patient is on Treatment Plan : HEAD/NECK Cisplatin (40) q7d       CURRENT THERAPY: chemoradiation  INTERVAL HISTORY:  Nichole Dominguez 67 y.o. female returns for follow up.   Discussed the use of AI scribe software for clinical note transcription with the patient, who gave verbal consent to proceed.  History of Present Illness   The patient, with a history of cancer and hypertension, presents for follow-up after a recent ER visit for hypotension. She reports that her primary care physician discontinued her antihypertensive medication due to significant weight loss. She also  mentions that her kidney function was affected but has since returned to normal. The patient is scheduled to see an ENT specialist in November for follow-up. The patient reports a  lumpy neck, which she believes is due to lymphedema. The patient has stopped using her feeding tube about three weeks ago and has been trying to maintain her weight through oral intake. She reports a weight loss of about 10 pounds since discontinuing the feeding tube, but her weight has stabilized at 130 pounds for the past couple of weeks. She has been trying to increase her caloric intake by eating as much as she can and supplementing with Ensure.  The patient also mentions that her primary care physician prescribed a magnesium supplement due to persistently low levels.       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  MEDICAL HISTORY: Past Medical History:  Diagnosis Date   Allergy    GERD (gastroesophageal reflux disease)    Melanoma (HCC) 12/25/2022   right posterior shoulder ([facility])   Osteopenia 01/04/2020   Plantar fasciitis    Seasonal allergies     SURGICAL HISTORY: Past Surgical History:  Procedure Laterality Date   COLONOSCOPY  2011   IR GASTROSTOMY TUBE MOD SED  08/21/2022   IR IMAGING GUIDED PORT INSERTION  08/21/2022   IR PATIENT EVAL TECH 0-60 MINS  08/31/2022   IR PATIENT EVAL TECH 0-60 MINS   09/07/2022   MOUTH SURGERY  01/2020   implant inserted   TUBAL LIGATION  1992    SOCIAL HISTORY: Social History   Socioeconomic History   Marital status: Married    Spouse name: Not on file   Number of children: Not on file   Years of education: Not on file   Highest education level: Some college, no degree  Occupational History   Not on file  Tobacco Use   Smoking status: Former    Current packs/day: 0.00    Average packs/day: 1 pack/day for 24.0 years (24.0 ttl pk-yrs)    Types: Cigarettes    Start date: 10/10/1972    Quit date: 10/10/1996    Years since quitting: 26.2   Smokeless tobacco: Never  Vaping Use   Vaping status: Never Used  Substance and Sexual Activity   Alcohol use: Yes    Alcohol/week: 0.0 standard drinks of alcohol    Comment: 1 shot vodka daily   Drug use: No   Sexual activity: Yes    Partners: Male  Other Topics Concern   Not on file  Social History Narrative   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    Lives with husband   1 daughter in [place], 79 grandchildren   Son died   2 grown stepchildren- both Designer, multimedia, grandchildren   Enjoys Corporate investment banker  Social Determinants of Health   Financial Resource Strain: Low Risk  (12/24/2022)   Overall Financial Resource Strain (CARDIA)    Difficulty of Paying Living Expenses: Not hard at all  Food Insecurity: No Food Insecurity (12/24/2022)   Hunger Vital Sign    Worried About Running Out of Food in the Last Year: Never true    Ran Out of Food in the Last Year: Never true  Transportation Needs: No Transportation Needs (12/24/2022)   PRAPARE - Administrator, Civil Service (Medical): No    Lack of Transportation (Non-Medical): No  Physical Activity: Unknown (12/24/2022)   Exercise Vital Sign    Days of Exercise per Week: 0 days    Minutes of Exercise per Session: Not on file  Stress: No Stress Concern Present (12/24/2022)   [employer] of Occupational Health - Occupational  Stress Questionnaire    Feeling of Stress : Not at all  Social Connections: Moderately Isolated (12/24/2022)   Social Connection and Isolation Panel [NHANES]    Frequency of Communication with Friends and Family: More than three times a week    Frequency of Social Gatherings with Friends and Family: Twice a week    Attends Religious Services: Never    Database administrator or Organizations: No    Attends Engineer, structural: Not on file    Marital Status: Married  Catering manager Violence: Not At Risk (08/09/2022)   Humiliation, Afraid, Rape, and Kick questionnaire    Fear of Current or Ex-Partner: No    Emotionally Abused: No    Physically Abused: No    Sexually Abused: No    FAMILY HISTORY: Family History  Problem Relation Age of Onset   Diabetes Mother    Heart disease Mother        chf   Hypertension Mother    Cervical cancer Mother    Obesity Mother    Colon polyps Mother    Thyroid cancer Father    Cancer Father        thyroid x2   Heart attack Father    Breast cancer Sister    Lupus Sister    Obesity Maternal Grandmother    Diabetes Maternal Grandmother    Heart disease Maternal Grandmother    Hypertension Maternal Grandmother    Stroke Maternal Grandmother    Diabetes Mellitus II Paternal Grandmother        died from diabetic coma   Cancer Paternal Grandfather        not sure what type   Heart attack Son    Esophageal cancer Neg Hx    Stomach cancer Neg Hx    Rectal cancer Neg Hx    Colon cancer Neg Hx         PHYSICAL EXAMINATION  See [facility] for vitals from today's visit  Physical Exam Constitutional:      General: She is not in acute distress.    Appearance: Normal appearance. She is not ill-appearing or toxic-appearing.  HENT:     Head: Normocephalic and atraumatic.     Mouth/Throat:     Mouth: Mucous membranes are moist.     Pharynx: Oropharynx is clear. No oropharyngeal exudate or posterior oropharyngeal erythema.  Eyes:      General: No scleral icterus. Cardiovascular:     Rate and Rhythm: Normal rate and regular rhythm.     Pulses: Normal pulses.     Heart sounds: Normal heart sounds.  Pulmonary:     Effort:  Pulmonary effort is normal.     Breath sounds: Normal breath sounds.  Abdominal:     General: Abdomen is flat. Bowel sounds are normal. There is no distension.     Palpations: Abdomen is soft.     Tenderness: There is no abdominal tenderness.  Musculoskeletal:        General: No swelling.     Cervical back: Neck supple.  Lymphadenopathy:     Cervical: No cervical adenopathy.  Skin:    General: Skin is warm and dry.     Findings: No rash.  Neurological:     General: No focal deficit present.     Mental Status: She is alert.  Psychiatric:        Mood and Affect: Mood normal.        Behavior: Behavior normal.     LABORATORY DATA:  CBC    Component Value Date/Time   WBC 6.5 12/25/2022 1600   RBC 3.86 (L) 12/25/2022 1600   HGB 11.7 (L) 12/25/2022 1600   HGB 10.1 (L) 10/31/2022 0806   HCT 34.7 (L) 12/25/2022 1600   PLT 346.0 12/25/2022 1600   PLT 426 (H) 10/31/2022 0806   MCV 90.0 12/25/2022 1600   MCH 32.5 10/31/2022 0806   MCHC 33.7 12/25/2022 1600   RDW 13.2 12/25/2022 1600   LYMPHSABS 0.9 12/25/2022 1600   MONOABS 0.6 12/25/2022 1600   EOSABS 0.8 (H) 12/25/2022 1600   BASOSABS 0.1 12/25/2022 1600    CMP     Component Value Date/Time   NA 139 12/25/2022 1600   K 3.7 12/25/2022 1600   CL 101 12/25/2022 1600   CO2 29 12/25/2022 1600   GLUCOSE 94 12/25/2022 1600   BUN 8 12/25/2022 1600   CREATININE 0.87 12/25/2022 1600   CREATININE 0.78 10/31/2022 0806   CREATININE 1.02 (H) 12/28/2019 0749   CALCIUM 9.5 12/25/2022 1600   PROT 6.2 12/25/2022 1600   ALBUMIN 3.8 12/25/2022 1600   AST 7 12/25/2022 1600   AST 6 (L) 10/31/2022 0806   ALT 8 12/25/2022 1600   ALT 9 10/31/2022 0806   ALKPHOS 69 12/25/2022 1600   BILITOT 0.3 12/25/2022 1600   BILITOT 0.3 10/31/2022 0806    GFRNONAA >60 10/31/2022 0806     ASSESSMENT and THERAPY PLAN:   This is a very pleasant 67 year old female patient with no significant past medical history referred to medical oncology for squamous cell carcinoma, unknown primary, HPV positive for consideration of concurrent chemoradiation.  She completed concurrent chemoradiation.  Assessment and Plan  Hypotension Likely secondary to overcorrection of hypertension. Primary care provider has since discontinued antihypertensive medication. No current symptoms of hypotension reported. -Continue current management plan as directed by primary care provider.  Renal function Recent concern for renal impairment, possibly secondary to hypotension. Labs from September 17th show normal renal function. -Monitor renal function as needed.  SCC unknown primary. Treated with chemotherapy in May and June. No new symptoms reported. Follow-up with ENT scheduled for November. -Continue with scheduled follow-up appointments.  Lymphedema Noted in the neck. Patient has an appointment with physical therapy on September 30th. -Continue with physical therapy as scheduled.  Nutrition/Weight loss Patient has lost 10 pounds since discontinuing use of feeding tube three weeks ago. Currently maintaining weight at 130 pounds. Patient is consuming less than 1000 calories per day, but is trying to increase intake with Ensure and collagen supplements. -Encourage patient to increase caloric intake, possibly with additional Ensure drinks. -Consider follow-up with nutritionist. -If weight  remains stable for the next 2-3 weeks, consider removal of feeding tube.  General Health Maintenance -PET scan scheduled for October 1st. -Follow-up with Dr. Basilio Cairo on October 4th. -Continue magnesium supplement as prescribed by primary care provider. -Next follow-up in one year, unless new issues arise.         All questions were answered. The patient knows to call the clinic  with any problems, questions or concerns. We can certainly see the patient much sooner if necessary.  Total encounter time:30 minutes*in face-to-face visit time, chart review, lab review, care coordination, order entry, and documentation of the encounter time.  *Total Encounter Time as defined by the Centers for Medicare and Medicaid Services includes, in addition to the face-to-face time of a patient visit (documented in the note above) non-face-to-face time: obtaining and reviewing outside history, ordering and reviewing medications, tests or procedures, care coordination (communications with other health care professionals or caregivers) and documentation in the medical record.

## 2023-01-02 ENCOUNTER — Encounter: Payer: Self-pay | Admitting: Family

## 2023-01-02 ENCOUNTER — Ambulatory Visit: Payer: Medicare Other | Admitting: Family

## 2023-01-03 ENCOUNTER — Encounter: Payer: Self-pay | Admitting: Family

## 2023-01-06 ENCOUNTER — Encounter: Payer: Self-pay | Admitting: Family

## 2023-01-07 ENCOUNTER — Encounter: Payer: Self-pay | Admitting: Physical Therapy

## 2023-01-07 ENCOUNTER — Ambulatory Visit: Payer: Medicare Other | Attending: Radiation Oncology | Admitting: Physical Therapy

## 2023-01-07 DIAGNOSIS — R293 Abnormal posture: Secondary | ICD-10-CM | POA: Insufficient documentation

## 2023-01-07 DIAGNOSIS — Y842 Radiological procedure and radiotherapy as the cause of abnormal reaction of the patient, or of later complication, without mention of misadventure at the time of the procedure: Secondary | ICD-10-CM | POA: Insufficient documentation

## 2023-01-07 DIAGNOSIS — M25611 Stiffness of right shoulder, not elsewhere classified: Secondary | ICD-10-CM | POA: Diagnosis not present

## 2023-01-07 DIAGNOSIS — C801 Malignant (primary) neoplasm, unspecified: Secondary | ICD-10-CM | POA: Diagnosis not present

## 2023-01-07 DIAGNOSIS — C770 Secondary and unspecified malignant neoplasm of lymph nodes of head, face and neck: Secondary | ICD-10-CM | POA: Diagnosis not present

## 2023-01-07 DIAGNOSIS — I890 Lymphedema, not elsewhere classified: Secondary | ICD-10-CM | POA: Insufficient documentation

## 2023-01-07 DIAGNOSIS — L598 Other specified disorders of the skin and subcutaneous tissue related to radiation: Secondary | ICD-10-CM | POA: Diagnosis not present

## 2023-01-07 DIAGNOSIS — L599 Disorder of the skin and subcutaneous tissue related to radiation, unspecified: Secondary | ICD-10-CM

## 2023-01-07 NOTE — Therapy (Signed)
OUTPATIENT PHYSICAL THERAPY BREAST CANCER POST OP FOLLOW UP   Patient Name: Nichole Dominguez MRN: [id number] DOB:11/17/1955, 67 y.o., female Today's Date: 01/07/2023  END OF SESSION:  PT End of Session - 01/07/23 1608     Visit Number 2    Number of Visits 10    Date for PT Re-Evaluation 02/04/23    PT Start Time 1607    PT Stop Time 1640    PT Time Calculation (min) 33 min    Activity Tolerance Patient tolerated treatment well    Behavior During Therapy WFL for tasks assessed/performed             Past Medical History:  Diagnosis Date   Allergy    GERD (gastroesophageal reflux disease)    Melanoma (HCC) 12/25/2022   right posterior shoulder ([facility])   Osteopenia 01/04/2020   Plantar fasciitis    Seasonal allergies    Past Surgical History:  Procedure Laterality Date   COLONOSCOPY  2011   IR GASTROSTOMY TUBE MOD SED  08/21/2022   IR IMAGING GUIDED PORT INSERTION  08/21/2022   IR PATIENT EVAL TECH 0-60 MINS  08/31/2022   IR PATIENT EVAL TECH 0-60 MINS  09/07/2022   MOUTH SURGERY  01/2020   implant inserted   TUBAL LIGATION  1992   Patient Active Problem List   Diagnosis Date Noted   Orthostatic syncope 12/26/2022   Melanoma (HCC) 12/25/2022   Pancytopenia, acquired (HCC) 10/12/2022   Cancer associated pain 10/12/2022   Other constipation 10/12/2022   Severe protein-calorie malnutrition (HCC) 10/12/2022   Hypomagnesemia 10/12/2022   Port-A-Cath in place 08/24/2022   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   Lower extremity edema 10/03/2021   Hypertension 08/23/2021   Eczema 12/01/2020   Osteopenia 01/04/2020   Pap smear for cervical cancer screening 10/06/2014   Thyroid nodule 10/06/2014   Routine general medical examination at a health care facility 08/21/2013    PCP: [name]  REFERRING PROVIDER: Lonie Peak, MD  REFERRING DIAG: C77.0 (ICD-10-CM) - Secondary  malignant neoplasm of cervical lymph node (HCC)   THERAPY DIAG:  Lymphedema, not elsewhere classified  Disorder of the skin and subcutaneous tissue related to radiation, unspecified  Abnormal posture  Secondary malignant neoplasm of cervical lymph node (HCC)  Rationale for Evaluation and Treatment: Rehabilitation  ONSET DATE: 07/27/22  SUBJECTIVE:                                                                                                                                                                                           SUBJECTIVE  STATEMENT: The swelling started almost a month ago. It keeps getting bigger and bigger.   PERTINENT HISTORY:  Secondary malignant neoplasm of cervical lymph node, Stage I (T0,N1, M0, p 16 +), staging does not acknowledge the bulky and infiltrative nature of her dominant neck mass. 05/07/22 She saw Dr. Marene Lenz and laryngoscopy was performed which revealed a relatively indurated mass lesion extending from the mid parotid gland to level II in the neck measuring approx. 4 cm. Biopsy of the parotid mass was non diagnostic. 06/26/22 CT neck completed after she began to develop jaw pain and right neck pain. 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 The mass appeared to encase a portion of the right external carotid artery and partially encases the right internal carotid artery Prominent right-sided cervical and upper mediastinal lymph nodes were also appreciated, suspicious for metastatic spread of disease. 07/17/22 She saw Dr. Christoper Allegra who did a Laryngoscopy performed during this visit was notable for mild right pharyngeal fullness, mild right vocal fold hypomobility, and limited anterior movement of the right tongue. Based on CT findings, Dr. Christoper Allegra recommended proceeding with repeat IR guided  biopsy of the mass, as well as staging work-up including an MRI of the neck and PET scan. 07/27/22 MRI of the face showed the infiltrative mass lesion centered in the right carotid, parapharyngeal and parotid spaces with extensive surrounding local invasion, and findings concerning for metastatic adenopathy in the right neck and additional nonspecific mildly enlarged and rounded lymph nodes involving the upper mediastinum and contralateral left jugular chain. MRI also showed evidence of tumor extension within close proximity to multiple skill base foramina, along with evidence of right hypoglossal nerve involvement contributing to right hemitongue denervation changes, and tumor thrombus within the encased right internal jugular vein. MRI otherwise showed no evidence of intracranial perineural tumor spread. 07/27/22 FNA of the right neck mass collected on 07/27/22 showed carcinoma with squamous features, favoring squamous cell carcinoma (however, other tumors with squamous differentiation cannot entirely be excluded); P16 diffusely positive; HPV focally positive. 08/02/22 PET demonstrated  the dominant right level IIa metastatic nodal mass measuring 4.9 x 2.5 cm with an SUV max of 15; multiple ipsilateral nodal metastases involving the right level 3, level 4, and level 5 lymph node stations including the dominant right level 3 nodal metastasis (measuring 1.1 cm) coming in contact with the right oropharynx without discrete primary tumor; FDG avidity within the left palatine tonsil (normal in size and with a max SUV of 4.4); and mildly FDG avid mediastinal, bihilar, and subcarinal lymph nodes. No other sites concerning for metastatic disease were appreciated elsewhere in the body. Given that there is encasement of the carotid, surgical resection was not advised by Dr. Christoper Allegra and chemo/radiation was recommended. She will receive 35 fractions of radiation to her Oropharynx and bilateral neck with weekly cisplatin. She  started radiation on 08/20/22 and will start chemo on 08/24/22 and will complete 10/08/22. 08/21/22 PEG/PAC    PATIENT GOALS:  Reassess how my recovery is going related to arm function, pain, and swelling.  PAIN:  Are you having pain? No  PRECAUTIONS: Head and neck radiation  RED FLAGS: None   ACTIVITY LEVEL / LEISURE: none   OBJECTIVE:   COGNITION: Overall cognitive status: Within functional limits for tasks assessed  POSTURE:  Forward head and rounded shoulders posture   30 SEC SIT TO STAND: 14 reps in 30 sec without use of UEs which is  Average for patient's age   SHOULDER AROM:   Impaired  L shoulder ROM is WFL, R shoulder ROM is limited to about 90 degrees of flexion and approx 45 degrees of abduction, pt reports she thinks it may be from the tumor. She also reports she felt like she tore something in her shoulder and reports she could hear it and it was painful then.      CERVICAL AROM:     Percent limited 01/07/23  Flexion WFL WFL  Extension [facility] WFL  Right lateral flexion [facility] WFL  Left lateral flexion [facility] WFL  Right rotation 30% limited WFL  Left rotation WFL WFL+                          (Blank rows=not tested)    LYMPHEDEMA ASSESSMENT:    Circumference in cm  4 cm superior to sternal notch around neck 36.5  6 cm superior to sternal notch around neck 35.6  8 cm superior to sternal notch around neck 37  R lateral nostril from base of nose to medial tragus   L lateral nostril from base of nose to medial tragus   R corner of mouth to where ear lobe meets face   L corner of mouth to where ear lobe meets face   10 cm superior to sternal notch around neck 38.2     (Blank rows=not tested)  TREATMENT PERFORMED: 01/07/23: Created foam chip for pt to wear around head and neck for compression Instructed pt in basic MLD for anterior neck as follows and had her return demonstrate seated in front of mirror: 5 diaphragmatic breaths, bilateral axilla,  bilateral pectoral nodes, short neck, anterior neck then retracing steps while providing v/c for correct sequence and skin stretch  PATIENT EDUCATION:  Education details: self MLD - abbreviated version of Norton anterior approach, wear chip pack at least 4 hours/day, do not tie too tightly, anatomy and physiology of the lymphatic system Person educated: Patient Education method: Explanation Education comprehension: verbalized understanding  HOME EXERCISE PROGRAM: Reviewed previously given post op HEP. Wear chip pack at least 4 hours a day  ASSESSMENT:  CLINICAL IMPRESSION: Pt returns to PT after completing radiation for head and neck cancer. She has developed lymphedema over the last month and reports that the swelling has been worsening. She would benefit from skilled PT services to decrease anterior neck lymphedema and assist pt in obtaining a compression garment for long term management.   Pt will benefit from skilled therapeutic intervention to improve on the following deficits: Decreased knowledge of precautions, impaired UE functional use, pain, decreased ROM, postural dysfunction, increased edema.  PT treatment/interventions: ADL/Self care home management, Therapeutic exercises, Therapeutic activity, Patient/Family education, Self Care, Joint mobilization, Manual lymph drainage, Compression bandaging, scar mobilization, Taping, Vasopneumatic device, and Manual therapy   GOALS: Goals reviewed with patient? Yes  LONG TERM GOALS:  (STG=LTG)  GOALS Name Target Date  Goal status  1 Pt will demonstrate she has regained full shoulder ROM and function post operatively compared to baselines.  Baseline: 01/06/33 MET  2 Pt will be independent in self MLD for long term management of lymphedema. 02/04/23 INITIAL  3 Pt will obtain a compression garment for long term management of lymphedema. 02/04/23 INITIAL  4  PLAN:  PT FREQUENCY/DURATION: 2x/wk for 4 wks  PLAN FOR NEXT  SESSION: measure R shoulder ROM and compare to L - add goal as needed, begin MLD to anterior neck and issued handout    Leonette Most, PT 01/07/2023, 4:48 PM

## 2023-01-08 ENCOUNTER — Encounter (HOSPITAL_COMMUNITY)
Admission: RE | Admit: 2023-01-08 | Discharge: 2023-01-08 | Disposition: A | Discharge status: Home / Self Care | Payer: Medicare Other | Source: Ambulatory Visit | Attending: Radiation Oncology | Admitting: Radiation Oncology

## 2023-01-08 DIAGNOSIS — C140 Malignant neoplasm of pharynx, unspecified: Secondary | ICD-10-CM | POA: Diagnosis not present

## 2023-01-08 DIAGNOSIS — R918 Other nonspecific abnormal finding of lung field: Secondary | ICD-10-CM | POA: Diagnosis not present

## 2023-01-08 LAB — GLUCOSE, CAPILLARY: Glucose-Capillary: 99 mg/dL (ref 70–99)

## 2023-01-08 MED ORDER — FLUDEOXYGLUCOSE F - 18 (FDG) INJECTION
6.6000 | Freq: Once | INTRAVENOUS | Status: AC | PRN
  Administered 2023-01-08: 6.48 via INTRAVENOUS

## 2023-01-09 ENCOUNTER — Ambulatory Visit: Payer: Medicare Other | Attending: Radiation Oncology | Admitting: Physical Therapy

## 2023-01-09 ENCOUNTER — Encounter: Payer: Self-pay | Admitting: Physical Therapy

## 2023-01-09 DIAGNOSIS — C770 Secondary and unspecified malignant neoplasm of lymph nodes of head, face and neck: Secondary | ICD-10-CM | POA: Insufficient documentation

## 2023-01-09 DIAGNOSIS — I890 Lymphedema, not elsewhere classified: Secondary | ICD-10-CM | POA: Insufficient documentation

## 2023-01-09 DIAGNOSIS — R293 Abnormal posture: Secondary | ICD-10-CM | POA: Diagnosis not present

## 2023-01-09 DIAGNOSIS — L599 Disorder of the skin and subcutaneous tissue related to radiation, unspecified: Secondary | ICD-10-CM | POA: Diagnosis not present

## 2023-01-09 DIAGNOSIS — M25611 Stiffness of right shoulder, not elsewhere classified: Secondary | ICD-10-CM | POA: Diagnosis not present

## 2023-01-09 NOTE — Therapy (Signed)
OUTPATIENT PHYSICAL THERAPY BREAST CANCER POST OP FOLLOW UP   Patient Name: Nichole Dominguez MRN: [id number] DOB:[birth year], 67 y.o., female Today's Date: 01/09/2023  END OF SESSION:  PT End of Session - 01/09/23 1505     Visit Number 3    Number of Visits 10    Date for PT Re-Evaluation 02/04/23    PT Start Time 1504    PT Stop Time 1553    PT Time Calculation (min) 49 min    Activity Tolerance Patient tolerated treatment well    Behavior During Therapy WFL for tasks assessed/performed             Past Medical History:  Diagnosis Date   Allergy    GERD (gastroesophageal reflux disease)    Melanoma (HCC) 12/25/2022   right posterior shoulder ([facility])   Osteopenia 01/04/2020   Plantar fasciitis    Seasonal allergies    Past Surgical History:  Procedure Laterality Date   COLONOSCOPY  2011   IR GASTROSTOMY TUBE MOD SED  08/21/2022   IR IMAGING GUIDED PORT INSERTION  08/21/2022   IR PATIENT EVAL TECH 0-60 MINS  08/31/2022   IR PATIENT EVAL TECH 0-60 MINS  09/07/2022   MOUTH SURGERY  01/2020   implant inserted   TUBAL LIGATION  1992   Patient Active Problem List   Diagnosis Date Noted   Orthostatic syncope 12/26/2022   Melanoma (HCC) 12/25/2022   Pancytopenia, acquired (HCC) 10/12/2022   Cancer associated pain 10/12/2022   Other constipation 10/12/2022   Severe protein-calorie malnutrition (HCC) 10/12/2022   Hypomagnesemia 10/12/2022   Port-A-Cath in place 08/24/2022   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   Lower extremity edema 10/03/2021   Hypertension 08/23/2021   Eczema 12/01/2020   Osteopenia 01/04/2020   Pap smear for cervical cancer screening 10/06/2014   Thyroid nodule 10/06/2014   Routine general medical examination at a health care facility 08/21/2013    PCP: [name]  REFERRING PROVIDER: Lonie Peak, MD  REFERRING DIAG: C77.0 (ICD-10-CM) - Secondary  malignant neoplasm of cervical lymph node (HCC)   THERAPY DIAG:  Lymphedema, not elsewhere classified  Disorder of the skin and subcutaneous tissue related to radiation, unspecified  Stiffness of right shoulder, not elsewhere classified  Abnormal posture  Secondary malignant neoplasm of cervical lymph node (HCC)  Rationale for Evaluation and Treatment: Rehabilitation  ONSET DATE: 07/27/22  SUBJECTIVE:  SUBJECTIVE STATEMENT: I think the swelling has gone down some wearing the chip pack.   PERTINENT HISTORY:  Secondary malignant neoplasm of cervical lymph node, Stage I (T0,N1, M0, p 16 +), staging does not acknowledge the bulky and infiltrative nature of her dominant neck mass. 05/07/22 She saw Dr. Marene Lenz and laryngoscopy was performed which revealed a relatively indurated mass lesion extending from the mid parotid gland to level II in the neck measuring approx. 4 cm. Biopsy of the parotid mass was non diagnostic. 06/26/22 CT neck completed after she began to develop jaw pain and right neck pain. 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 The mass appeared to encase a portion of the right external carotid artery and partially encases the right internal carotid artery Prominent right-sided cervical and upper mediastinal lymph nodes were also appreciated, suspicious for metastatic spread of disease. 07/17/22 She saw Dr. Christoper Allegra who did a Laryngoscopy performed during this visit was notable for mild right pharyngeal fullness, mild right vocal fold hypomobility, and limited anterior movement of the right tongue. Based on CT findings, Dr. Christoper Allegra  recommended proceeding with repeat IR guided biopsy of the mass, as well as staging work-up including an MRI of the neck and PET scan. 07/27/22 MRI of the face showed the infiltrative mass lesion centered in the right carotid, parapharyngeal and parotid spaces with extensive surrounding local invasion, and findings concerning for metastatic adenopathy in the right neck and additional nonspecific mildly enlarged and rounded lymph nodes involving the upper mediastinum and contralateral left jugular chain. MRI also showed evidence of tumor extension within close proximity to multiple skill base foramina, along with evidence of right hypoglossal nerve involvement contributing to right hemitongue denervation changes, and tumor thrombus within the encased right internal jugular vein. MRI otherwise showed no evidence of intracranial perineural tumor spread. 07/27/22 FNA of the right neck mass collected on 07/27/22 showed carcinoma with squamous features, favoring squamous cell carcinoma (however, other tumors with squamous differentiation cannot entirely be excluded); P16 diffusely positive; HPV focally positive. 08/02/22 PET demonstrated  the dominant right level IIa metastatic nodal mass measuring 4.9 x 2.5 cm with an SUV max of 15; multiple ipsilateral nodal metastases involving the right level 3, level 4, and level 5 lymph node stations including the dominant right level 3 nodal metastasis (measuring 1.1 cm) coming in contact with the right oropharynx without discrete primary tumor; FDG avidity within the left palatine tonsil (normal in size and with a max SUV of 4.4); and mildly FDG avid mediastinal, bihilar, and subcarinal lymph nodes. No other sites concerning for metastatic disease were appreciated elsewhere in the body. Given that there is encasement of the carotid, surgical resection was not advised by Dr. Christoper Allegra and chemo/radiation was recommended. She will receive 35 fractions of radiation to her Oropharynx and  bilateral neck with weekly cisplatin. She started radiation on 08/20/22 and will start chemo on 08/24/22 and will complete 10/08/22. 08/21/22 PEG/PAC    PATIENT GOALS:  Reassess how my recovery is going related to arm function, pain, and swelling.  PAIN:  Are you having pain? No  PRECAUTIONS: Head and neck radiation  RED FLAGS: None   ACTIVITY LEVEL / LEISURE: none   OBJECTIVE:   COGNITION: Overall cognitive status: Within functional limits for tasks assessed  POSTURE:  Forward head and rounded shoulders posture   30 SEC SIT TO STAND: 14 reps in 30 sec without use of UEs which is  Average for patient's age   SHOULDER AROM:   Impaired  L shoulder ROM is WFL, R shoulder ROM is limited to about 90 degrees of flexion and approx 45 degrees of abduction, pt reports she thinks it may be from the tumor. She also reports she felt like she tore something in her shoulder and reports she could hear it and it was painful then.      CERVICAL AROM:     Percent limited 01/07/23  Flexion WFL WFL  Extension [facility] WFL  Right lateral flexion [facility] WFL  Left lateral flexion [facility] WFL  Right rotation 30% limited WFL  Left rotation WFL WFL+                          (Blank rows=not tested)    LYMPHEDEMA ASSESSMENT:    Circumference in cm  4 cm superior to sternal notch around neck 36.5  6 cm superior to sternal notch around neck 35.6  8 cm superior to sternal notch around neck 37  R lateral nostril from base of nose to medial tragus   L lateral nostril from base of nose to medial tragus   R corner of mouth to where ear lobe meets face   L corner of mouth to where ear lobe meets face   10 cm superior to sternal notch around neck 38.2     (Blank rows=not tested)  TREATMENT PERFORMED: 01/09/23: Instructed pt using Norton anterior approach handout as follows: short neck, 5 diaphragmatic breaths, bilateral axillary nodes, bilateral pectoral nodes, anterior chest, short neck,  posterior neck moving fluid towards pathway aimed at lateral neck, then lateral and anterior neck moving fluid towards pathway aimed at lateral neck then retracing all steps. Had pt return demonstrate each step and provided v/c for correct sequence and skin stretch as well as t/c for pressure. Therapist completed second half of session.    01/07/23: Created foam chip for pt to wear around head and neck for compression Instructed pt in basic MLD for anterior neck as follows and had her return demonstrate seated in front of mirror: 5 diaphragmatic breaths, bilateral axilla, bilateral pectoral nodes, short neck, anterior neck then retracing steps while providing v/c for correct sequence and skin stretch  PATIENT EDUCATION:  Education details: self MLD - abbreviated version of Norton anterior approach, wear chip pack at least 4 hours/day, do not tie too tightly, anatomy and physiology of the lymphatic system Person educated: Patient Education method: Explanation Education comprehension: verbalized understanding  HOME EXERCISE PROGRAM: Reviewed previously given post op HEP. Wear chip pack at least 4 hours a day  ASSESSMENT:  CLINICAL IMPRESSION: Instructed pt in entire [place] anterior approach technique and had her return demonstrate each step. Pt demonstrated good skin stretch by end of session. She required min v/c for correct pressure. Will continue to progress pt towards independence with self MLD.   Pt will benefit from skilled therapeutic intervention to improve on the following deficits: Decreased knowledge of precautions, impaired UE functional use, pain, decreased ROM, postural dysfunction, increased edema.  PT treatment/interventions: ADL/Self care home management, Therapeutic exercises, Therapeutic activity, Patient/Family education, Self Care, Joint mobilization, Manual lymph drainage, Compression bandaging, scar mobilization, Taping, Vasopneumatic device, and Manual  therapy   GOALS: Goals reviewed with patient? Yes  LONG TERM GOALS:  (  STG=LTG)  GOALS Name Target Date  Goal status  1 Pt will demonstrate she has regained full shoulder ROM and function post operatively compared to baselines.  Baseline: 01/06/33 MET  2 Pt will be independent in self MLD for long term management of lymphedema. 02/04/23 INITIAL  3 Pt will obtain a compression garment for long term management of lymphedema. 02/04/23 INITIAL  4        PLAN:  PT FREQUENCY/DURATION: 2x/wk for 4 wks  PLAN FOR NEXT SESSION: measure R shoulder ROM and compare to L - add goal as needed, begin MLD to anterior neck and issued handout    Leonette Most, PT 01/09/2023, 4:18 PM

## 2023-01-11 ENCOUNTER — Other Ambulatory Visit: Payer: Self-pay

## 2023-01-11 ENCOUNTER — Encounter: Payer: Self-pay | Admitting: Radiation Oncology

## 2023-01-11 ENCOUNTER — Ambulatory Visit
Admission: RE | Admit: 2023-01-11 | Discharge: 2023-01-11 | Disposition: A | Discharge status: Home / Self Care | Payer: Medicare Other | Source: Ambulatory Visit | Attending: Radiation Oncology | Admitting: Radiation Oncology

## 2023-01-11 VITALS — BP 132/70 | HR 90 | Temp 97.3°F | Resp 18 | Ht 61.0 in | Wt 128.1 lb

## 2023-01-11 DIAGNOSIS — C140 Malignant neoplasm of pharynx, unspecified: Secondary | ICD-10-CM

## 2023-01-11 DIAGNOSIS — C801 Malignant (primary) neoplasm, unspecified: Secondary | ICD-10-CM | POA: Diagnosis not present

## 2023-01-11 DIAGNOSIS — Z9221 Personal history of antineoplastic chemotherapy: Secondary | ICD-10-CM | POA: Insufficient documentation

## 2023-01-11 DIAGNOSIS — I700 Atherosclerosis of aorta: Secondary | ICD-10-CM | POA: Diagnosis not present

## 2023-01-11 DIAGNOSIS — Z79899 Other long term (current) drug therapy: Secondary | ICD-10-CM | POA: Diagnosis not present

## 2023-01-11 DIAGNOSIS — C770 Secondary and unspecified malignant neoplasm of lymph nodes of head, face and neck: Secondary | ICD-10-CM

## 2023-01-11 DIAGNOSIS — Z923 Personal history of irradiation: Secondary | ICD-10-CM | POA: Insufficient documentation

## 2023-01-11 NOTE — Progress Notes (Signed)
Oncology Nurse Navigator Documentation   I met with Nichole Dominguez and her husband before and during her follow up with Dr. Basilio Cairo today. She is recovering from her treatment for head and neck cancer. She is not using her PEG but has lost about 6 lbs since she was last weighed here at the cancer center. Nichole Dominguez will call me in 2 weeks to report her weight to me again in hopes of having her PEG removed. She will see Dr. Marene Lenz in November and Dr. Basilio Cairo again in February to receive results of a post treatment CT scan of her chest/neck. She will see Dr. Al Pimple again in one year.   [name] RN, BSN, OCN Head & Neck Oncology Nurse Navigator [facility] Phone # [phone]  Fax # [phone]

## 2023-01-11 NOTE — Progress Notes (Signed)
Radiation Oncology         [phone] ________________________________  Name: Nichole Dominguez MRN: [id number]  Date: 01/11/2023  DOB: [birth year]  Follow-Up Visit Note  CC: Nichole Craze, NP  Nichole Craze, NP  Diagnosis and Prior Radiotherapy:       ICD-10-CM   1. 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 Secondary malignant neoplasm of cervical lymph node (HCC)  C77.0      Cancer Staging  Secondary malignant neoplasm of cervical lymph node (HCC) Staging form: Pharynx - HPV-Mediated Oropharynx, AJCC 8th Edition - Clinical stage from 08/10/2022: Stage I (cT0, cN1, cM0, p16+) - Signed by Nichole Peak, MD on 08/11/2022 Stage prefix: Initial diagnosis   First Treatment Date: 2022-08-20 - Last Treatment Date: 2022-10-08   Plan Name: HN_Pharynx Site: Pharynx Technique: IMRT Mode: Photon Dose Per Fraction: 2 Gy Prescribed Dose (Delivered / Prescribed): 70 Gy / 70 Gy Prescribed Fxs (Delivered / Prescribed): 35 / 35   CHIEF COMPLAINT:  Here for follow-up and surveillance of neck cancer  Narrative:  Nichole Dominguez presents today for follow up for cancer of her right parapharyngeal space. She completed treatment on 10-08-22. Pt will have Pet scan on 01-08-23.   Pain issues, if any: no pain at this time Using a feeding tube?: still has feeding tube, just flushing now, still doing boost and ensures, still working getting weight stable Weight changes, if any:  Wt Readings from Last 3 Encounters:  01/11/23 128 lb 2 oz (58.1 kg)  01/01/23 133 lb 3.2 oz (60.4 kg)  12/25/22 134 lb (60.8 kg)   Swallowing issues, if any: no swallowing concerns Smoking or chewing tobacco? No  Using fluoride toothpaste daily? no Last ENT visit was on: has not been since diagnosis, going to Dr. Irene Limbo in November Other notable issues, if any: Pt really wants feeding tube out. No other major concerns or questions.   Vitals:   01/11/23 0955  BP: 132/70  Pulse: 90  Resp: 18  Temp: (!)  97.3 F (36.3 C)  SpO2: 99%     ALLERGIES:  is allergic to amlodipine, darvon [propoxyphene], and sudafed [pseudoephedrine hcl].  Meds: Current Outpatient Medications  Medication Sig Dispense Refill   acetaminophen (TYLENOL) 500 MG tablet Take 1,500 mg by mouth every 6 (six) hours as needed.     Calcium Carbonate (CALCIUM 600 HIGH POTENCY PO) Take 3 capsules by mouth daily.     cholecalciferol (VITAMIN D3) 25 MCG (1000 UNIT) tablet Take 1,000 Units by mouth daily.     fluticasone (FLONASE) 50 MCG/ACT nasal spray Place 2 sprays into both nostrils daily. 48 g 3   Magnesium 500 MG CAPS Take 1 capsule (500 mg total) by mouth daily.     Multiple Vitamins-Minerals (MULTIVITAMIN WITH MINERALS) tablet Take 1 tablet by mouth daily.     omeprazole (PRILOSEC) 20 MG capsule Take 1 capsule (20 mg total) by mouth every morning. 90 capsule 1   augmented betamethasone dipropionate (DIPROLENE-AF) 0.05 % ointment Apply topically 2 (two) times daily. (Patient not taking: Reported on 01/07/2023) 15 g 0   No current facility-administered medications for this encounter.    Physical Findings: The patient is in no acute distress. Patient is alert and oriented. Wt Readings from Last 3 Encounters:  01/11/23 128 lb 2 oz (58.1 kg)  01/01/23 133 lb 3.2 oz (60.4 kg)  12/25/22 134 lb (60.8 kg)    height is 5\' 1"  (1.549 m) and weight is 128 lb 2  oz (58.1 kg). Her temporal temperature is 97.3 F (36.3 C) (abnormal). Her blood pressure is 132/70 and her pulse is 90. Her respiration is 18 and oxygen saturation is 99%. .  General: Alert and oriented, in no acute distress HEENT: Head is normocephalic. Extraocular movements are intact. Oropharynx is notable for no mucositis, tumor, or thrush; tongue deviates slightly to the right Neck: Neck is notable for subtle fullness in upper right neck  Skin: Skin in treatment fields shows  dry skin over neck Lymphatics: see Neck Exam Psychiatric: Judgment and insight are intact.  Affect is appropriate. Abd: PEG tube present  Ext: No edema in extremities  Lab Findings: Lab Results  Component Value Date   WBC 6.5 12/25/2022   HGB 11.7 (L) 12/25/2022   HCT 34.7 (L) 12/25/2022   MCV 90.0 12/25/2022   PLT 346.0 12/25/2022    Lab Results  Component Value Date   TSH 3.02 12/19/2022    Radiographic Findings: NM PET Image Restag (PS) Skull Base To Thigh  Result Date: 01/08/2023 CLINICAL DATA:  Subsequent treatment strategy for pharyngeal cancer, status post chemotherapy and XRT. EXAM: NUCLEAR MEDICINE PET SKULL BASE TO THIGH TECHNIQUE: 6.5 mCi F-18 FDG was injected intravenously. Full-ring PET imaging was performed from the skull base to thigh after the radiotracer. CT data was obtained and used for attenuation correction and anatomic localization. Fasting blood glucose: 99 mg/dl COMPARISON:  Outside hospital [facility]) PET-CT dated 08/02/2022 FINDINGS: Mediastinal blood pool activity: SUV max 3.1 Liver activity: SUV max NA NECK: Mild residual soft tissue fullness in the right level 2 neck, max SUV 3.4, indeterminate. Otherwise, no suspicious pharyngeal hypermetabolism or cervical lymphadenopathy. Incidental CT findings: None. CHEST: Patchy/nodular opacities at the lung apices (series 7/image 12), favoring radiation changes. Associated hypermetabolism in the anterior left lung apex, max SUV 5.0. 1.2 x 2.2 cm nodular opacity at the right lung base (series 7/image 83), new from the prior, max SUV 3.0. No hypermetabolic thoracic lymphadenopathy. Right chest port terminates in the lower SVC. Incidental CT findings: Atherosclerotic calcifications of the aortic arch. ABDOMEN/PELVIS: No abnormal hypermetabolism in the liver, spleen, pancreas, or adrenal glands. Gastrostomy in satisfactory position in the gastric antrum. No hypermetabolic abdominopelvic lymphadenopathy. Incidental CT findings: Cholelithiasis, without associated inflammatory changes. Sigmoid diverticulosis,  without evidence of diverticulitis. Atherosclerotic calcifications of the abdominal aorta and branch vessels. SKELETON: No focal hypermetabolic activity to suggest skeletal metastasis. Incidental CT findings: None. IMPRESSION: Mild residual soft tissue fullness in the right level 2 neck, indeterminate. Otherwise, no findings specific for metastatic disease. Suspected radiation changes in the lung apices. Given associated hypermetabolism, follow-up CT chest is suggested in 3 months. 2.2 cm nodular opacity at the right lung base, new from the prior. This appearance is not typical for metastasis, but warrants attention on follow-up. Electronically Signed   By: Charline Bills M.D.   On: 01/08/2023 16:03    Impression/Plan:    1) Head and Neck Cancer Status: She is in remission according to PET restaging scans and physical exam  2) Nutritional Status:  Wt Readings from Last 3 Encounters:  01/11/23 128 lb 2 oz (58.1 kg)  01/01/23 133 lb 3.2 oz (60.4 kg)  12/25/22 134 lb (60.8 kg)   She would like to get her feeding tube removed but her weight has been trending downward.  She will call our head and neck navigator in the next couple weeks to update her on how she is doing with maintaining her weight.  She has  not used her feeding tube for the past month.  3) Risk Factors: The patient has been educated about risk factors including alcohol and tobacco abuse; they understand that avoidance of alcohol and tobacco is important to prevent recurrences as well as other cancers  4) Swallowing: Continue following with speech-language pathology as indicated  5) Dental: Encouraged to continue regular followup with dentistry, and dental hygiene including fluoride rinses.  She is using prescription strength fluoride toothpaste and sees a dentist early next year.  6) Thyroid function: Check at next visit Lab Results  Component Value Date   TSH 3.02 12/19/2022    7) Other: Apply moisturizer to neck as the skin  on her neck is still quite dry.  She will see me back in 6 months with CT of neck and chest with contrast.   On date of service, in total, I spent 35 minutes on this encounter. Patient was seen in person. _____________________________________   Nichole Peak, MD

## 2023-01-16 ENCOUNTER — Ambulatory Visit: Payer: Medicare Other | Attending: Radiation Oncology

## 2023-01-16 DIAGNOSIS — R1310 Dysphagia, unspecified: Secondary | ICD-10-CM | POA: Diagnosis not present

## 2023-01-16 NOTE — Therapy (Signed)
OUTPATIENT SPEECH LANGUAGE PATHOLOGY ONCOLOGY TREATMENT   Patient Name: Nichole Dominguez MRN: [id number] DOB:[birth year], 67 y.o., female Today's Date: 01/16/2023  PCP: [name], MD REFERRING PROVIDER: Lonie Peak, MD  END OF SESSION:  End of Session - 01/16/23 1323     Visit Number 3    Number of Visits 7    Date for SLP Re-Evaluation 02/06/23    SLP Start Time 1318    SLP Stop Time  1346    SLP Time Calculation (min) 28 min    Activity Tolerance Patient tolerated treatment well              Past Medical History:  Diagnosis Date   Allergy    GERD (gastroesophageal reflux disease)    Melanoma (HCC) 12/25/2022   right posterior shoulder ([facility])   Osteopenia 01/04/2020   Plantar fasciitis    Seasonal allergies    Syncope and collapse 12/21/2022   Past Surgical History:  Procedure Laterality Date   COLONOSCOPY  2011   IR GASTROSTOMY TUBE MOD SED  08/21/2022   IR IMAGING GUIDED PORT INSERTION  08/21/2022   IR PATIENT EVAL TECH 0-60 MINS  08/31/2022   IR PATIENT EVAL TECH 0-60 MINS  09/07/2022   MOUTH SURGERY  01/2020   implant inserted   TUBAL LIGATION  1992   Patient Active Problem List   Diagnosis Date Noted   Orthostatic syncope 12/26/2022   Melanoma (HCC) 12/25/2022   Pancytopenia, acquired (HCC) 10/12/2022   Cancer associated pain 10/12/2022   Other constipation 10/12/2022   Severe protein-calorie malnutrition (HCC) 10/12/2022   Hypomagnesemia 10/12/2022   Port-A-Cath in place 08/24/2022   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   Lower extremity edema 10/03/2021   Hypertension 08/23/2021   Eczema 12/01/2020   Osteopenia 01/04/2020   Pap smear for cervical cancer screening 10/06/2014   Thyroid nodule 10/06/2014   Routine general medical examination at a health care facility 08/21/2013    ONSET DATE: See "pertinent hx" below    REFERRING DIAG: Secondary  malignant neoplasm of cervical lymph node    THERAPY DIAG:  Dysphagia, unspecified type   Rationale for Evaluation and Treatment: Rehabilitation   SUBJECTIVE:    SUBJECTIVE STATEMENT: "The bread - I don't know what it is. It just balls up. And everything still tastes (bad)." Pt accompanied by: family member   PERTINENT HISTORY:  05/07/22 She saw Dr. Marene Lenz and laryngoscopy was performed which revealed a relatively indurated mass lesion extending from the mid parotid gland to level II in the neck measuring approx. 4 cm. Biopsy of the parotid mass was non diagnostic. 06/26/22 CT neck completed after she began to develop jaw pain and right neck pain. 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 The mass appeared to encase a portion of the right external carotid artery and partially encases the right internal carotid artery Prominent right-sided cervical and upper mediastinal lymph nodes were also appreciated, suspicious for metastatic spread of  disease. 07/17/22 She saw Dr. Christoper Allegra who did a Laryngoscopy performed during this visit was notable for mild right pharyngeal fullness, mild right vocal fold hypomobility, and limited anterior movement of the right tongue. Based on CT findings, Dr. Christoper Allegra recommended proceeding with repeat IR guided biopsy of the  mass, as well as staging work-up including an MRI of the neck and PET scan. 07/27/22 MRI of the face showed the infiltrative mass lesion centered in the right carotid, parapharyngeal and parotid spaces with extensive surrounding local invasion, and findings concerning for metastatic adenopathy in the right neck and additional nonspecific mildly enlarged and rounded lymph nodes involving the upper mediastinum and contralateral left jugular chain. MRI also showed evidence of tumor  extension within close proximity to multiple skill base foramina, along with evidence of right hypoglossal nerve involvement contributing to right hemitongue denervation changes, and tumor thrombus within the encased right internal jugular vein. MRI otherwise showed no evidence of intracranial perineural tumor spread. 07/27/22 FNA of the right neck mass collected on 07/27/22 showed carcinoma with squamous features, favoring squamous cell carcinoma (however, other tumors with squamous differentiation cannot entirely be excluded); P16 diffusely positive; HPV focally positive. 08/02/22 PET demonstrated  the dominant right level IIa metastatic nodal mass measuring 4.9 x 2.5 cm with an SUV max of 15; multiple ipsilateral nodal metastases involving the right level 3, level 4, and level 5 lymph node stations including the dominant right level 3 nodal metastasis (measuring 1.1 cm) coming in contact with the right oropharynx without discrete primary tumor; FDG avidity within the left palatine tonsil (normal in size and with a max SUV of 4.4); and mildly FDG avid mediastinal, bihilar, and subcarinal lymph nodes. No other sites concerning for metastatic disease were appreciated elsewhere in the body. Given that there is encasement of the carotid, surgical resection was not advised by Dr. Christoper Allegra and  chemo/radiation was recommended.08/10/22 Consult with Dr. Basilio Cairo & 08/17/22 Consult with Dr. Al Pimple. She will receive chemo/radiation. Treatment plan:  She will receive 35 fractions of radiation to her Oropharynx and bilateral neck with weekly cisplatin. She started radiation on 08/20/22 and will start chemo on 08/24/22 and will complete 10/08/22. Pretreatment procedures. 08/21/22 PEG/PAC   PAIN:  Are you having pain? No    FALLS: Has patient fallen in last 6 months?  No   LIVING ENVIRONMENT: Lives with: lives with their spouse Lives in: House/apartment   PLOF:  Level of assistance: Independent with ADLs, Independent with  IADLs Employment: Retired   PATIENT GOALS: Maintain WNL swallowing   OBJECTIVE:        TODAY'S TREATMENT:                                                                                                                                         DATE:  01/16/23: Egg noodles with cream of mushroom soup, and a cup of peas prior to coming today. She is still having trouble with dysgeusia/ageusia. She ate bites of peanut butter crackers and drank water without any overt s/sx pharyngeal or s/sx oral stage dysphagia.  With HEP, pt was independent. She is performing at least 5 days/week. SLP told pt no less than this to  minimize risk of muscle fibrosis. She and SLP agreed pt could reduce to once every 8 weeks due to success with HEP and safety with POs.  12/18/22: SLP identified what looks to be lymphedema and told referring MD. She will send referral for PT.  Pt is having 2 eggs daily, and has one more "meal" daily - baked beans and other softer foods. Still has dysgeusia/ageusia. SLP told pt to focus on those foods she can tolerate. She still has PEG but is losing weight. SLP encouraged her to reach out to one of the oncology dieticians about this.SLP provided pt a packet of high calorie high protein liquids/shake recipes.  SLP had to provide rare min A for HEP today. Pt is not completing scope as directed (doing effortful and masako but not shaker, Mendlsohn, and supraglottic). SLP reiterated to pt to complete ENTIRE HEP.  No overt s/sx pharyngeal deficits; Pt had extended mastication time very likely due to xerostomia. SLP provided handout and explanation of aspiration PNA. Pt demonstrated understanding. Decr to once every other month if pt's HEP procedure is WNL and she exhibits WNL swallowing safety.  Research states the risk for dysphagia increases due to radiation and/or chemotherapy treatment due to a variety of factors, so SLP re-educated pt regarding possible changes to swallowing musculature after  rad tx, and why adherence to dysphagia HEP provided today and PO consumption was necessary to inhibit muscle fibrosis following rad tx and to mitigate muscle disuse atrophy. SLP informed pt why this would be detrimental to their swallowing status and to their pulmonary health. Pt demonstrated understanding of these things to SLP. SLP encouraged pt to safely eat and drink as deep into their radiation/chemotherapy as possible to provide the best possible long-term swallowing outcome for pt.   SLP then reviewed the individualized HEP for pt ithat was developed in June involving oral and pharyngeal strengthening and ROM and pt was instructed how to perform these exercises, including SLP demonstration. After SLP demonstration, pt return demonstrated each exercise. SLP ensured pt performance was correct prior to educating pt on next exercise. Pt required usual min-mod cues faded to modified independent to perform HEP. Pt was instructed to complete this program 6-7 days/week, at least 20 reps/day with performing no less than 5 reps at one time. Shaker x3 reps BID, until 6 months after the last day of rad tx, and then x2 a week after that, indefinitely.     PATIENT EDUCATION: Education details: see "today's treatment" for details Person educated: Patient  Education method: Explanation, Demonstration, Verbal cues, and Handouts Education comprehension: verbalized understanding, returned demonstration, verbal cues required, and needs further education     ASSESSMENT:   CLINICAL IMPRESSION: Patient is a 67 y.o. F who was seen today for treatment of swallowing after completing radiation/chemoradiation therapy. POs: At this time pt swallowing is deemed WNL/WFL with most regular diet items and thin liquids. No oral  deficits were observed today. Pt coughed once with water. There are no overt s/s aspiration PNA observed by SLP nor any reported by pt at this time. Data indicate that pt's swallow ability could very well  decline over time following the conclusion of chemorad therapy due to muscle disuse atrophy and/or muscle fibrosis. Pt will cont to need to be seen by SLP in order to assess safety of PO intake, assess the need for recommending any objective swallow assessment, and ensuring pt is correctly completing the individualized HEP. Pt will be seen every 8 weeks due to POs  safe, and HEP independent.    OBJECTIVE IMPAIRMENTS: include dysphagia. These impairments are limiting patient from safety when swallowing. Factors affecting potential to achieve goals and functional outcome are  none noted today. Patient will benefit from skilled SLP services to address above impairments and improve overall function.   REHAB POTENTIAL: Good   GOALS: Goals reviewed with patient? No   SHORT TERM GOALS: Target: 3rd total session     1. Pt will complete HEP with modified independence in 2 sessions Baseline: 01/16/23 Goal status: partially met     2.  pt will tell SLP why pt is completing HEP with modified independence Baseline:  Goal status: met     3.  pt ill describe 3 overt s/s aspiration PNA with modified independence Baseline:  Goal status: met     4.  pt will tell SLP how a food journal may help return to a more normalized             diet Baseline:  Goal status: deferred to LTG     LONG TERM GOALS: Target: 7th total session   1.  pt will complete HEP with independence over two visits Baseline: 01/16/23 Goal status: Initial   2.  pt will describe how to modify HEP over time, and the timeline associated with reduction in HEP frequency with modified independence over two sessions Baseline:  Goal status: Initial  3.  pt will tell SLP how a food journal may help return to a more normalized             diet Baseline:  Goal status: Initial (from STGs)    PLAN:   SLP FREQUENCY:  once approx every 8 weeks   SLP DURATION:  6 sessions (7 total sessions)   PLANNED INTERVENTIONS: Aspiration  precaution training, Pharyngeal strengthening exercises, Diet toleration management , Environmental controls, Trials of upgraded texture/liquids, Cueing hierachy, Internal/external aids, SLP instruction and feedback, Compensatory strategies, and Patient/family education   [facility], CCC-SLP 01/16/2023, 2:01 PM

## 2023-01-17 ENCOUNTER — Ambulatory Visit: Payer: Medicare Other | Admitting: Physical Therapy

## 2023-01-17 ENCOUNTER — Encounter: Payer: Self-pay | Admitting: Physical Therapy

## 2023-01-17 DIAGNOSIS — L599 Disorder of the skin and subcutaneous tissue related to radiation, unspecified: Secondary | ICD-10-CM

## 2023-01-17 DIAGNOSIS — M25611 Stiffness of right shoulder, not elsewhere classified: Secondary | ICD-10-CM | POA: Diagnosis not present

## 2023-01-17 DIAGNOSIS — R293 Abnormal posture: Secondary | ICD-10-CM

## 2023-01-17 DIAGNOSIS — I890 Lymphedema, not elsewhere classified: Secondary | ICD-10-CM | POA: Diagnosis not present

## 2023-01-17 DIAGNOSIS — C770 Secondary and unspecified malignant neoplasm of lymph nodes of head, face and neck: Secondary | ICD-10-CM

## 2023-01-17 NOTE — Therapy (Signed)
OUTPATIENT PHYSICAL THERAPY BREAST CANCER POST OP FOLLOW UP   Patient Name: Kenzley Coughlin MRN: [id number] DOB:October 28, 1955, 67 y.o., female Today's Date: 01/17/2023  END OF SESSION:  PT End of Session - 01/17/23 0851     Visit Number 4    Number of Visits 10    Date for PT Re-Evaluation 02/04/23    PT Start Time 0803    PT Stop Time 0849    PT Time Calculation (min) 46 min    Activity Tolerance Patient tolerated treatment well    Behavior During Therapy [facility] for tasks assessed/performed              Past Medical History:  Diagnosis Date   Allergy    GERD (gastroesophageal reflux disease)    Melanoma (HCC) 12/25/2022   right posterior shoulder ([facility])   Osteopenia 01/04/2020   Plantar fasciitis    Seasonal allergies    Syncope and collapse 12/21/2022   Past Surgical History:  Procedure Laterality Date   COLONOSCOPY  2011   IR GASTROSTOMY TUBE MOD SED  08/21/2022   IR IMAGING GUIDED PORT INSERTION  08/21/2022   IR PATIENT EVAL TECH 0-60 MINS  08/31/2022   IR PATIENT EVAL TECH 0-60 MINS  09/07/2022   MOUTH SURGERY  01/2020   implant inserted   TUBAL LIGATION  1992   Patient Active Problem List   Diagnosis Date Noted   Orthostatic syncope 12/26/2022   Melanoma (HCC) 12/25/2022   Pancytopenia, acquired (HCC) 10/12/2022   Cancer associated pain 10/12/2022   Other constipation 10/12/2022   Severe protein-calorie malnutrition (HCC) 10/12/2022   Hypomagnesemia 10/12/2022   Port-A-Cath in place 08/24/2022   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   Lower extremity edema 10/03/2021   Hypertension 08/23/2021   Eczema 12/01/2020   Osteopenia 01/04/2020   Pap smear for cervical cancer screening 10/06/2014   Thyroid nodule 10/06/2014   Routine general medical examination at a health care facility 08/21/2013    PCP: [name]  REFERRING PROVIDER: Lonie Peak, MD  REFERRING  DIAG: C77.0 (ICD-10-CM) - Secondary malignant neoplasm of cervical lymph node (HCC)   THERAPY DIAG:  Lymphedema, not elsewhere classified  Disorder of the skin and subcutaneous tissue related to radiation, unspecified  Stiffness of right shoulder, not elsewhere classified  Abnormal posture  Secondary malignant neoplasm of cervical lymph node (HCC)  Rationale for Evaluation and Treatment: Rehabilitation  ONSET DATE: 07/27/22  SUBJECTIVE:  SUBJECTIVE STATEMENT: I have been practicing the self massage once a day.   PERTINENT HISTORY:  Secondary malignant neoplasm of cervical lymph node, Stage I (T0,N1, M0, p 16 +), staging does not acknowledge the bulky and infiltrative nature of her dominant neck mass. 05/07/22 She saw Dr. Marene Lenz and laryngoscopy was performed which revealed a relatively indurated mass lesion extending from the mid parotid gland to level II in the neck measuring approx. 4 cm. Biopsy of the parotid mass was non diagnostic. 06/26/22 CT neck completed after she began to develop jaw pain and right neck pain. 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 The mass appeared to encase a portion of the right external carotid artery and partially encases the right internal carotid artery Prominent right-sided cervical and upper mediastinal lymph nodes were also appreciated, suspicious for metastatic spread of disease. 07/17/22 She saw Dr. Christoper Allegra who did a Laryngoscopy performed during this visit was notable for mild right pharyngeal fullness, mild right vocal fold hypomobility, and limited anterior movement of the right tongue. Based on  CT findings, Dr. Christoper Allegra recommended proceeding with repeat IR guided biopsy of the mass, as well as staging work-up including an MRI of the neck and PET scan. 07/27/22 MRI of the face showed the infiltrative mass lesion centered in the right carotid, parapharyngeal and parotid spaces with extensive surrounding local invasion, and findings concerning for metastatic adenopathy in the right neck and additional nonspecific mildly enlarged and rounded lymph nodes involving the upper mediastinum and contralateral left jugular chain. MRI also showed evidence of tumor extension within close proximity to multiple skill base foramina, along with evidence of right hypoglossal nerve involvement contributing to right hemitongue denervation changes, and tumor thrombus within the encased right internal jugular vein. MRI otherwise showed no evidence of intracranial perineural tumor spread. 07/27/22 FNA of the right neck mass collected on 07/27/22 showed carcinoma with squamous features, favoring squamous cell carcinoma (however, other tumors with squamous differentiation cannot entirely be excluded); P16 diffusely positive; HPV focally positive. 08/02/22 PET demonstrated  the dominant right level IIa metastatic nodal mass measuring 4.9 x 2.5 cm with an SUV max of 15; multiple ipsilateral nodal metastases involving the right level 3, level 4, and level 5 lymph node stations including the dominant right level 3 nodal metastasis (measuring 1.1 cm) coming in contact with the right oropharynx without discrete primary tumor; FDG avidity within the left palatine tonsil (normal in size and with a max SUV of 4.4); and mildly FDG avid mediastinal, bihilar, and subcarinal lymph nodes. No other sites concerning for metastatic disease were appreciated elsewhere in the body. Given that there is encasement of the carotid, surgical resection was not advised by Dr. Christoper Allegra and chemo/radiation was recommended. She will receive 35 fractions of radiation to  her Oropharynx and bilateral neck with weekly cisplatin. She started radiation on 08/20/22 and will start chemo on 08/24/22 and will complete 10/08/22. 08/21/22 PEG/PAC    PATIENT GOALS:  Reassess how my recovery is going related to arm function, pain, and swelling.  PAIN:  Are you having pain? No  PRECAUTIONS: Head and neck radiation  RED FLAGS: None   ACTIVITY LEVEL / LEISURE: none   OBJECTIVE:   COGNITION: Overall cognitive status: Within functional limits for tasks assessed  POSTURE:  Forward head and rounded shoulders posture   30 SEC SIT TO STAND: 14 reps in 30 sec without use of UEs which is  Average for patient's age   SHOULDER AROM:   Impaired  L shoulder ROM is WFL, R shoulder ROM is limited to about 90 degrees of flexion and approx 45 degrees of abduction, pt reports she thinks it may be from the tumor. She also reports she felt like she tore something in her shoulder and reports she could hear it and it was painful then.      CERVICAL AROM:     Percent limited 01/07/23  Flexion WFL WFL  Extension [facility] WFL  Right lateral flexion [facility] WFL  Left lateral flexion [facility] WFL  Right rotation 30% limited WFL  Left rotation WFL WFL+                          (Blank rows=not tested)    LYMPHEDEMA ASSESSMENT:    Circumference in cm  4 cm superior to sternal notch around neck 36.5  6 cm superior to sternal notch around neck 35.6  8 cm superior to sternal notch around neck 37  R lateral nostril from base of nose to medial tragus   L lateral nostril from base of nose to medial tragus   R corner of mouth to where ear lobe meets face   L corner of mouth to where ear lobe meets face   10 cm superior to sternal notch around neck 38.2     (Blank rows=not tested)  TREATMENT PERFORMED: 01/17/23: Had pt demonstrate entire sequence following Norton anterior approach handout in sitting as follows: short neck, 5 diaphragmatic breaths, bilateral axillary nodes,  bilateral pectoral nodes, anterior chest, short neck, posterior neck moving fluid towards pathway aimed at lateral neck, then lateral and anterior neck moving fluid towards pathway aimed at lateral neck then therapist performed second half of session in supine then retracing all steps. Provided v/c for correct pressure and skin stretch. Pt demonstrated independence with sequence while following handout.   01/09/23: Instructed pt using Norton anterior approach handout as follows: short neck, 5 diaphragmatic breaths, bilateral axillary nodes, bilateral pectoral nodes, anterior chest, short neck, posterior neck moving fluid towards pathway aimed at lateral neck, then lateral and anterior neck moving fluid towards pathway aimed at lateral neck then retracing all steps. Had pt return demonstrate each step and provided v/c for correct sequence and skin stretch as well as t/c for pressure. Therapist completed second half of session.    01/07/23: Created foam chip for pt to wear around head and neck for compression Instructed pt in basic MLD for anterior neck as follows and had her return demonstrate seated in front of mirror: 5 diaphragmatic breaths, bilateral axilla, bilateral pectoral nodes, short neck, anterior neck then retracing steps while providing v/c for correct sequence and skin stretch  PATIENT EDUCATION:  Education details: self MLD - abbreviated version of Norton anterior approach, wear chip pack at least 4 hours/day, do not tie too tightly, anatomy and physiology of the lymphatic system Person educated: Patient Education method: Explanation Education comprehension: verbalized understanding  HOME EXERCISE PROGRAM: Reviewed previously given post op HEP. Wear chip pack at least 4 hours a day  ASSESSMENT:  CLINICAL IMPRESSION: Continued instructing pt in self MLD technique. Pt was independent with sequence following Norton anterior approach handout. She did require cueing for pressure and  correct skin stretch. Her neck was  visibly reduced by end of session.   Pt will benefit from skilled therapeutic intervention to improve on the following deficits: Decreased knowledge of precautions, impaired UE functional use, pain, decreased ROM, postural dysfunction, increased edema.  PT treatment/interventions: ADL/Self care home management, Therapeutic exercises, Therapeutic activity, Patient/Family education, Self Care, Joint mobilization, Manual lymph drainage, Compression bandaging, scar mobilization, Taping, Vasopneumatic device, and Manual therapy   GOALS: Goals reviewed with patient? Yes  LONG TERM GOALS:  (STG=LTG)  GOALS Name Target Date  Goal status  1 Pt will demonstrate she has regained full shoulder ROM and function post operatively compared to baselines.  Baseline: 01/06/33 MET  2 Pt will be independent in self MLD for long term management of lymphedema. 02/04/23 INITIAL  3 Pt will obtain a compression garment for long term management of lymphedema. 02/04/23 INITIAL  4        PLAN:  PT FREQUENCY/DURATION: 2x/wk for 4 wks  PLAN FOR NEXT SESSION: measure R shoulder ROM and compare to L - add goal as needed, begin MLD to anterior neck and issued handout    Leonette Most, PT 01/17/2023, 8:58 AM

## 2023-01-22 ENCOUNTER — Ambulatory Visit: Payer: Medicare Other | Admitting: Physical Therapy

## 2023-01-22 DIAGNOSIS — C770 Secondary and unspecified malignant neoplasm of lymph nodes of head, face and neck: Secondary | ICD-10-CM | POA: Diagnosis not present

## 2023-01-22 DIAGNOSIS — M25611 Stiffness of right shoulder, not elsewhere classified: Secondary | ICD-10-CM | POA: Diagnosis not present

## 2023-01-22 DIAGNOSIS — R293 Abnormal posture: Secondary | ICD-10-CM | POA: Diagnosis not present

## 2023-01-22 DIAGNOSIS — I890 Lymphedema, not elsewhere classified: Secondary | ICD-10-CM

## 2023-01-22 DIAGNOSIS — L599 Disorder of the skin and subcutaneous tissue related to radiation, unspecified: Secondary | ICD-10-CM | POA: Diagnosis not present

## 2023-01-22 NOTE — Therapy (Signed)
OUTPATIENT PHYSICAL THERAPY BREAST CANCER POST OP FOLLOW UP   Patient Name: Markeya Boag MRN: [id number] DOB:May 08, 1955, 67 y.o., female Today's Date: 01/22/2023  END OF SESSION:  PT End of Session - 01/22/23 1203     Visit Number 5    Number of Visits 10    Date for PT Re-Evaluation 02/04/23    PT Start Time 1202    PT Stop Time 1245    PT Time Calculation (min) 43 min    Activity Tolerance Patient tolerated treatment well    Behavior During Therapy WFL for tasks assessed/performed              Past Medical History:  Diagnosis Date   Allergy    GERD (gastroesophageal reflux disease)    Melanoma (HCC) 12/25/2022   right posterior shoulder ([facility])   Osteopenia 01/04/2020   Plantar fasciitis    Seasonal allergies    Syncope and collapse 12/21/2022   Past Surgical History:  Procedure Laterality Date   COLONOSCOPY  2011   IR GASTROSTOMY TUBE MOD SED  08/21/2022   IR IMAGING GUIDED PORT INSERTION  08/21/2022   IR PATIENT EVAL TECH 0-60 MINS  08/31/2022   IR PATIENT EVAL TECH 0-60 MINS  09/07/2022   MOUTH SURGERY  01/2020   implant inserted   TUBAL LIGATION  1992   Patient Active Problem List   Diagnosis Date Noted   Orthostatic syncope 12/26/2022   Melanoma (HCC) 12/25/2022   Pancytopenia, acquired (HCC) 10/12/2022   Cancer associated pain 10/12/2022   Other constipation 10/12/2022   Severe protein-calorie malnutrition (HCC) 10/12/2022   Hypomagnesemia 10/12/2022   Port-A-Cath in place 08/24/2022   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   Lower extremity edema 10/03/2021   Hypertension 08/23/2021   Eczema 12/01/2020   Osteopenia 01/04/2020   Pap smear for cervical cancer screening 10/06/2014   Thyroid nodule 10/06/2014   Routine general medical examination at a health care facility 08/21/2013    PCP: [name]  REFERRING PROVIDER: Lonie Peak, MD  REFERRING  DIAG: C77.0 (ICD-10-CM) - Secondary malignant neoplasm of cervical lymph node (HCC)   THERAPY DIAG:  Lymphedema, not elsewhere classified  Disorder of the skin and subcutaneous tissue related to radiation, unspecified  Stiffness of right shoulder, not elsewhere classified  Abnormal posture  Secondary malignant neoplasm of cervical lymph node (HCC)  Rationale for Evaluation and Treatment: Rehabilitation  ONSET DATE: 07/27/22  SUBJECTIVE:  SUBJECTIVE STATEMENT: I was a little slack with the massage because I was at the beach this weekend.   PERTINENT HISTORY:  Secondary malignant neoplasm of cervical lymph node, Stage I (T0,N1, M0, p 16 +), staging does not acknowledge the bulky and infiltrative nature of her dominant neck mass. 05/07/22 She saw Dr. Marene Lenz and laryngoscopy was performed which revealed a relatively indurated mass lesion extending from the mid parotid gland to level II in the neck measuring approx. 4 cm. Biopsy of the parotid mass was non diagnostic. 06/26/22 CT neck completed after she began to develop jaw pain and right neck pain. 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 The mass appeared to encase a portion of the right external carotid artery and partially encases the right internal carotid artery Prominent right-sided cervical and upper mediastinal lymph nodes were also appreciated, suspicious for metastatic spread of disease. 07/17/22 She saw Dr. Christoper Allegra who did a Laryngoscopy performed during this visit was notable for mild right pharyngeal fullness, mild right vocal fold hypomobility, and limited anterior movement of  the right tongue. Based on CT findings, Dr. Christoper Allegra recommended proceeding with repeat IR guided biopsy of the mass, as well as staging work-up including an MRI of the neck and PET scan. 07/27/22 MRI of the face showed the infiltrative mass lesion centered in the right carotid, parapharyngeal and parotid spaces with extensive surrounding local invasion, and findings concerning for metastatic adenopathy in the right neck and additional nonspecific mildly enlarged and rounded lymph nodes involving the upper mediastinum and contralateral left jugular chain. MRI also showed evidence of tumor extension within close proximity to multiple skill base foramina, along with evidence of right hypoglossal nerve involvement contributing to right hemitongue denervation changes, and tumor thrombus within the encased right internal jugular vein. MRI otherwise showed no evidence of intracranial perineural tumor spread. 07/27/22 FNA of the right neck mass collected on 07/27/22 showed carcinoma with squamous features, favoring squamous cell carcinoma (however, other tumors with squamous differentiation cannot entirely be excluded); P16 diffusely positive; HPV focally positive. 08/02/22 PET demonstrated  the dominant right level IIa metastatic nodal mass measuring 4.9 x 2.5 cm with an SUV max of 15; multiple ipsilateral nodal metastases involving the right level 3, level 4, and level 5 lymph node stations including the dominant right level 3 nodal metastasis (measuring 1.1 cm) coming in contact with the right oropharynx without discrete primary tumor; FDG avidity within the left palatine tonsil (normal in size and with a max SUV of 4.4); and mildly FDG avid mediastinal, bihilar, and subcarinal lymph nodes. No other sites concerning for metastatic disease were appreciated elsewhere in the body. Given that there is encasement of the carotid, surgical resection was not advised by Dr. Christoper Allegra and chemo/radiation was recommended. She will receive  35 fractions of radiation to her Oropharynx and bilateral neck with weekly cisplatin. She started radiation on 08/20/22 and will start chemo on 08/24/22 and will complete 10/08/22. 08/21/22 PEG/PAC    PATIENT GOALS:  Reassess how my recovery is going related to arm function, pain, and swelling.  PAIN:  Are you having pain? No  PRECAUTIONS: Head and neck radiation  RED FLAGS: None   ACTIVITY LEVEL / LEISURE: none   OBJECTIVE:   COGNITION: Overall cognitive status: Within functional limits for tasks assessed  POSTURE:  Forward head and rounded shoulders posture   30 SEC SIT TO STAND: 14 reps in 30 sec without use of UEs which is  Average for patient's age   SHOULDER AROM:   Impaired  L shoulder ROM is WFL, R shoulder ROM is limited to about 90 degrees of flexion and approx 45 degrees of abduction, pt reports she thinks it may be from the tumor. She also reports she felt like she tore something in her shoulder and reports she could hear it and it was painful then.      CERVICAL AROM:     Percent limited 01/07/23  Flexion WFL WFL  Extension [facility] WFL  Right lateral flexion [facility] WFL  Left lateral flexion [facility] WFL  Right rotation 30% limited WFL  Left rotation WFL WFL+                          (Blank rows=not tested)    LYMPHEDEMA ASSESSMENT:    Circumference in cm  4 cm superior to sternal notch around neck 36.5  6 cm superior to sternal notch around neck 35.6  8 cm superior to sternal notch around neck 37  R lateral nostril from base of nose to medial tragus   L lateral nostril from base of nose to medial tragus   R corner of mouth to where ear lobe meets face   L corner of mouth to where ear lobe meets face   10 cm superior to sternal notch around neck 38.2     (Blank rows=not tested)  TREATMENT PERFORMED: 01/22/23: Had pt demonstrate entire sequence following Norton anterior approach handout in sitting as follows: short neck, 5 diaphragmatic breaths,  bilateral axillary nodes, bilateral pectoral nodes, anterior chest, short neck, posterior neck moving fluid towards pathway aimed at lateral neck, then lateral and anterior neck moving fluid towards pathway aimed at lateral neck then therapist performed second half of session in supine then retracing all steps. Provided v/c for speed today. Pt demonstrated independence with sequence while following handout.   01/17/23: Had pt demonstrate entire sequence following Norton anterior approach handout in sitting as follows: short neck, 5 diaphragmatic breaths, bilateral axillary nodes, bilateral pectoral nodes, anterior chest, short neck, posterior neck moving fluid towards pathway aimed at lateral neck, then lateral and anterior neck moving fluid towards pathway aimed at lateral neck then therapist performed second half of session in supine then retracing all steps. Provided v/c for correct pressure and skin stretch. Pt demonstrated independence with sequence while following handout.   01/09/23: Instructed pt using Norton anterior approach handout as follows: short neck, 5 diaphragmatic breaths, bilateral axillary nodes, bilateral pectoral nodes, anterior chest, short neck, posterior neck moving fluid towards pathway aimed at lateral neck, then lateral and anterior neck moving fluid towards pathway aimed at lateral neck then retracing all steps. Had pt return demonstrate each step and provided v/c for correct sequence and skin stretch as well as t/c for pressure. Therapist completed second half of session.    01/07/23: Created foam chip for pt to wear around head and neck for compression Instructed pt in basic MLD for anterior neck as follows and had her return demonstrate seated in front of mirror: 5 diaphragmatic breaths, bilateral axilla, bilateral pectoral nodes, short neck, anterior neck then retracing steps while providing v/c for correct sequence and skin stretch  PATIENT EDUCATION:  Education details:  self MLD - abbreviated version of Norton anterior approach, wear chip  pack at least 4 hours/day, do not tie too tightly, anatomy and physiology of the lymphatic system Person educated: Patient Education method: Explanation Education comprehension: verbalized understanding  HOME EXERCISE PROGRAM: Reviewed previously given post op HEP. Wear chip pack at least 4 hours a day  ASSESSMENT:  CLINICAL IMPRESSION: Continued instructing pt in self MLD technique. Pt required less v/c today for pressure and skin stretch and only needed occasional v/c for speed. Her swelling was notably reduced by end of session.   Pt will benefit from skilled therapeutic intervention to improve on the following deficits: Decreased knowledge of precautions, impaired UE functional use, pain, decreased ROM, postural dysfunction, increased edema.  PT treatment/interventions: ADL/Self care home management, Therapeutic exercises, Therapeutic activity, Patient/Family education, Self Care, Joint mobilization, Manual lymph drainage, Compression bandaging, scar mobilization, Taping, Vasopneumatic device, and Manual therapy   GOALS: Goals reviewed with patient? Yes  LONG TERM GOALS:  (STG=LTG)  GOALS Name Target Date  Goal status  1 Pt will demonstrate she has regained full shoulder ROM and function post operatively compared to baselines.  Baseline: 01/06/33 MET  2 Pt will be independent in self MLD for long term management of lymphedema. 02/04/23 INITIAL  3 Pt will obtain a compression garment for long term management of lymphedema. 02/04/23 INITIAL  4        PLAN:  PT FREQUENCY/DURATION: 2x/wk for 4 wks  PLAN FOR NEXT SESSION: measure R shoulder ROM and compare to L - add goal as needed, begin MLD to anterior neck and issued handout    Milagros Loll Blue, PT 01/22/2023, 1:12 PM

## 2023-01-23 DIAGNOSIS — D3617 Benign neoplasm of peripheral nerves and autonomic nervous system of trunk, unspecified: Secondary | ICD-10-CM | POA: Diagnosis not present

## 2023-01-23 DIAGNOSIS — D0359 Melanoma in situ of other part of trunk: Secondary | ICD-10-CM | POA: Diagnosis not present

## 2023-01-23 DIAGNOSIS — C4359 Malignant melanoma of other part of trunk: Secondary | ICD-10-CM | POA: Diagnosis not present

## 2023-01-29 ENCOUNTER — Encounter: Payer: Self-pay | Admitting: Physical Therapy

## 2023-01-29 ENCOUNTER — Ambulatory Visit (INDEPENDENT_AMBULATORY_CARE_PROVIDER_SITE_OTHER): Payer: Medicare Other | Admitting: Family

## 2023-01-29 ENCOUNTER — Ambulatory Visit: Payer: Medicare Other | Admitting: Physical Therapy

## 2023-01-29 VITALS — BP 114/57 | HR 88 | Temp 98.0°F | Resp 16 | Ht 61.0 in | Wt 129.0 lb

## 2023-01-29 DIAGNOSIS — E041 Nontoxic single thyroid nodule: Secondary | ICD-10-CM

## 2023-01-29 DIAGNOSIS — I10 Essential (primary) hypertension: Secondary | ICD-10-CM | POA: Diagnosis not present

## 2023-01-29 DIAGNOSIS — L599 Disorder of the skin and subcutaneous tissue related to radiation, unspecified: Secondary | ICD-10-CM

## 2023-01-29 DIAGNOSIS — K219 Gastro-esophageal reflux disease without esophagitis: Secondary | ICD-10-CM | POA: Insufficient documentation

## 2023-01-29 DIAGNOSIS — R293 Abnormal posture: Secondary | ICD-10-CM | POA: Diagnosis not present

## 2023-01-29 DIAGNOSIS — I890 Lymphedema, not elsewhere classified: Secondary | ICD-10-CM | POA: Diagnosis not present

## 2023-01-29 DIAGNOSIS — M25611 Stiffness of right shoulder, not elsewhere classified: Secondary | ICD-10-CM | POA: Diagnosis not present

## 2023-01-29 DIAGNOSIS — C770 Secondary and unspecified malignant neoplasm of lymph nodes of head, face and neck: Secondary | ICD-10-CM

## 2023-01-29 DIAGNOSIS — E43 Unspecified severe protein-calorie malnutrition: Secondary | ICD-10-CM | POA: Diagnosis not present

## 2023-01-29 NOTE — Therapy (Signed)
OUTPATIENT PHYSICAL THERAPY BREAST CANCER POST OP FOLLOW UP   Patient Name: Nichole Dominguez MRN: [id number] DOB:09/17/1955, 67 y.o., female Today's Date: 01/29/2023  END OF SESSION:  PT End of Session - 01/29/23 1457     Visit Number 6    Number of Visits 10    Date for PT Re-Evaluation 02/04/23    PT Start Time 1456    PT Stop Time 1545    PT Time Calculation (min) 49 min    Activity Tolerance Patient tolerated treatment well    Behavior During Therapy WFL for tasks assessed/performed              Past Medical History:  Diagnosis Date   Allergy    GERD (gastroesophageal reflux disease)    Melanoma (HCC) 12/25/2022   right posterior shoulder ([facility])   Osteopenia 01/04/2020   Plantar fasciitis    Seasonal allergies    Syncope and collapse 12/21/2022   Past Surgical History:  Procedure Laterality Date   COLONOSCOPY  2011   IR GASTROSTOMY TUBE MOD SED  08/21/2022   IR IMAGING GUIDED PORT INSERTION  08/21/2022   IR PATIENT EVAL TECH 0-60 MINS  08/31/2022   IR PATIENT EVAL TECH 0-60 MINS  09/07/2022   MOUTH SURGERY  01/2020   implant inserted   TUBAL LIGATION  1992   Patient Active Problem List   Diagnosis Date Noted   GERD (gastroesophageal reflux disease) 01/29/2023   Orthostatic syncope 12/26/2022   Melanoma (HCC) 12/25/2022   Pancytopenia, acquired (HCC) 10/12/2022   Cancer associated pain 10/12/2022   Other constipation 10/12/2022   Hypomagnesemia 10/12/2022   Port-A-Cath in place 08/24/2022   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   Lower extremity edema 10/03/2021   Hypertension 08/23/2021   Eczema 12/01/2020   Osteopenia 01/04/2020   Pap smear for cervical cancer screening 10/06/2014   Thyroid nodule 10/06/2014   Routine general medical examination at a health care facility 08/21/2013    PCP: [name]  REFERRING PROVIDER: Lonie Peak, MD  REFERRING DIAG: C77.0 (ICD-10-CM) - Secondary malignant neoplasm of cervical lymph node  (HCC)   THERAPY DIAG:  Lymphedema, not elsewhere classified  Disorder of the skin and subcutaneous tissue related to radiation, unspecified  Stiffness of right shoulder, not elsewhere classified  Abnormal posture  Secondary malignant neoplasm of cervical lymph node (HCC)  Rationale for Evaluation and Treatment: Rehabilitation  ONSET DATE: 07/27/22  SUBJECTIVE:  SUBJECTIVE STATEMENT: I was a little slack with the massage because I was at the beach this weekend.   PERTINENT HISTORY:  Secondary malignant neoplasm of cervical lymph node, Stage I (T0,N1, M0, p 16 +), staging does not acknowledge the bulky and infiltrative nature of her dominant neck mass. 05/07/22 She saw Dr. Marene Lenz and laryngoscopy was performed which revealed a relatively indurated mass lesion extending from the mid parotid gland to level II in the neck measuring approx. 4 cm. Biopsy of the parotid mass was non diagnostic. 06/26/22 CT neck completed after she began to develop jaw pain and right neck pain. 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 The mass appeared to encase a portion of the right external carotid artery and partially encases the right internal carotid artery Prominent right-sided cervical and upper mediastinal lymph nodes were also appreciated, suspicious for metastatic spread of disease. 07/17/22 She saw Dr. Christoper Allegra who did a Laryngoscopy performed during this visit was notable for mild right pharyngeal fullness, mild right vocal fold hypomobility, and limited anterior movement of the right tongue. Based on CT findings, Dr. Christoper Allegra recommended proceeding with  repeat IR guided biopsy of the mass, as well as staging work-up including an MRI of the neck and PET scan. 07/27/22 MRI of the face showed the infiltrative mass lesion centered in the right carotid, parapharyngeal and parotid spaces with extensive surrounding local invasion, and findings concerning for metastatic adenopathy in the right neck and additional nonspecific mildly enlarged and rounded lymph nodes involving the upper mediastinum and contralateral left jugular chain. MRI also showed evidence of tumor extension within close proximity to multiple skill base foramina, along with evidence of right hypoglossal nerve involvement contributing to right hemitongue denervation changes, and tumor thrombus within the encased right internal jugular vein. MRI otherwise showed no evidence of intracranial perineural tumor spread. 07/27/22 FNA of the right neck mass collected on 07/27/22 showed carcinoma with squamous features, favoring squamous cell carcinoma (however, other tumors with squamous differentiation cannot entirely be excluded); P16 diffusely positive; HPV focally positive. 08/02/22 PET demonstrated  the dominant right level IIa metastatic nodal mass measuring 4.9 x 2.5 cm with an SUV max of 15; multiple ipsilateral nodal metastases involving the right level 3, level 4, and level 5 lymph node stations including the dominant right level 3 nodal metastasis (measuring 1.1 cm) coming in contact with the right oropharynx without discrete primary tumor; FDG avidity within the left palatine tonsil (normal in size and with a max SUV of 4.4); and mildly FDG avid mediastinal, bihilar, and subcarinal lymph nodes. No other sites concerning for metastatic disease were appreciated elsewhere in the body. Given that there is encasement of the carotid, surgical resection was not advised by Dr. Christoper Allegra and chemo/radiation was recommended. She will receive 35 fractions of radiation to her Oropharynx and bilateral neck with weekly  cisplatin. She started radiation on 08/20/22 and will start chemo on 08/24/22 and will complete 10/08/22. 08/21/22 PEG/PAC    PATIENT GOALS:  Reassess how my recovery is going related to arm function, pain, and swelling.  PAIN:  Are you having pain? No  PRECAUTIONS: Head and neck radiation  RED FLAGS: None   ACTIVITY LEVEL / LEISURE: none   OBJECTIVE:   COGNITION: Overall cognitive status: Within functional limits for tasks assessed  POSTURE:  Forward head and rounded shoulders posture   30 SEC SIT TO STAND: 14 reps in 30 sec without use of UEs which is  Average for patient's age   SHOULDER AROM:  01/28/23: WFL At eval: Impaired  L shoulder ROM is WFL, R shoulder ROM is limited to about 90 degrees of flexion and approx 45 degrees of abduction, pt reports she thinks it may be from the tumor. She also reports she felt like she tore something in her shoulder and reports she could hear it and it was painful then.      CERVICAL AROM:     Percent limited 01/07/23  Flexion WFL WFL  Extension [facility] WFL  Right lateral flexion [facility] WFL  Left lateral flexion [facility] WFL  Right rotation 30% limited WFL  Left rotation WFL WFL+                          (Blank rows=not tested)    LYMPHEDEMA ASSESSMENT:    Circumference in cm  4 cm superior to sternal notch around neck 36.5  6 cm superior to sternal notch around neck 35.6  8 cm superior to sternal notch around neck 37  R lateral nostril from base of nose to medial tragus   L lateral nostril from base of nose to medial tragus   R corner of mouth to where ear lobe meets face   L corner of mouth to where ear lobe meets face   10 cm superior to sternal notch around neck 38.2     (Blank rows=not tested)  TREATMENT PERFORMED: 01/29/23: In supine MLD to neck as follows using Norton anterior approach: short neck, 5 diaphragmatic breaths, bilateral axillary nodes, bilateral pectoral nodes, anterior chest, short neck, posterior  neck moving fluid towards pathway aimed at lateral neck, then lateral and anterior neck moving fluid towards pathway aimed at lateral neck then retracing all steps    01/22/23: Had pt demonstrate entire sequence following Norton anterior approach handout in sitting as follows: short neck, 5 diaphragmatic breaths, bilateral axillary nodes, bilateral pectoral nodes, anterior chest, short neck, posterior neck moving fluid towards pathway aimed at lateral neck, then lateral and anterior neck moving fluid towards pathway aimed at lateral neck then therapist performed second half of session in supine then retracing all steps. Provided v/c for speed today. Pt demonstrated independence with sequence while following handout.   01/17/23: Had pt demonstrate entire sequence following Norton anterior approach handout in sitting as follows: short neck, 5 diaphragmatic breaths, bilateral axillary nodes, bilateral pectoral nodes, anterior chest, short neck, posterior neck moving fluid towards pathway aimed at lateral neck, then lateral and anterior neck moving fluid towards pathway aimed at lateral neck then therapist performed second half of session in supine then retracing all steps. Provided v/c for correct pressure and skin stretch. Pt demonstrated independence with sequence while following handout.   01/09/23: Instructed pt using Norton anterior approach handout as follows: short neck, 5 diaphragmatic breaths, bilateral axillary nodes, bilateral pectoral nodes, anterior chest, short neck, posterior neck moving fluid towards pathway aimed at lateral neck, then lateral and anterior neck moving fluid towards pathway aimed at lateral neck then retracing all steps. Had pt return demonstrate each step and provided v/c for correct sequence and skin stretch as well as t/c for pressure. Therapist completed second half of session.    01/07/23: Created foam chip for pt to wear around head and neck for compression Instructed  pt in basic MLD for anterior neck as follows and had her return demonstrate seated in front of mirror: 5 diaphragmatic breaths, bilateral axilla, bilateral pectoral nodes, short neck, anterior neck then retracing steps while providing v/c for correct sequence and skin stretch  PATIENT EDUCATION:  Education details: self MLD - abbreviated version of Norton anterior approach, wear chip pack at least 4 hours/day, do not tie too tightly, anatomy and physiology of the lymphatic system Person educated: Patient Education method: Explanation Education comprehension: verbalized understanding  HOME EXERCISE PROGRAM: Reviewed previously given post op HEP. Wear chip pack at least 4 hours a day  ASSESSMENT:  CLINICAL IMPRESSION: Continued MLD to anterior neck today with significant improvement in edema noted by end of session. Will continue to educate pt and plan on d/c early next week. Assessed R shoulder ROM and it has improved from eval and is no longer limited.   Pt will benefit from skilled therapeutic intervention to improve on the following deficits: Decreased knowledge of precautions, impaired UE functional use, pain, decreased ROM, postural dysfunction, increased edema.  PT treatment/interventions: ADL/Self care home management, Therapeutic exercises, Therapeutic activity, Patient/Family education, Self Care, Joint mobilization, Manual lymph drainage, Compression bandaging, scar mobilization, Taping, Vasopneumatic device, and Manual therapy   GOALS: Goals reviewed with patient? Yes  LONG TERM GOALS:  (STG=LTG)  GOALS Name Target Date  Goal status  1 Pt will demonstrate she has regained full shoulder ROM and function post operatively compared to baselines.  Baseline: 01/06/33 MET  2 Pt will be independent in self MLD for long term management of lymphedema. 02/04/23 INITIAL  3 Pt will obtain a compression garment for long term management of lymphedema. 02/04/23 01/29/23: MET  4         PLAN:  PT FREQUENCY/DURATION: 2x/wk for 4 wks  PLAN FOR NEXT SESSION: cont MLD to anterior neck and issued handout    Leonette Most, PT 01/29/2023, 3:46 PM

## 2023-01-29 NOTE — Assessment & Plan Note (Signed)
No obvious residual neck cancer noted on PET.  She continues to follow with Oncology and Radiation Oncology. She is also doing PT for some residual lymphedema in the right side of her neck.

## 2023-01-29 NOTE — Assessment & Plan Note (Signed)
Lab Results  Component Value Date   TSH 3.02 12/19/2022   TSH normal last visit.

## 2023-01-29 NOTE — Assessment & Plan Note (Signed)
She is eating and drinking normally.  Last albumin/protein level was normal.  Monitor.

## 2023-01-29 NOTE — Patient Instructions (Signed)
VISIT SUMMARY:  During your recent visit, we discussed your current health status and ongoing management of your conditions. You are currently cancer-free, with a clear PET scan, and your blood pressure is well-controlled. You've made improvements in your dietary habits, increasing your caloric intake and fluid consumption. However, you continue to experience discomfort from your lymph nodes in your neck, which you are managing with self-massage and physical therapy. You also mentioned taking collagen powder and additional protein, and expressed concern about potential constipation, but your bowel movements have been normal.  YOUR PLAN:  -CANCER SURVIVORSHIP: Please continue recommended follow up with Oncology and Radiation Oncology.  -HYPERTENSION: Your blood pressure is well-controlled. We will continue with the current management plan.  -NUTRITION: You've improved your caloric intake and hydration. We will continue to monitor this.  -LYMPHEDEMA: You have ongoing issues with lymph node drainage in your neck. Continue with your physical therapy, self-massage, and wearing your neck compression garment.  -GASTROESOPHAGEAL REFLUX DISEASE (GERD): Your daily symptoms are controlled with omeprazole. Continue taking this medication daily.  -GENERAL HEALTH MAINTENANCE: Continue with your scheduled mammogram and bone density scan. Your immunization status is up-to-date with pneumonia, flu, shingles, and COVID vaccines.  INSTRUCTIONS:  We will conduct a CT scan before your next oncology appointment in February. Please continue with your current management plans for hypertension, nutrition, lymphedema, and GERD. Also, continue with your scheduled mammogram and bone density scan. Follow-up in 6 months or sooner if any changes occur.

## 2023-01-29 NOTE — Assessment & Plan Note (Signed)
Stable with daily omeprazole 20mg . Does not tolerate skipping doses. Continue same.

## 2023-01-29 NOTE — Progress Notes (Signed)
Subjective:     Patient ID: Nichole Dominguez, female    DOB: November 22, 1955, 67 y.o.   MRN: [id number]  Chief Complaint  Patient presents with   Hypertension    Here for follow up    Hypertension    Discussed the use of AI scribe software for clinical note transcription with the patient, who gave verbal consent to proceed.  History of Present Illness   The patient, with a history of cancer and hypertension, presents for a routine blood pressure follow-up. She reports that her PET scan was clear and she is currently cancer-free. However, there is concern about potential radiation damage to one of her lungs, which will be evaluated with a CT scan.  The patient's blood pressure today is 114/57, which is lower than her usual range of 120-125/79-85. She denies any symptoms of dizziness. She has improved her dietary intake, now consuming close to 1000 calories daily, up from 500. She is also drinking more fluids.  The patient continues to experience discomfort from her lymph nodes, which are not draining properly in her neck. She is managing this with self-massage and physical therapy, and by wearing a compression band around her neck.  She has completed her pneumonia shots, received her flu shot for the season, completed both shingles shots, and received her COVID-19 vaccination. She is taking omeprazole daily for acid reflux, which she reports is necessary for symptom control.  The patient has been supplementing her diet with collagen powder and protein from Ensure, and she expresses concern about potential overconsumption of protein. However, she has not experienced constipation, which can be a symptom of excessive protein intake.     BP Readings from Last 3 Encounters:  01/29/23 (!) 114/57  01/11/23 132/70  01/01/23 121/72    Wt Readings from Last 3 Encounters:  01/29/23 129 lb (58.5 kg)  01/11/23 128 lb 2 oz (58.1 kg)  01/01/23 133 lb 3.2 oz (60.4 kg)       Health Maintenance Due   Topic Date Due   Medicare Annual Wellness (AWV)  Never done   HIV Screening  Never done   Hepatitis C Screening  Never done   MAMMOGRAM  01/03/2021   DEXA SCAN  01/03/2022    Past Medical History:  Diagnosis Date   Allergy    GERD (gastroesophageal reflux disease)    Melanoma (HCC) 12/25/2022   right posterior shoulder ([facility])   Osteopenia 01/04/2020   Plantar fasciitis    Seasonal allergies    Syncope and collapse 12/21/2022    Past Surgical History:  Procedure Laterality Date   COLONOSCOPY  2011   IR GASTROSTOMY TUBE MOD SED  08/21/2022   IR IMAGING GUIDED PORT INSERTION  08/21/2022   IR PATIENT EVAL TECH 0-60 MINS  08/31/2022   IR PATIENT EVAL TECH 0-60 MINS  09/07/2022   MOUTH SURGERY  01/2020   implant inserted   TUBAL LIGATION  1992    Family History  Problem Relation Age of Onset   Diabetes Mother    Heart disease Mother        chf   Hypertension Mother    Cervical cancer Mother    Obesity Mother    Colon polyps Mother    Thyroid cancer Father    Cancer Father        thyroid x2   Heart attack Father    Breast cancer Sister    Lupus Sister    Obesity Maternal Grandmother    Diabetes  Maternal Grandmother    Heart disease Maternal Grandmother    Hypertension Maternal Grandmother    Stroke Maternal Grandmother    Diabetes Mellitus II Paternal Grandmother        died from diabetic coma   Cancer Paternal Grandfather        not sure what type   Heart attack Son    Esophageal cancer Neg Hx    Stomach cancer Neg Hx    Rectal cancer Neg Hx    Colon cancer Neg Hx     Social History   Socioeconomic History   Marital status: Married    Spouse name: Not on file   Number of children: Not on file   Years of education: Not on file   Highest education level: Some college, no degree  Occupational History   Not on file  Tobacco Use   Smoking status: Former    Current packs/day: 0.00    Average packs/day: 1 pack/day for 24.0 years (24.0 ttl  pk-yrs)    Types: Cigarettes    Start date: 10/10/1972    Quit date: 10/10/1996    Years since quitting: 26.3   Smokeless tobacco: Never  Vaping Use   Vaping status: Never Used  Substance and Sexual Activity   Alcohol use: Yes    Alcohol/week: 0.0 standard drinks of alcohol    Comment: 1 shot vodka daily   Drug use: No   Sexual activity: Yes    Partners: Male  Other Topics Concern   Not on file  Social History Narrative   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    Lives with husband   1 daughter in [place], 60 grandchildren   Son died   2 grown stepchildren- both Designer, multimedia, grandchildren   Enjoys Corporate investment banker   Social Determinants of Health   Financial Resource Strain: Low Risk  (01/22/2023)   Overall Financial Resource Strain (CARDIA)    Difficulty of Paying Living Expenses: Not hard at all  Food Insecurity: No Food Insecurity (01/22/2023)   Hunger Vital Sign    Worried About Running Out of Food in the Last Year: Never true    Ran Out of Food in the Last Year: Never true  Transportation Needs: No Transportation Needs (01/22/2023)   PRAPARE - Administrator, Civil Service (Medical): No    Lack of Transportation (Non-Medical): No  Physical Activity: Unknown (01/22/2023)   Exercise Vital Sign    Days of Exercise per Week: 0 days    Minutes of Exercise per Session: Not on file  Stress: No Stress Concern Present (01/22/2023)   [employer] of Occupational Health - Occupational Stress Questionnaire    Feeling of Stress : Not at all  Social Connections: Moderately Integrated (01/22/2023)   Social Connection and Isolation Panel [NHANES]    Frequency of Communication with Friends and Family: Twice a week    Frequency of Social Gatherings with Friends and Family: Twice a week    Attends Religious Services: 1 to 4 times per year    Active Member of [employer] or Organizations: No    Attends Engineer, structural: Not on file    Marital Status:  Married  Recent Concern: Social Connections - Moderately Isolated (12/24/2022)   Social Connection and Isolation Panel [NHANES]    Frequency of Communication with Friends and Family: More than three times a week    Frequency of Social Gatherings with Friends and Family: Twice a week    Attends Religious  Services: Never    Active Member of Clubs or Organizations: No    Attends Engineer, structural: Not on file    Marital Status: Married  Intimate Partner Violence: Not At Risk (08/09/2022)   Humiliation, Afraid, Rape, and Kick questionnaire    Fear of Current or Ex-Partner: No    Emotionally Abused: No    Physically Abused: No    Sexually Abused: No    Outpatient Medications Prior to Visit  Medication Sig Dispense Refill   acetaminophen (TYLENOL) 500 MG tablet Take 1,500 mg by mouth every 6 (six) hours as needed.     augmented betamethasone dipropionate (DIPROLENE-AF) 0.05 % ointment Apply topically 2 (two) times daily. 15 g 0   Calcium Carbonate (CALCIUM 600 HIGH POTENCY PO) Take 3 capsules by mouth daily.     cholecalciferol (VITAMIN D3) 25 MCG (1000 UNIT) tablet Take 1,000 Units by mouth daily.     fluticasone (FLONASE) 50 MCG/ACT nasal spray Place 2 sprays into both nostrils daily. 48 g 3   Magnesium 500 MG CAPS Take 1 capsule (500 mg total) by mouth daily.     Multiple Vitamins-Minerals (MULTIVITAMIN WITH MINERALS) tablet Take 1 tablet by mouth daily.     omeprazole (PRILOSEC) 20 MG capsule Take 1 capsule (20 mg total) by mouth every morning. 90 capsule 1   No facility-administered medications prior to visit.    Allergies  Allergen Reactions   Amlodipine     swelling   Darvon [Propoxyphene] Rash   Sudafed [Pseudoephedrine Hcl]     rash    ROS     Objective:    Physical Exam Constitutional:      General: She is not in acute distress.    Appearance: Normal appearance. She is well-developed.  HENT:     Head: Normocephalic and atraumatic.     Right Ear:  External ear normal.     Left Ear: External ear normal.  Eyes:     General: No scleral icterus. Neck:     Thyroid: No thyromegaly.     Comments: Some mild swelling of soft tissues of right side of neck Cardiovascular:     Rate and Rhythm: Normal rate and regular rhythm.     Heart sounds: Normal heart sounds. No murmur heard. Pulmonary:     Effort: Pulmonary effort is normal. No respiratory distress.     Breath sounds: Normal breath sounds. No wheezing.  Musculoskeletal:     Cervical back: Neck supple.  Skin:    General: Skin is warm and dry.  Neurological:     Mental Status: She is alert and oriented to person, place, and time.  Psychiatric:        Mood and Affect: Mood normal.        Behavior: Behavior normal.        Thought Content: Thought content normal.        Judgment: Judgment normal.      BP (!) 114/57 (BP Location: Right Arm, Patient Position: Sitting, Cuff Size: Small)   Pulse 88   Temp 98 F (36.7 C) (Oral)   Resp 16   Ht 5\' 1"  (1.549 m)   Wt 129 lb (58.5 kg)   SpO2 99%   BMI 24.37 kg/m  Wt Readings from Last 3 Encounters:  01/29/23 129 lb (58.5 kg)  01/11/23 128 lb 2 oz (58.1 kg)  01/01/23 133 lb 3.2 oz (60.4 kg)       Assessment & Plan:   Problem List Items  Addressed This Visit       Unprioritized   Thyroid nodule    Lab Results  Component Value Date   TSH 3.02 12/19/2022   TSH normal last visit.       RESOLVED: Severe protein-calorie malnutrition (HCC)    She is eating and drinking normally.  Last albumin/protein level was normal.  Monitor.        RESOLVED: Secondary malignant neoplasm of cervical lymph node (HCC)    No obvious residual neck cancer noted on PET.  She continues to follow with Oncology and Radiation Oncology. She is also doing PT for some residual lymphedema in the right side of her neck.       Hypertension    BP stable. No symptoms of orthostasis.  Monitor off of medications.      GERD (gastroesophageal reflux  disease) - Primary    Stable with daily omeprazole 20mg . Does not tolerate skipping doses. Continue same.       I am having Nichole Dominguez maintain her multivitamin with minerals, Calcium Carbonate (CALCIUM 600 HIGH POTENCY PO), cholecalciferol, augmented betamethasone dipropionate, acetaminophen, fluticasone, omeprazole, and Magnesium.  No orders of the defined types were placed in this encounter.

## 2023-01-29 NOTE — Assessment & Plan Note (Signed)
BP stable. No symptoms of orthostasis.  Monitor off of medications.

## 2023-01-30 ENCOUNTER — Ambulatory Visit (HOSPITAL_BASED_OUTPATIENT_CLINIC_OR_DEPARTMENT_OTHER)
Admission: RE | Admit: 2023-01-30 | Discharge: 2023-01-30 | Disposition: A | Discharge status: Home / Self Care | Payer: Medicare Other | Source: Ambulatory Visit | Attending: Family | Admitting: Family

## 2023-01-30 ENCOUNTER — Encounter (HOSPITAL_BASED_OUTPATIENT_CLINIC_OR_DEPARTMENT_OTHER): Payer: Self-pay

## 2023-01-30 DIAGNOSIS — M8589 Other specified disorders of bone density and structure, multiple sites: Secondary | ICD-10-CM | POA: Insufficient documentation

## 2023-01-30 DIAGNOSIS — Z1231 Encounter for screening mammogram for malignant neoplasm of breast: Secondary | ICD-10-CM | POA: Insufficient documentation

## 2023-01-30 DIAGNOSIS — E348 Other specified endocrine disorders: Secondary | ICD-10-CM | POA: Diagnosis not present

## 2023-01-30 DIAGNOSIS — Z780 Asymptomatic menopausal state: Secondary | ICD-10-CM | POA: Diagnosis not present

## 2023-01-30 DIAGNOSIS — Z13820 Encounter for screening for osteoporosis: Secondary | ICD-10-CM | POA: Diagnosis not present

## 2023-01-30 DIAGNOSIS — Z87891 Personal history of nicotine dependence: Secondary | ICD-10-CM | POA: Insufficient documentation

## 2023-01-30 DIAGNOSIS — E559 Vitamin D deficiency, unspecified: Secondary | ICD-10-CM | POA: Diagnosis not present

## 2023-01-31 ENCOUNTER — Encounter: Payer: Self-pay | Admitting: Physical Therapy

## 2023-01-31 ENCOUNTER — Ambulatory Visit: Payer: Medicare Other | Admitting: Physical Therapy

## 2023-01-31 DIAGNOSIS — I890 Lymphedema, not elsewhere classified: Secondary | ICD-10-CM | POA: Diagnosis not present

## 2023-01-31 DIAGNOSIS — L599 Disorder of the skin and subcutaneous tissue related to radiation, unspecified: Secondary | ICD-10-CM

## 2023-01-31 DIAGNOSIS — C770 Secondary and unspecified malignant neoplasm of lymph nodes of head, face and neck: Secondary | ICD-10-CM | POA: Diagnosis not present

## 2023-01-31 DIAGNOSIS — M25611 Stiffness of right shoulder, not elsewhere classified: Secondary | ICD-10-CM | POA: Diagnosis not present

## 2023-01-31 DIAGNOSIS — R293 Abnormal posture: Secondary | ICD-10-CM | POA: Diagnosis not present

## 2023-01-31 NOTE — Therapy (Signed)
OUTPATIENT PHYSICAL THERAPY BREAST CANCER POST OP FOLLOW UP   Patient Name: Nichole Dominguez MRN: [id number] DOB:1955-09-26, 67 y.o., female Today's Date: 01/31/2023  END OF SESSION:  PT End of Session - 01/31/23 1206     Visit Number 7    Number of Visits 10    Date for PT Re-Evaluation 02/04/23    PT Start Time 1206    PT Stop Time 1248    PT Time Calculation (min) 42 min    Activity Tolerance Patient tolerated treatment well    Behavior During Therapy WFL for tasks assessed/performed              Past Medical History:  Diagnosis Date   Allergy    GERD (gastroesophageal reflux disease)    Melanoma (HCC) 12/25/2022   right posterior shoulder ([facility])   Osteopenia 01/04/2020   Plantar fasciitis    Seasonal allergies    Syncope and collapse 12/21/2022   Past Surgical History:  Procedure Laterality Date   COLONOSCOPY  2011   IR GASTROSTOMY TUBE MOD SED  08/21/2022   IR IMAGING GUIDED PORT INSERTION  08/21/2022   IR PATIENT EVAL TECH 0-60 MINS  08/31/2022   IR PATIENT EVAL TECH 0-60 MINS  09/07/2022   MOUTH SURGERY  01/2020   implant inserted   TUBAL LIGATION  1992   Patient Active Problem List   Diagnosis Date Noted   GERD (gastroesophageal reflux disease) 01/29/2023   Orthostatic syncope 12/26/2022   Melanoma (HCC) 12/25/2022   Pancytopenia, acquired (HCC) 10/12/2022   Cancer associated pain 10/12/2022   Other constipation 10/12/2022   Hypomagnesemia 10/12/2022   Port-A-Cath in place 08/24/2022   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   Lower extremity edema 10/03/2021   Hypertension 08/23/2021   Eczema 12/01/2020   Osteopenia 01/04/2020   Pap smear for cervical cancer screening 10/06/2014   Thyroid nodule 10/06/2014   Routine general medical examination at a health care facility 08/21/2013    PCP: [name]  REFERRING PROVIDER: Lonie Peak, MD  REFERRING DIAG: C77.0 (ICD-10-CM) - Secondary malignant neoplasm of cervical lymph node  (HCC)   THERAPY DIAG:  Lymphedema, not elsewhere classified  Disorder of the skin and subcutaneous tissue related to radiation, unspecified  Stiffness of right shoulder, not elsewhere classified  Abnormal posture  Secondary malignant neoplasm of cervical lymph node (HCC)  Rationale for Evaluation and Treatment: Rehabilitation  ONSET DATE: 07/27/22  SUBJECTIVE:  SUBJECTIVE STATEMENT: The swelling is doing good. I wear the garment when I am sitting and watching TV.    PERTINENT HISTORY:  Secondary malignant neoplasm of cervical lymph node, Stage I (T0,N1, M0, p 16 +), staging does not acknowledge the bulky and infiltrative nature of her dominant neck mass. 05/07/22 She saw Dr. Marene Lenz and laryngoscopy was performed which revealed a relatively indurated mass lesion extending from the mid parotid gland to level II in the neck measuring approx. 4 cm. Biopsy of the parotid mass was non diagnostic. 06/26/22 CT neck completed after she began to develop jaw pain and right neck pain. 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 The mass appeared to encase a portion of the right external carotid artery and partially encases the right internal carotid artery Prominent right-sided cervical and upper mediastinal lymph nodes were also appreciated, suspicious for metastatic spread of disease. 07/17/22 She saw Dr. Christoper Allegra who did a Laryngoscopy performed during this visit was notable for mild right pharyngeal fullness, mild right vocal fold hypomobility, and limited anterior movement of the right tongue. Based on CT findings, Dr. Christoper Allegra recommended proceeding  with repeat IR guided biopsy of the mass, as well as staging work-up including an MRI of the neck and PET scan. 07/27/22 MRI of the face showed the infiltrative mass lesion centered in the right carotid, parapharyngeal and parotid spaces with extensive surrounding local invasion, and findings concerning for metastatic adenopathy in the right neck and additional nonspecific mildly enlarged and rounded lymph nodes involving the upper mediastinum and contralateral left jugular chain. MRI also showed evidence of tumor extension within close proximity to multiple skill base foramina, along with evidence of right hypoglossal nerve involvement contributing to right hemitongue denervation changes, and tumor thrombus within the encased right internal jugular vein. MRI otherwise showed no evidence of intracranial perineural tumor spread. 07/27/22 FNA of the right neck mass collected on 07/27/22 showed carcinoma with squamous features, favoring squamous cell carcinoma (however, other tumors with squamous differentiation cannot entirely be excluded); P16 diffusely positive; HPV focally positive. 08/02/22 PET demonstrated  the dominant right level IIa metastatic nodal mass measuring 4.9 x 2.5 cm with an SUV max of 15; multiple ipsilateral nodal metastases involving the right level 3, level 4, and level 5 lymph node stations including the dominant right level 3 nodal metastasis (measuring 1.1 cm) coming in contact with the right oropharynx without discrete primary tumor; FDG avidity within the left palatine tonsil (normal in size and with a max SUV of 4.4); and mildly FDG avid mediastinal, bihilar, and subcarinal lymph nodes. No other sites concerning for metastatic disease were appreciated elsewhere in the body. Given that there is encasement of the carotid, surgical resection was not advised by Dr. Christoper Allegra and chemo/radiation was recommended. She will receive 35 fractions of radiation to her Oropharynx and bilateral neck with weekly  cisplatin. She started radiation on 08/20/22 and will start chemo on 08/24/22 and will complete 10/08/22. 08/21/22 PEG/PAC    PATIENT GOALS:  Reassess how my recovery is going related to arm function, pain, and swelling.  PAIN:  Are you having pain? No  PRECAUTIONS: Head and neck radiation  RED FLAGS: None   ACTIVITY LEVEL / LEISURE: none   OBJECTIVE:   COGNITION: Overall cognitive status: Within functional limits for tasks assessed  POSTURE:  Forward head and rounded shoulders posture   30 SEC SIT TO STAND: 14 reps in 30 sec without use of UEs which is  Average for patient's age   SHOULDER AROM:  01/28/23: WFL At eval: Impaired  L shoulder ROM is WFL, R shoulder ROM is limited to about 90 degrees of flexion and approx 45 degrees of abduction, pt reports she thinks it may be from the tumor. She also reports she felt like she tore something in her shoulder and reports she could hear it and it was painful then.      CERVICAL AROM:     Percent limited 01/07/23  Flexion WFL WFL  Extension [facility] WFL  Right lateral flexion [facility] WFL  Left lateral flexion [facility] WFL  Right rotation 30% limited WFL  Left rotation WFL WFL+                          (Blank rows=not tested)    LYMPHEDEMA ASSESSMENT:    Circumference in cm  4 cm superior to sternal notch around neck 36.5  6 cm superior to sternal notch around neck 35.6  8 cm superior to sternal notch around neck 37  R lateral nostril from base of nose to medial tragus   L lateral nostril from base of nose to medial tragus   R corner of mouth to where ear lobe meets face   L corner of mouth to where ear lobe meets face   10 cm superior to sternal notch around neck 38.2     (Blank rows=not tested)  TREATMENT PERFORMED: 01/31/23: In supine MLD to neck as follows using Norton anterior approach: short neck, 5 diaphragmatic breaths, bilateral axillary nodes, bilateral pectoral nodes, anterior chest, short neck, posterior  neck moving fluid towards pathway aimed at lateral neck, then lateral and anterior neck moving fluid towards pathway aimed at lateral neck then retracing all steps   01/29/23: In supine MLD to neck as follows using Norton anterior approach: short neck, 5 diaphragmatic breaths, bilateral axillary nodes, bilateral pectoral nodes, anterior chest, short neck, posterior neck moving fluid towards pathway aimed at lateral neck, then lateral and anterior neck moving fluid towards pathway aimed at lateral neck then retracing all steps    01/22/23: Had pt demonstrate entire sequence following Norton anterior approach handout in sitting as follows: short neck, 5 diaphragmatic breaths, bilateral axillary nodes, bilateral pectoral nodes, anterior chest, short neck, posterior neck moving fluid towards pathway aimed at lateral neck, then lateral and anterior neck moving fluid towards pathway aimed at lateral neck then therapist performed second half of session in supine then retracing all steps. Provided v/c for speed today. Pt demonstrated independence with sequence while following handout.   01/17/23: Had pt demonstrate entire sequence following Norton anterior approach handout in sitting as follows: short neck, 5 diaphragmatic breaths, bilateral axillary nodes, bilateral pectoral nodes, anterior chest, short neck, posterior neck moving fluid towards pathway aimed at lateral neck, then lateral and anterior neck moving fluid towards pathway aimed at lateral neck then therapist performed second half of session in supine then retracing all steps. Provided v/c for correct pressure and skin stretch. Pt demonstrated independence with sequence while following handout.   01/09/23: Instructed pt using Norton anterior approach handout as follows: short neck, 5 diaphragmatic breaths, bilateral axillary nodes, bilateral pectoral nodes, anterior chest, short neck, posterior neck moving fluid towards pathway aimed at lateral neck,  then lateral and anterior neck moving  fluid towards pathway aimed at lateral neck then retracing all steps. Had pt return demonstrate each step and provided v/c for correct sequence and skin stretch as well as t/c for pressure. Therapist completed second half of session.    01/07/23: Created foam chip for pt to wear around head and neck for compression Instructed pt in basic MLD for anterior neck as follows and had her return demonstrate seated in front of mirror: 5 diaphragmatic breaths, bilateral axilla, bilateral pectoral nodes, short neck, anterior neck then retracing steps while providing v/c for correct sequence and skin stretch  PATIENT EDUCATION:  Education details: self MLD - abbreviated version of Norton anterior approach, wear chip pack at least 4 hours/day, do not tie too tightly, anatomy and physiology of the lymphatic system Person educated: Patient Education method: Explanation Education comprehension: verbalized understanding  HOME EXERCISE PROGRAM: Reviewed previously given post op HEP. Wear chip pack at least 4 hours a day  ASSESSMENT:  CLINICAL IMPRESSION: Continued MLD to anterior neck today with decreased edema noted by end of session. Educated pt to bring the garment she ordered from online to next session so therapist can assess it prior to discharge.   Pt will benefit from skilled therapeutic intervention to improve on the following deficits: Decreased knowledge of precautions, impaired UE functional use, pain, decreased ROM, postural dysfunction, increased edema.  PT treatment/interventions: ADL/Self care home management, Therapeutic exercises, Therapeutic activity, Patient/Family education, Self Care, Joint mobilization, Manual lymph drainage, Compression bandaging, scar mobilization, Taping, Vasopneumatic device, and Manual therapy   GOALS: Goals reviewed with patient? Yes  LONG TERM GOALS:  (STG=LTG)  GOALS Name Target Date  Goal status  1 Pt will  demonstrate she has regained full shoulder ROM and function post operatively compared to baselines.  Baseline: 01/06/33 MET  2 Pt will be independent in self MLD for long term management of lymphedema. 02/04/23 INITIAL  3 Pt will obtain a compression garment for long term management of lymphedema. 02/04/23 01/29/23: MET  4        PLAN:  PT FREQUENCY/DURATION: 2x/wk for 4 wks  PLAN FOR NEXT SESSION: cont MLD to anterior neck and issued handout    Leonette Most, PT 01/31/2023, 12:55 PM

## 2023-02-05 ENCOUNTER — Encounter: Payer: Self-pay | Admitting: Physical Therapy

## 2023-02-05 ENCOUNTER — Ambulatory Visit: Payer: Medicare Other | Admitting: Physical Therapy

## 2023-02-05 DIAGNOSIS — L599 Disorder of the skin and subcutaneous tissue related to radiation, unspecified: Secondary | ICD-10-CM | POA: Diagnosis not present

## 2023-02-05 DIAGNOSIS — I890 Lymphedema, not elsewhere classified: Secondary | ICD-10-CM | POA: Diagnosis not present

## 2023-02-05 DIAGNOSIS — C770 Secondary and unspecified malignant neoplasm of lymph nodes of head, face and neck: Secondary | ICD-10-CM | POA: Diagnosis not present

## 2023-02-05 DIAGNOSIS — R293 Abnormal posture: Secondary | ICD-10-CM

## 2023-02-05 DIAGNOSIS — M25611 Stiffness of right shoulder, not elsewhere classified: Secondary | ICD-10-CM

## 2023-02-05 NOTE — Therapy (Signed)
OUTPATIENT PHYSICAL THERAPY BREAST CANCER POST OP FOLLOW UP   Patient Name: Nichole Dominguez MRN: [id number] DOB:1956/01/23, 67 y.o., female Today's Date: 02/05/2023  END OF SESSION:  PT End of Session - 02/05/23 1251     Visit Number 8    Number of Visits 10    Date for PT Re-Evaluation 02/04/23    PT Start Time 1205    PT Stop Time 1251    PT Time Calculation (min) 46 min    Activity Tolerance Patient tolerated treatment well    Behavior During Therapy WFL for tasks assessed/performed               Past Medical History:  Diagnosis Date   Allergy    GERD (gastroesophageal reflux disease)    Melanoma (HCC) 12/25/2022   right posterior shoulder ([facility])   Osteopenia 01/04/2020   Plantar fasciitis    Seasonal allergies    Syncope and collapse 12/21/2022   Past Surgical History:  Procedure Laterality Date   COLONOSCOPY  2011   IR GASTROSTOMY TUBE MOD SED  08/21/2022   IR IMAGING GUIDED PORT INSERTION  08/21/2022   IR PATIENT EVAL TECH 0-60 MINS  08/31/2022   IR PATIENT EVAL TECH 0-60 MINS  09/07/2022   MOUTH SURGERY  01/2020   implant inserted   TUBAL LIGATION  1992   Patient Active Problem List   Diagnosis Date Noted   GERD (gastroesophageal reflux disease) 01/29/2023   Orthostatic syncope 12/26/2022   Melanoma (HCC) 12/25/2022   Pancytopenia, acquired (HCC) 10/12/2022   Cancer associated pain 10/12/2022   Other constipation 10/12/2022   Hypomagnesemia 10/12/2022   Port-A-Cath in place 08/24/2022   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   Lower extremity edema 10/03/2021   Hypertension 08/23/2021   Eczema 12/01/2020   Osteopenia 01/04/2020   Pap smear for cervical cancer screening 10/06/2014   Thyroid nodule 10/06/2014   Routine general medical examination at a health care facility 08/21/2013    PCP: [name]  REFERRING PROVIDER: Lonie Peak, MD  REFERRING DIAG: C77.0 (ICD-10-CM) - Secondary malignant neoplasm of cervical lymph  node (HCC)   THERAPY DIAG:  Lymphedema, not elsewhere classified  Disorder of the skin and subcutaneous tissue related to radiation, unspecified  Stiffness of right shoulder, not elsewhere classified  Abnormal posture  Secondary malignant neoplasm of cervical lymph node (HCC)  Rationale for Evaluation and Treatment: Rehabilitation  ONSET DATE: 07/27/22  SUBJECTIVE:  SUBJECTIVE STATEMENT: The swelling is doing good. I wear the garment when I am sitting and watching TV.    PERTINENT HISTORY:  Secondary malignant neoplasm of cervical lymph node, Stage I (T0,N1, M0, p 16 +), staging does not acknowledge the bulky and infiltrative nature of her dominant neck mass. 05/07/22 She saw Dr. Marene Lenz and laryngoscopy was performed which revealed a relatively indurated mass lesion extending from the mid parotid gland to level II in the neck measuring approx. 4 cm. Biopsy of the parotid mass was non diagnostic. 06/26/22 CT neck completed after she began to develop jaw pain and right neck pain. 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 The mass appeared to encase a portion of the right external carotid artery and partially encases the right internal carotid artery Prominent right-sided cervical and upper mediastinal lymph nodes were also appreciated, suspicious for metastatic spread of disease. 07/17/22 She saw Dr. Christoper Allegra who did a Laryngoscopy performed during this visit was notable for mild right pharyngeal fullness, mild right vocal fold hypomobility, and limited anterior movement of the right tongue. Based on CT findings, Dr. Christoper Allegra recommended  proceeding with repeat IR guided biopsy of the mass, as well as staging work-up including an MRI of the neck and PET scan. 07/27/22 MRI of the face showed the infiltrative mass lesion centered in the right carotid, parapharyngeal and parotid spaces with extensive surrounding local invasion, and findings concerning for metastatic adenopathy in the right neck and additional nonspecific mildly enlarged and rounded lymph nodes involving the upper mediastinum and contralateral left jugular chain. MRI also showed evidence of tumor extension within close proximity to multiple skill base foramina, along with evidence of right hypoglossal nerve involvement contributing to right hemitongue denervation changes, and tumor thrombus within the encased right internal jugular vein. MRI otherwise showed no evidence of intracranial perineural tumor spread. 07/27/22 FNA of the right neck mass collected on 07/27/22 showed carcinoma with squamous features, favoring squamous cell carcinoma (however, other tumors with squamous differentiation cannot entirely be excluded); P16 diffusely positive; HPV focally positive. 08/02/22 PET demonstrated  the dominant right level IIa metastatic nodal mass measuring 4.9 x 2.5 cm with an SUV max of 15; multiple ipsilateral nodal metastases involving the right level 3, level 4, and level 5 lymph node stations including the dominant right level 3 nodal metastasis (measuring 1.1 cm) coming in contact with the right oropharynx without discrete primary tumor; FDG avidity within the left palatine tonsil (normal in size and with a max SUV of 4.4); and mildly FDG avid mediastinal, bihilar, and subcarinal lymph nodes. No other sites concerning for metastatic disease were appreciated elsewhere in the body. Given that there is encasement of the carotid, surgical resection was not advised by Dr. Christoper Allegra and chemo/radiation was recommended. She will receive 35 fractions of radiation to her Oropharynx and bilateral neck  with weekly cisplatin. She started radiation on 08/20/22 and will start chemo on 08/24/22 and will complete 10/08/22. 08/21/22 PEG/PAC    PATIENT GOALS:  Reassess how my recovery is going related to arm function, pain, and swelling.  PAIN:  Are you having pain? No  PRECAUTIONS: Head and neck radiation  RED FLAGS: None   ACTIVITY LEVEL / LEISURE: none   OBJECTIVE:   COGNITION: Overall cognitive status: Within functional limits for tasks assessed  POSTURE:  Forward head and rounded shoulders posture   30 SEC SIT TO STAND: 14 reps in 30 sec without use of UEs which is  Average for patient's age   SHOULDER AROM:  01/28/23: WFL At eval: Impaired  L shoulder ROM is WFL, R shoulder ROM is limited to about 90 degrees of flexion and approx 45 degrees of abduction, pt reports she thinks it may be from the tumor. She also reports she felt like she tore something in her shoulder and reports she could hear it and it was painful then.      CERVICAL AROM:     Percent limited 01/07/23  Flexion WFL WFL  Extension [facility] WFL  Right lateral flexion [facility] WFL  Left lateral flexion [facility] WFL  Right rotation 30% limited WFL  Left rotation WFL WFL+                          (Blank rows=not tested)    LYMPHEDEMA ASSESSMENT:    Circumference in cm  4 cm superior to sternal notch around neck 36.5  6 cm superior to sternal notch around neck 35.6  8 cm superior to sternal notch around neck 37  R lateral nostril from base of nose to medial tragus   L lateral nostril from base of nose to medial tragus   R corner of mouth to where ear lobe meets face   L corner of mouth to where ear lobe meets face   10 cm superior to sternal notch around neck 38.2     (Blank rows=not tested)  TREATMENT PERFORMED: 02/05/23: In supine MLD to neck as follows using Norton anterior approach: short neck, 5 diaphragmatic breaths, bilateral axillary nodes, bilateral pectoral nodes, anterior chest, short neck,  posterior neck moving fluid towards pathway aimed at lateral neck, then lateral and anterior neck moving fluid towards pathway aimed at lateral neck then retracing all steps   01/31/23: In supine MLD to neck as follows using Norton anterior approach: short neck, 5 diaphragmatic breaths, bilateral axillary nodes, bilateral pectoral nodes, anterior chest, short neck, posterior neck moving fluid towards pathway aimed at lateral neck, then lateral and anterior neck moving fluid towards pathway aimed at lateral neck then retracing all steps   01/29/23: In supine MLD to neck as follows using Norton anterior approach: short neck, 5 diaphragmatic breaths, bilateral axillary nodes, bilateral pectoral nodes, anterior chest, short neck, posterior neck moving fluid towards pathway aimed at lateral neck, then lateral and anterior neck moving fluid towards pathway aimed at lateral neck then retracing all steps    01/22/23: Had pt demonstrate entire sequence following Norton anterior approach handout in sitting as follows: short neck, 5 diaphragmatic breaths, bilateral axillary nodes, bilateral pectoral nodes, anterior chest, short neck, posterior neck moving fluid towards pathway aimed at lateral neck, then lateral and anterior neck moving fluid towards pathway aimed at lateral neck then therapist performed second half of session in supine then retracing all steps. Provided v/c for speed today. Pt demonstrated independence with sequence while following handout.   01/17/23: Had pt demonstrate entire sequence following Norton anterior approach handout in sitting as follows: short neck, 5 diaphragmatic breaths, bilateral axillary nodes, bilateral pectoral nodes, anterior chest, short neck, posterior neck moving fluid towards pathway aimed at lateral neck, then lateral and anterior neck moving fluid towards pathway aimed at lateral neck then therapist performed second half of session in supine then retracing all steps.  Provided v/c for  correct pressure and skin stretch. Pt demonstrated independence with sequence while following handout.   01/09/23: Instructed pt using Norton anterior approach handout as follows: short neck, 5 diaphragmatic breaths, bilateral axillary nodes, bilateral pectoral nodes, anterior chest, short neck, posterior neck moving fluid towards pathway aimed at lateral neck, then lateral and anterior neck moving fluid towards pathway aimed at lateral neck then retracing all steps. Had pt return demonstrate each step and provided v/c for correct sequence and skin stretch as well as t/c for pressure. Therapist completed second half of session.    01/07/23: Created foam chip for pt to wear around head and neck for compression Instructed pt in basic MLD for anterior neck as follows and had her return demonstrate seated in front of mirror: 5 diaphragmatic breaths, bilateral axilla, bilateral pectoral nodes, short neck, anterior neck then retracing steps while providing v/c for correct sequence and skin stretch  PATIENT EDUCATION:  Education details: self MLD - abbreviated version of Norton anterior approach, wear chip pack at least 4 hours/day, do not tie too tightly, anatomy and physiology of the lymphatic system Person educated: Patient Education method: Explanation Education comprehension: verbalized understanding  HOME EXERCISE PROGRAM: Reviewed previously given post op HEP. Wear chip pack at least 4 hours a day  ASSESSMENT:  CLINICAL IMPRESSION: Continued MLD to neck today. Assessed her compression garment for proper fit. Pt has met all goals for therapy and will be discharged at this time. Educated her to continue MLD to anterior neck and wear her garment daily.   Pt will benefit from skilled therapeutic intervention to improve on the following deficits: Decreased knowledge of precautions, impaired UE functional use, pain, decreased ROM, postural dysfunction, increased edema.  PT  treatment/interventions: ADL/Self care home management, Therapeutic exercises, Therapeutic activity, Patient/Family education, Self Care, Joint mobilization, Manual lymph drainage, Compression bandaging, scar mobilization, Taping, Vasopneumatic device, and Manual therapy   GOALS: Goals reviewed with patient? Yes  LONG TERM GOALS:  (STG=LTG)  GOALS Name Target Date  Goal status  1 Pt will demonstrate she has regained full shoulder ROM and function post operatively compared to baselines.  Baseline: 01/06/33 MET  2 Pt will be independent in self MLD for long term management of lymphedema. 02/04/23 02/05/23 MET  3 Pt will obtain a compression garment for long term management of lymphedema. 02/04/23 01/29/23: MET  4        PLAN:  PT FREQUENCY/DURATION: 2x/wk for 4 wks  PLAN FOR NEXT SESSION: d/c this visit   Leonette Most, PT 02/05/2023, 12:59 PM  PHYSICAL THERAPY DISCHARGE SUMMARY  Visits from Start of Care: 8  Current functional level related to goals / functional outcomes: All met   Remaining deficits: None    Education / Equipment: Self MLD, compression garment   Patient agrees to discharge. Patient goals were met. Patient is being discharged due to meeting the stated rehab goals.  Milagros Loll [place], [place] 02/05/23 12:59 PM

## 2023-02-07 ENCOUNTER — Encounter: Payer: Medicare Other | Admitting: Physical Therapy

## 2023-02-20 ENCOUNTER — Other Ambulatory Visit: Payer: Self-pay

## 2023-02-20 DIAGNOSIS — C770 Secondary and unspecified malignant neoplasm of lymph nodes of head, face and neck: Secondary | ICD-10-CM

## 2023-02-20 NOTE — Progress Notes (Signed)
Oncology Nurse Navigator Documentation   Ms. Calef called me late last week to request that her PEG be removed. She has not used it in several months and does not plan to use it again for nutrition. She tells me that her weight has remained in the 125 lb range throughout October. She does admit to not having a very good appetite and feeling full quickly after eating a small portion of a meal along with only consuming about 1000 calories daily of food and drinking boost daily. She does see Verdie Mosher regularly for speech/swallowing therapy and denies difficulty with the mechanics of swallowing. I forwarded her request for PEG removal to Dr. Al Pimple and Dr. Basilio Cairo. Both Dr. Al Pimple and Dr. Basilio Cairo do not recommend that her PEG be removed due to her inability to eat complete meals and to gain weight but as long as she understands the possibility of the feeding tube having to be reinserted in the future she can have it removed at this time. I called Ms. Milius and explained the above to her and she again requested that it be removed. I have placed the orders and let her know that she would get called by IR to set up the appointment. Ms. Sandford knows to call me if I can help her with anything in the future.  [name] RN, BSN, OCN Head & Neck Oncology Nurse Navigator [facility] Phone # [phone]  Fax # [phone]

## 2023-03-04 DIAGNOSIS — Z923 Personal history of irradiation: Secondary | ICD-10-CM | POA: Diagnosis not present

## 2023-03-04 DIAGNOSIS — C140 Malignant neoplasm of pharynx, unspecified: Secondary | ICD-10-CM | POA: Diagnosis not present

## 2023-03-04 DIAGNOSIS — R252 Cramp and spasm: Secondary | ICD-10-CM | POA: Diagnosis not present

## 2023-03-04 DIAGNOSIS — H9193 Unspecified hearing loss, bilateral: Secondary | ICD-10-CM | POA: Diagnosis not present

## 2023-03-04 DIAGNOSIS — Z9221 Personal history of antineoplastic chemotherapy: Secondary | ICD-10-CM | POA: Diagnosis not present

## 2023-03-05 ENCOUNTER — Ambulatory Visit (HOSPITAL_COMMUNITY)
Admission: RE | Admit: 2023-03-05 | Discharge: 2023-03-05 | Disposition: A | Discharge status: Home / Self Care | Payer: Medicare Other | Source: Ambulatory Visit | Attending: Hematology and Oncology | Admitting: Hematology and Oncology

## 2023-03-05 DIAGNOSIS — C770 Secondary and unspecified malignant neoplasm of lymph nodes of head, face and neck: Secondary | ICD-10-CM | POA: Insufficient documentation

## 2023-03-05 DIAGNOSIS — Z452 Encounter for adjustment and management of vascular access device: Secondary | ICD-10-CM | POA: Insufficient documentation

## 2023-03-05 HISTORY — PX: IR REMOVAL TUN ACCESS W/ PORT W/O FL MOD SED: IMG2290

## 2023-03-05 HISTORY — PX: IR GASTROSTOMY TUBE REMOVAL: IMG5492

## 2023-03-05 MED ORDER — LIDOCAINE-EPINEPHRINE 1 %-1:100000 IJ SOLN: 20.0000 mL | Freq: Once | INTRAMUSCULAR | Status: DC

## 2023-03-05 MED ORDER — LIDOCAINE-EPINEPHRINE 1 %-1:100000 IJ SOLN
INTRAMUSCULAR | Status: AC
  Filled 2023-03-05: qty 1

## 2023-03-05 MED ORDER — LIDOCAINE VISCOUS HCL 2 % MT SOLN
OROMUCOSAL | Status: AC
  Filled 2023-03-05: qty 15

## 2023-03-05 MED ORDER — LIDOCAINE HCL 1 % IJ SOLN
INTRAMUSCULAR | Status: AC
  Filled 2023-03-05: qty 20

## 2023-03-05 MED ORDER — LIDOCAINE HCL 1 % IJ SOLN
20.0000 mL | Freq: Once | INTRAMUSCULAR | Status: AC
  Administered 2023-03-05: 10 mL via INTRADERMAL

## 2023-03-05 NOTE — Procedures (Signed)
Vascular and Interventional Radiology Procedure Note  Patient: Nichole Dominguez DOB: 1955/12/23 Medical Record Number: [id number] Note Date/Time: 03/05/23 5:14 PM   Performing Physician: Roanna Banning, MD Assistant(s): None  Diagnosis: Head and Neck cancer  Procedure: PORT REMOVAL  Anesthesia: Local Anesthetic Complications: None Estimated Blood Loss: Minimal Specimens:  None  Findings:  Successful removal of a right-sided venous port. Primary incision closure. Dermabond at skin.  See detailed procedure note with images in PACS. The patient tolerated the procedure well without incident or complication and was returned to Recovery in stable condition.    Roanna Banning, MD [facility] Radiology   Pager. [phone] Clinic. [phone]

## 2023-03-20 ENCOUNTER — Ambulatory Visit: Payer: Medicare Other | Attending: Radiation Oncology

## 2023-03-20 DIAGNOSIS — R1310 Dysphagia, unspecified: Secondary | ICD-10-CM | POA: Diagnosis not present

## 2023-03-20 NOTE — Therapy (Signed)
OUTPATIENT SPEECH LANGUAGE PATHOLOGY ONCOLOGY TREATMENT/ Renewal-discharge   Patient Name: Nichole Dominguez MRN: [id number] DOB:03/29/1956, 67 y.o., female Today's Date: 03/20/2023  PCP: [name], MD REFERRING PROVIDER: Lonie Peak, MD  END OF SESSION:  End of Session - 03/20/23 0001     Visit Number 4    Number of Visits 7    Date for SLP Re-Evaluation 03/20/23    SLP Start Time 1320    SLP Stop Time  1345    SLP Time Calculation (min) 25 min    Activity Tolerance Patient tolerated treatment well               Past Medical History:  Diagnosis Date   Allergy    GERD (gastroesophageal reflux disease)    Melanoma (HCC) 12/25/2022   right posterior shoulder ([facility])   Osteopenia 01/04/2020   Plantar fasciitis    Seasonal allergies    Syncope and collapse 12/21/2022   Past Surgical History:  Procedure Laterality Date   COLONOSCOPY  2011   IR GASTROSTOMY TUBE MOD SED  08/21/2022   IR GASTROSTOMY TUBE REMOVAL  03/05/2023   IR IMAGING GUIDED PORT INSERTION  08/21/2022   IR PATIENT EVAL TECH 0-60 MINS  08/31/2022   IR PATIENT EVAL TECH 0-60 MINS  09/07/2022   IR REMOVAL TUN ACCESS W/ PORT W/O FL MOD SED  03/05/2023   MOUTH SURGERY  01/2020   implant inserted   TUBAL LIGATION  1992   Patient Active Problem List   Diagnosis Date Noted   GERD (gastroesophageal reflux disease) 01/29/2023   Orthostatic syncope 12/26/2022   Melanoma (HCC) 12/25/2022   Pancytopenia, acquired (HCC) 10/12/2022   Cancer associated pain 10/12/2022   Other constipation 10/12/2022   Hypomagnesemia 10/12/2022   Port-A-Cath in place 08/24/2022   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 Pt will be seen today with the LTGs enforced and then will be discharged. Pt appears safe today with regular diet items, and thin liquids. She is completing HEP without any difficulty or cues necessary.   Remaining deficits: None noted   Education / Equipment: See therapy notes for more details.    Patient agrees to discharge. Patient goals were partially met. Patient is being discharged due to being pleased with the current functional level..     ONSET DATE: See "pertinent hx" below    REFERRING DIAG: Secondary malignant neoplasm of cervical lymph node    THERAPY DIAG:  Dysphagia, unspecified type   Rationale for Evaluation and Treatment: Rehabilitation   SUBJECTIVE:    SUBJECTIVE STATEMENT: "The bread - it still balls up."  Pt accompanied by: self   PERTINENT HISTORY:  05/07/22 She saw Dr. Marene Lenz and laryngoscopy was performed which revealed a relatively indurated mass lesion extending from the mid parotid gland to level II in the neck measuring approx. 4 cm. Biopsy of the parotid mass was non diagnostic. 06/26/22 CT neck completed after she began to develop jaw pain and right neck pain. 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 The mass appeared to encase a portion of the right external carotid artery and partially encases the right internal carotid artery Prominent right-sided cervical and upper mediastinal lymph nodes were also appreciated, suspicious for metastatic spread of  disease. 07/17/22 She saw Dr. Christoper Allegra who did a Laryngoscopy performed during this visit was notable for mild right pharyngeal fullness, mild right vocal fold hypomobility, and limited  anterior movement of the right tongue. Based on CT findings, Dr. Christoper Allegra recommended proceeding with repeat IR guided biopsy of the mass, as well as staging work-up including an MRI of the neck and PET scan. 07/27/22 MRI of the face showed the infiltrative mass lesion centered in the right carotid, parapharyngeal and parotid spaces with extensive surrounding local invasion, and findings concerning for metastatic adenopathy in the right neck and additional nonspecific mildly enlarged and rounded lymph nodes involving the upper mediastinum and contralateral left jugular chain. MRI also showed evidence of tumor extension within close proximity to multiple skill base foramina, along with evidence of right hypoglossal nerve involvement contributing to right hemitongue denervation changes, and tumor thrombus within the encased right internal jugular vein. MRI otherwise showed no evidence of intracranial perineural tumor spread. 07/27/22 FNA of the right neck mass collected on 07/27/22 showed carcinoma with squamous features, favoring squamous cell carcinoma (however, other tumors with squamous differentiation cannot entirely be excluded); P16 diffusely positive; HPV focally positive. 08/02/22 PET demonstrated  the dominant right level IIa metastatic nodal mass measuring 4.9 x 2.5 cm with an SUV max of 15; multiple ipsilateral nodal metastases involving the right level 3, level 4, and level 5 lymph node stations including the dominant right level 3 nodal metastasis (measuring 1.1 cm) coming in contact with the right oropharynx without discrete primary tumor; FDG avidity within the left palatine tonsil (normal in size and with a max SUV of 4.4); and mildly FDG avid mediastinal, bihilar, and subcarinal lymph nodes. No other sites concerning for metastatic disease were appreciated elsewhere in the body. Given that there is encasement of the carotid, surgical resection was not advised by Dr. Christoper Allegra and  chemo/radiation was  recommended.08/10/22 Consult with Dr. Basilio Cairo & 08/17/22 Consult with Dr. Al Pimple. She will receive chemo/radiation. Treatment plan:  She will receive 35 fractions of radiation to her Oropharynx and bilateral neck with weekly cisplatin. She started radiation on 08/20/22 and will start chemo on 08/24/22 and will complete 10/08/22. Pretreatment procedures. 08/21/22 PEG/PAC   PAIN:  Are you having pain? No    FALLS: Has patient fallen in last 6 months?  No    PATIENT GOALS: Maintain WNL swallowing   OBJECTIVE:    TODAY'S TREATMENT:  DATE:  03/20/23: "I keep track of what I'm eating so I'm sure to get enough. I feel pretty close to pre-cancer." Pt ate peanut butter crackers and drank water without any overt s/sx of oral or pharyngeal deficits.  Pt told SLP she will perform HEP two days/week after January 2025. Her procedure for HEP was correct; She is independent. She is pleased with d/c today.   01/16/23: Egg noodles with cream of mushroom soup, and a cup of peas prior to coming today. She is still having trouble with dysgeusia/ageusia. She ate bites of peanut butter crackers and drank water without any overt s/sx pharyngeal or s/sx oral stage dysphagia.  With HEP, pt was independent. She is performing at least 5 days/week. SLP told pt no less than this to minimize risk of muscle fibrosis. She and SLP agreed pt could reduce to once every 8 weeks due to success with HEP and safety with POs.  12/18/22: SLP identified what looks to be lymphedema and told referring MD. She will send referral for PT.  Pt is having 2 eggs daily, and has one more "meal" daily - baked beans and other softer foods. Still has dysgeusia/ageusia. SLP told pt to focus on those foods she can tolerate. She still has PEG but is losing weight. SLP encouraged her to reach out to one of the oncology dieticians  about this.SLP provided pt a packet of high calorie high protein liquids/shake recipes.  SLP had to provide rare min A for HEP today. Pt is not completing scope as directed (doing effortful and masako but not shaker, Mendlsohn, and supraglottic). SLP reiterated to pt to complete ENTIRE HEP.  No overt s/sx pharyngeal deficits; Pt had extended mastication time very likely due to xerostomia. SLP provided handout and explanation of aspiration PNA. Pt demonstrated understanding. Decr to once every other month if pt's HEP procedure is WNL and she exhibits WNL swallowing safety.  Research states the risk for dysphagia increases due to radiation and/or chemotherapy treatment due to a variety of factors, so SLP re-educated pt regarding possible changes to swallowing musculature after rad tx, and why adherence to dysphagia HEP provided today and PO consumption was necessary to inhibit muscle fibrosis following rad tx and to mitigate muscle disuse atrophy. SLP informed pt why this would be detrimental to their swallowing status and to their pulmonary health. Pt demonstrated understanding of these things to SLP. SLP encouraged pt to safely eat and drink as deep into their radiation/chemotherapy as possible to provide the best possible long-term swallowing outcome for pt.   SLP then reviewed the individualized HEP for pt ithat was developed in June involving oral and pharyngeal strengthening and ROM and pt was instructed how to perform these exercises, including SLP demonstration. After SLP demonstration, pt return demonstrated each exercise. SLP ensured pt performance was correct prior to educating pt on next exercise. Pt required usual min-mod cues faded to modified independent to perform HEP. Pt was instructed to complete this program 6-7 days/week, at least 20 reps/day with performing no less than 5 reps at one time. Shaker x3 reps BID, until 6 months after the last day of rad tx, and then x2 a week after that,  indefinitely.     PATIENT EDUCATION: Education details: see "today's treatment" for details Person educated: Patient  Education method: Explanation, Demonstration, Verbal cues, and Handouts Education comprehension: verbalized understanding, returned demonstration, verbal cues required, and needs further education     ASSESSMENT:   CLINICAL IMPRESSION: Patient is a  67 y.o. F who was seen today for treatment of swallowing after completing radiation/chemoradiation therapy. POs: At this time pt swallowing is deemed WNL/WFL with most regular diet items and thin liquids. No oral or pharyngeal deficits were observed today. There are no overt s/s aspiration PNA observed by SLP nor any reported by pt at this time. Data indicate that pt's swallow ability could very well decline over time following the conclusion of chemorad therapy due to muscle disuse atrophy and/or muscle fibrosis. Pt agrees with d/c today.   OBJECTIVE IMPAIRMENTS: include dysphagia. These impairments are limiting patient from safety when swallowing. Factors affecting potential to achieve goals and functional outcome are  none noted today. Patient will benefit from skilled SLP services to address above impairments and improve overall function.   REHAB POTENTIAL: Good   GOALS: Goals reviewed with patient? No   SHORT TERM GOALS: Target: 3rd total session     1. Pt will complete HEP with modified independence in 2 sessions Baseline: 01/16/23 Goal status: partially met     2.  pt will tell SLP why pt is completing HEP with modified independence Baseline:  Goal status: met     3.  pt ill describe 3 overt s/s aspiration PNA with modified independence Baseline:  Goal status: met     4.  pt will tell SLP how a food journal may help return to a more normalized             diet Baseline:  Goal status: deferred to LTG     LONG TERM GOALS: Target: 7th total session   1.  pt will complete HEP with independence over two  visits Baseline: 01/16/23 Goal status: Met   2.  pt will describe how to modify HEP over time, and the timeline associated with reduction in HEP frequency with modified independence over two sessions Baseline:  Goal status: partially met  3.  pt will tell SLP how a food journal may help return to a more normalized             diet Baseline:  Goal status: deferred    PLAN:  d/c today.   PLANNED INTERVENTIONS: Aspiration precaution training, Pharyngeal strengthening exercises, Diet toleration management , Environmental controls, Trials of upgraded texture/liquids, Cueing hierachy, Internal/external aids, SLP instruction and feedback, Compensatory strategies, and Patient/family education   [facility], CCC-SLP 03/20/2023, 1:52 PM

## 2023-05-03 ENCOUNTER — Other Ambulatory Visit: Payer: Self-pay

## 2023-05-03 DIAGNOSIS — C770 Secondary and unspecified malignant neoplasm of lymph nodes of head, face and neck: Secondary | ICD-10-CM

## 2023-05-09 ENCOUNTER — Telehealth: Payer: Self-pay | Admitting: *Deleted

## 2023-05-09 NOTE — Telephone Encounter (Signed)
CALLED PATIENT TO INFORM OF CT FOR 05-17-23- ARRIVAL TIME- 4:45 PM @ [facility], PATIENT TO HAVE WATER ONLY- 4 HRS. PRIOR TO SCAN, PATIENT TO HAVE LAB AND FU ON 05-29-23 -LAB - 10:30 AM AND FU WITH DR. Basilio Cairo ON 05-29-23 @ 11 AM, SPOKE WITH PATIENT AND SHE IS AWARE OF THESE APPTS. AND THE INSTRUCTIONS

## 2023-05-17 ENCOUNTER — Ambulatory Visit (HOSPITAL_COMMUNITY)
Admission: RE | Admit: 2023-05-17 | Discharge: 2023-05-17 | Disposition: A | Discharge status: Home / Self Care | Payer: Medicare Other | Source: Ambulatory Visit | Attending: Radiation Oncology | Admitting: Radiation Oncology

## 2023-05-17 DIAGNOSIS — J432 Centrilobular emphysema: Secondary | ICD-10-CM | POA: Diagnosis not present

## 2023-05-17 DIAGNOSIS — E034 Atrophy of thyroid (acquired): Secondary | ICD-10-CM | POA: Diagnosis not present

## 2023-05-17 DIAGNOSIS — C770 Secondary and unspecified malignant neoplasm of lymph nodes of head, face and neck: Secondary | ICD-10-CM | POA: Diagnosis not present

## 2023-05-17 DIAGNOSIS — R918 Other nonspecific abnormal finding of lung field: Secondary | ICD-10-CM | POA: Diagnosis not present

## 2023-05-17 DIAGNOSIS — C760 Malignant neoplasm of head, face and neck: Secondary | ICD-10-CM | POA: Diagnosis not present

## 2023-05-17 MED ORDER — IOHEXOL 300 MG/ML  SOLN
75.0000 mL | Freq: Once | INTRAMUSCULAR | Status: AC | PRN
  Administered 2023-05-17: 75 mL via INTRAVENOUS

## 2023-05-22 DIAGNOSIS — K080 Exfoliation of teeth due to systemic causes: Secondary | ICD-10-CM | POA: Diagnosis not present

## 2023-05-27 NOTE — Progress Notes (Incomplete)
Radiation Oncology         [phone] ________________________________  Name: Nichole Dominguez MRN: [id number]  Date: 05/31/2023  DOB: January 29, 1956  Follow-Up Visit Note  CC: Nichole Craze, NP  Nichole Craze, NP  Diagnosis and Prior Radiotherapy:      ICD-10-CM   1. 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 She completed treatment on 10-08-22.   CT of the neck on 05/17/2023 demonstrated residual architectural distortion in the right neck that is grossly stable from PET on 01/08/2023. No evidence of recurrent disease was seen. CT of the chest performed on the same day showed a shrinking right lower lobe consolidation, possibly post-infectious/inflammatory in etiology. Metastasis was thought to be unlikely. No other evidence of metastatic disease was otherwise seen.   Pain issues, if any: Patient any throat pain. Throat is dry uses biotene. Using a feeding tube?: Feeding Tube removed in November. Weight changes, if any: Lost weight since treatment. She says her weight now is stable and stayed the same.  Eats one big meal and drinks ensure.  Swallowing issues, if any: Patient denies Smoking or chewing tobacco? Patient denies Using fluoride toothpaste daily? Patient is using fluoride toothpaste. Last ENT visit was on: November 2024 Other notable issues, if any: None  Wt Readings from Last 3 Encounters:   05/31/23 118 lb 6 oz (53.7 kg)  01/29/23 129 lb (58.5 kg)  01/11/23 128 lb 2 oz (58.1 kg)   BP 110/65 (BP Location: Left Arm, Patient Position: Sitting)  Pulse 77  Temp (!) 97.3 F (36.3 C) (Temporal)  Resp 18  Ht 5\' 1"  (1.549 m)  Wt 118 lb 6 oz (53.7 kg)  SpO2 99%  BMI 22.37 kg/m   ALLERGIES:  is allergic to amlodipine, darvon [propoxyphene], and sudafed [pseudoephedrine hcl].  Meds: Current Outpatient Medications  Medication Sig Dispense Refill   acetaminophen (TYLENOL) 500 MG tablet Take 1,500 mg by mouth every 6 (six) hours as needed.     Calcium Carbonate (CALCIUM 600 HIGH POTENCY PO) Take 3 capsules by mouth daily.     cholecalciferol (VITAMIN D3) 25 MCG (1000 UNIT) tablet Take 1,000 Units by mouth daily.     fluticasone (FLONASE) 50 MCG/ACT nasal spray Place 2 sprays into both nostrils daily. 48 g 3   Magnesium 500 MG CAPS Take 1 capsule (500 mg total) by mouth daily.     Multiple Vitamins-Minerals (MULTIVITAMIN WITH MINERALS) tablet Take 1 tablet by mouth daily.     omeprazole (PRILOSEC) 20 MG capsule Take 1 capsule (20 mg total) by mouth every morning. 90 capsule 1   augmented betamethasone dipropionate (DIPROLENE-AF) 0.05 % ointment Apply topically 2 (two) times daily. (Patient not taking: Reported on 05/31/2023) 15 g 0   No current facility-administered medications for this encounter.    Physical Findings: The patient is in no acute distress. Patient is alert and oriented. Wt Readings from Last 3  Encounters:  05/31/23 118 lb 6 oz (53.7 kg)  01/29/23 129 lb (58.5 kg)  01/11/23 128 lb 2 oz (58.1 kg)    height is 5\' 1"  (1.549 m) and weight is 118 lb 6 oz (53.7 kg). Her temporal temperature is 97.3 F (36.3 C) (abnormal). Her blood pressure is 110/65 and her pulse is 77. Her respiration is 18 and oxygen saturation is 99%. .  General: Alert and oriented, in no acute distress HEENT: Head is normocephalic. Extraocular movements are intact. Oropharynx is notable for no  mucositis, tumor, or thrush;  Neck: Neck is lymphedema in the anterior neck. No palpable adenopathy.  Skin: Skin in treatment fields shows  dry skin over neck Lymphatics: see Neck Exam Psychiatric: Judgment and insight are intact. Affect is appropriate. Abd: PEG tube present  Ext: No edema in extremities  Lab Findings: Lab Results  Component Value Date   WBC 6.5 12/25/2022   HGB 11.7 (L) 12/25/2022   HCT 34.7 (L) 12/25/2022   MCV 90.0 12/25/2022   PLT 346.0 12/25/2022    Lab Results  Component Value Date   TSH 3.02 12/19/2022    Radiographic Findings: CT Soft Tissue Neck W Contrast Result Date: 05/25/2023 CLINICAL DATA:  Head neck cancer monitoring. EXAM: CT NECK WITH CONTRAST TECHNIQUE: Multidetector CT imaging of the neck was performed using the standard protocol following the bolus administration of intravenous contrast. RADIATION DOSE REDUCTION: This exam was performed according to the departmental dose-optimization program which includes automated exposure control, adjustment of the mA and/or kV according to patient size and/or use of iterative reconstruction technique. CONTRAST:  75mL OMNIPAQUE IOHEXOL 300 MG/ML  SOLN COMPARISON:  01/08/2023 PET-CT.  06/26/2022 neck CT FINDINGS: Pharynx and larynx: No mucosal based mass is seen. Typical post treatment changes. Salivary glands: Post treatment changes especially affects the submandibular glands. Thyroid: Generalized atrophy. Lymph nodes: Low-density architectural distortion in the right neck where there was bulky and enhancing mass on prior anatomic scan. No change in the residual infiltrating density since PET CT 01/08/2023. No newly enlarged or heterogeneous node. Vascular: Malignant collapse of the right internal jugular vein superiorly. No interval finding Limited intracranial: Negative Visualized orbits: Partial coverage is negative Mastoids and visualized paranasal sinuses: Left posterior ethmoid and sphenoid sinus opacification.  Skeleton: No acute finding Upper chest: Biapical scarring attributed to radiotherapy. Centrilobular emphysema. Chest CT reported separately. IMPRESSION: First anatomic scan since treatment. Residual architectural distortion in the right neck is grossly stable from PET CT 01/08/2023. No evidence of recurrent disease. Electronically Signed   By: Tiburcio Pea M.D.   On: 05/25/2023 05:55   CT Chest W Contrast Result Date: 05/24/2023 CLINICAL DATA:  Head/neck cancer.  * Tracking Code: BO * EXAM: CT CHEST WITH CONTRAST TECHNIQUE: Multidetector CT imaging of the chest was performed during intravenous contrast administration. RADIATION DOSE REDUCTION: This exam was performed according to the departmental dose-optimization program which includes automated exposure control, adjustment of the mA and/or kV according to patient size and/or use of iterative reconstruction technique. CONTRAST:  75mL OMNIPAQUE IOHEXOL 300 MG/ML  SOLN COMPARISON:  PET 01/08/2023. FINDINGS: Cardiovascular: Heart size normal.  New trace pericardial effusion. Mediastinum/Nodes: No pathologically enlarged mediastinal, hilar or axillary lymph nodes. Esophagus is grossly unremarkable. Lungs/Pleura: Centrilobular and paraseptal emphysema. Post radiation ground-glass and mild architectural distortion in the apices. Decreased nodular consolidation in the posterolateral right lower lobe, now measuring 9 mm (6/123), previously 1.2 x 2.2 cm. No new pulmonary nodules. No pleural fluid. Airway is unremarkable. Upper  Abdomen: Gallstones. Visualized portions of the liver, gallbladder, adrenal glands, kidneys, spleen, pancreas, stomach and bowel are otherwise grossly unremarkable. No upper abdominal adenopathy. Musculoskeletal: Minimal degenerative change in the spine. IMPRESSION: 1. Shrinking right lower lobe consolidation, possibly postinfectious or postinflammatory in etiology. Difficult to exclude a metastasis but, in the absence of interval therapy, is  unlikely. 2. Otherwise, no evidence of metastatic disease. 3. Mild changes of radiation therapy in the lung apices. 4. Trace pericardial effusion. 5. Cholelithiasis. 6.  Emphysema (ICD10-J43.9). 7. CT neck dictated separately. Electronically Signed   By: Leanna Battles M.D.   On: 05/24/2023 16:08    Impression/Plan:    1) Head and Neck Cancer Status: We personally reviewed her imaging today, and her case was discussed at our tumor board earlier this week. She is in remission according to CT re-staging scans and physical exam.   2) Nutritional Status:  stable, eating a wide variety of foods. Meat still does not taste good to her.  Wt Readings from Last 3 Encounters:  05/31/23 118 lb 6 oz (53.7 kg)  01/29/23 129 lb (58.5 kg)  01/11/23 128 lb 2 oz (58.1 kg)  Feeding tube: removed   3) Risk Factors: The patient has been educated about risk factors including alcohol and tobacco abuse; they understand that avoidance of alcohol and tobacco is important to prevent recurrences as well as other cancers   4) Swallowing: Patient denies dysphagia.   5) Dental: Encouraged to continue regular followup with dentistry, and dental hygiene including fluoride rinses. She is using prescription strength fluoride toothpaste and is seeing a dentist regularly. Patient was recently seen by her dentist and asked about removal of tooth #19. Dr. Basilio Cairo has reviewed her treatment plans which show low levels of radiation (<45 Gy) to this tooth root. Patient was given a note explaining this today and our number if her dentist needs any other information from [place].   6) Thyroid function: Obtained today. Will call if results are abnormal.  Lab Results  Component Value Date   TSH 3.02 12/19/2022    7) Dehydration: labs today show signs of mild dehydration. Encouraged patient to push plenty of fluids and electrolytes to help with this.   8)Patient is scheduled to see Dr. Al Pimple in September of 2025. We will order imaging to  be completed for review before this visit. Patient will see Dr. Marene Lenz in May and [place] in 1 year. We will repeat TSH levels at that time.    On date of service, in total, I spent 20 minutes on this encounter. Patient was seen in person. _____________________________________    Bryan Lemma, PA-C

## 2023-05-29 ENCOUNTER — Ambulatory Visit: Payer: Medicare Other | Admitting: Radiation Oncology

## 2023-05-29 ENCOUNTER — Other Ambulatory Visit (HOSPITAL_COMMUNITY): Payer: Self-pay

## 2023-05-29 ENCOUNTER — Ambulatory Visit: Payer: Self-pay

## 2023-05-30 NOTE — Progress Notes (Incomplete)
Ms. Nichole Dominguez presents today for a follow up to receive CT scan results. She completed treatment for cancer of the right parapharyngeal space on 10/08/2022  CT Soft Tissue Neck W Contrast    CT Chest W Contrast  Pain issues, if any: Patient any throat pain. Throat is dry uses biotene. Using a feeding tube?: Feeding Tube removed in November. Weight changes, if any: Lost weight since treatment. She says her weight now is stable and stayed the same.  Eats one big meal and drinks ensure.  Swallowing issues, if any: Patient denies Smoking or chewing tobacco? Patient denies Using fluoride toothpaste daily? Patient is using fluoride toothpaste. Last ENT visit was on: November 2024 Other notable issues, if any: None  BP 110/65 (BP Location: Left Arm, Patient Position: Sitting)   Pulse 77   Temp (!) 97.3 F (36.3 C) (Temporal)   Resp 18   Ht 5\' 1"  (1.549 m)   Wt 118 lb 6 oz (53.7 kg)   SpO2 99%   BMI 22.37 kg/m   Wt Readings from Last 3 Encounters:  05/31/23 118 lb 6 oz (53.7 kg)  01/29/23 129 lb (58.5 kg)  01/11/23 128 lb 2 oz (58.1 kg)

## 2023-05-31 ENCOUNTER — Ambulatory Visit
Admission: RE | Admit: 2023-05-31 | Discharge: 2023-05-31 | Disposition: A | Discharge status: Home / Self Care | Payer: Medicare Other | Source: Ambulatory Visit | Attending: Radiation Oncology | Admitting: Radiation Oncology

## 2023-05-31 VITALS — BP 110/65 | HR 77 | Temp 97.3°F | Resp 18 | Ht 61.0 in | Wt 118.4 lb

## 2023-05-31 DIAGNOSIS — Z08 Encounter for follow-up examination after completed treatment for malignant neoplasm: Secondary | ICD-10-CM | POA: Diagnosis not present

## 2023-05-31 DIAGNOSIS — J432 Centrilobular emphysema: Secondary | ICD-10-CM | POA: Insufficient documentation

## 2023-05-31 DIAGNOSIS — I3139 Other pericardial effusion (noninflammatory): Secondary | ICD-10-CM | POA: Insufficient documentation

## 2023-05-31 DIAGNOSIS — C140 Malignant neoplasm of pharynx, unspecified: Secondary | ICD-10-CM

## 2023-05-31 DIAGNOSIS — Z8579 Personal history of other malignant neoplasms of lymphoid, hematopoietic and related tissues: Secondary | ICD-10-CM | POA: Diagnosis not present

## 2023-05-31 DIAGNOSIS — Z923 Personal history of irradiation: Secondary | ICD-10-CM | POA: Insufficient documentation

## 2023-05-31 DIAGNOSIS — C770 Secondary and unspecified malignant neoplasm of lymph nodes of head, face and neck: Secondary | ICD-10-CM | POA: Insufficient documentation

## 2023-05-31 DIAGNOSIS — E860 Dehydration: Secondary | ICD-10-CM | POA: Insufficient documentation

## 2023-05-31 DIAGNOSIS — K8020 Calculus of gallbladder without cholecystitis without obstruction: Secondary | ICD-10-CM | POA: Insufficient documentation

## 2023-05-31 DIAGNOSIS — C109 Malignant neoplasm of oropharynx, unspecified: Secondary | ICD-10-CM | POA: Insufficient documentation

## 2023-05-31 DIAGNOSIS — Z79899 Other long term (current) drug therapy: Secondary | ICD-10-CM | POA: Insufficient documentation

## 2023-05-31 LAB — MAGNESIUM: Magnesium: 1.8 mg/dL (ref 1.7–2.4)

## 2023-05-31 LAB — BASIC METABOLIC PANEL - CANCER CENTER ONLY
Anion gap: 4 — ABNORMAL LOW (ref 5–15)
BUN: 22 mg/dL (ref 8–23)
CO2: 33 mmol/L — ABNORMAL HIGH (ref 22–32)
Calcium: 9.5 mg/dL (ref 8.9–10.3)
Chloride: 101 mmol/L (ref 98–111)
Creatinine: 1.04 mg/dL — ABNORMAL HIGH (ref 0.44–1.00)
GFR, Estimated: 60 mL/min — ABNORMAL LOW (ref >60)
Glucose, Bld: 103 mg/dL — ABNORMAL HIGH (ref 70–99)
Potassium: 3.6 mmol/L (ref 3.5–5.1)
Sodium: 138 mmol/L (ref 135–145)

## 2023-05-31 LAB — TSH: TSH: 4.132 u[IU]/mL (ref 0.350–4.500)

## 2023-05-31 NOTE — Addendum Note (Signed)
Encounter addended by: Erven Colla, PA-C on: 05/31/2023 4:51 PM  Actions taken: Clinical Note Signed

## 2023-06-03 ENCOUNTER — Other Ambulatory Visit: Payer: Self-pay

## 2023-06-03 DIAGNOSIS — C770 Secondary and unspecified malignant neoplasm of lymph nodes of head, face and neck: Secondary | ICD-10-CM

## 2023-06-03 NOTE — Progress Notes (Signed)
 Oncology Nurse Navigator Documentation   Per patient's 05/31/23 post-treatment follow-up with Dr. Basilio Cairo, sent fax to [facility] Scheduling with request Ms. Schrack be contacted and scheduled for routine post-RT follow-up with Dr. Marene Lenz in 3 months.  Notification of successful fax transmission received.   [name] RN, BSN, OCN Head & Neck Oncology Nurse Navigator [facility] Phone # [phone]  Fax # [phone]

## 2023-06-23 ENCOUNTER — Other Ambulatory Visit: Payer: Self-pay | Admitting: Family

## 2023-07-17 DIAGNOSIS — K080 Exfoliation of teeth due to systemic causes: Secondary | ICD-10-CM | POA: Diagnosis not present

## 2023-08-08 DIAGNOSIS — Z08 Encounter for follow-up examination after completed treatment for malignant neoplasm: Secondary | ICD-10-CM | POA: Diagnosis not present

## 2023-08-08 DIAGNOSIS — Z9221 Personal history of antineoplastic chemotherapy: Secondary | ICD-10-CM | POA: Diagnosis not present

## 2023-08-08 DIAGNOSIS — Z923 Personal history of irradiation: Secondary | ICD-10-CM | POA: Diagnosis not present

## 2023-08-08 DIAGNOSIS — Z85818 Personal history of malignant neoplasm of other sites of lip, oral cavity, and pharynx: Secondary | ICD-10-CM | POA: Diagnosis not present

## 2023-08-27 DIAGNOSIS — L819 Disorder of pigmentation, unspecified: Secondary | ICD-10-CM | POA: Diagnosis not present

## 2023-08-27 DIAGNOSIS — L820 Inflamed seborrheic keratosis: Secondary | ICD-10-CM | POA: Diagnosis not present

## 2023-08-27 DIAGNOSIS — D485 Neoplasm of uncertain behavior of skin: Secondary | ICD-10-CM | POA: Diagnosis not present

## 2023-08-27 DIAGNOSIS — D2372 Other benign neoplasm of skin of left lower limb, including hip: Secondary | ICD-10-CM | POA: Diagnosis not present

## 2023-08-27 DIAGNOSIS — Z85820 Personal history of malignant melanoma of skin: Secondary | ICD-10-CM | POA: Diagnosis not present

## 2023-08-27 DIAGNOSIS — L570 Actinic keratosis: Secondary | ICD-10-CM | POA: Diagnosis not present

## 2023-08-27 DIAGNOSIS — L821 Other seborrheic keratosis: Secondary | ICD-10-CM | POA: Diagnosis not present

## 2023-08-27 DIAGNOSIS — L814 Other melanin hyperpigmentation: Secondary | ICD-10-CM | POA: Diagnosis not present

## 2023-09-13 ENCOUNTER — Telehealth: Payer: Self-pay | Admitting: Family

## 2023-09-13 NOTE — Telephone Encounter (Signed)
 Copied from CRM [id number]. Topic: Medicare AWV >> Sep 13, 2023  1:25 PM Juliana Ocean wrote: Reason for CRM: LVM 09/12/2023 to schedule AWV. Please schedule Virtual or Telehealth visits ONLY  Rosalee Collins; Care Guide Ambulatory Clinical Support [place] [facility] Direct Dial: [phone]

## 2023-10-03 NOTE — Progress Notes (Signed)
 [facility] Quality Team Note  Name: Nichole Dominguez Date of Birth: 01-16-1956 MRN: [id number] Date: 10/03/2023  [facility] Quality Team has reviewed this patient's chart, please see recommendations below:  [facility] Quality Other; (CHART REVIEWED FOR CONTROLLING BLOOD PRESSURE GAP CLOSURE. ABSTRACTED.)

## 2023-10-16 DIAGNOSIS — K080 Exfoliation of teeth due to systemic causes: Secondary | ICD-10-CM | POA: Diagnosis not present

## 2023-11-15 ENCOUNTER — Encounter: Payer: Self-pay | Admitting: Hematology and Oncology

## 2023-12-04 DIAGNOSIS — K080 Exfoliation of teeth due to systemic causes: Secondary | ICD-10-CM | POA: Diagnosis not present

## 2023-12-17 DIAGNOSIS — K080 Exfoliation of teeth due to systemic causes: Secondary | ICD-10-CM | POA: Diagnosis not present

## 2023-12-19 ENCOUNTER — Encounter: Payer: Self-pay | Admitting: Family

## 2023-12-19 MED ORDER — FLUTICASONE PROPIONATE 50 MCG/ACT NA SUSP: 2.0000 | Freq: Every day | NASAL | 3 refills | Status: AC

## 2023-12-24 ENCOUNTER — Encounter: Admitting: Family

## 2023-12-24 ENCOUNTER — Ambulatory Visit (HOSPITAL_COMMUNITY)
Admission: RE | Admit: 2023-12-24 | Discharge: 2023-12-24 | Disposition: A | Discharge status: Home / Self Care | Source: Ambulatory Visit | Attending: Hematology and Oncology | Admitting: Hematology and Oncology

## 2023-12-24 ENCOUNTER — Encounter (HOSPITAL_COMMUNITY): Payer: Self-pay

## 2023-12-24 DIAGNOSIS — K449 Diaphragmatic hernia without obstruction or gangrene: Secondary | ICD-10-CM | POA: Diagnosis not present

## 2023-12-24 DIAGNOSIS — J439 Emphysema, unspecified: Secondary | ICD-10-CM | POA: Diagnosis not present

## 2023-12-24 DIAGNOSIS — C770 Secondary and unspecified malignant neoplasm of lymph nodes of head, face and neck: Secondary | ICD-10-CM | POA: Insufficient documentation

## 2023-12-24 DIAGNOSIS — C760 Malignant neoplasm of head, face and neck: Secondary | ICD-10-CM | POA: Diagnosis not present

## 2023-12-24 DIAGNOSIS — J949 Pleural condition, unspecified: Secondary | ICD-10-CM | POA: Diagnosis not present

## 2023-12-24 MED ORDER — IOHEXOL 300 MG/ML  SOLN
75.0000 mL | Freq: Once | INTRAMUSCULAR | Status: AC | PRN
  Administered 2023-12-24: 75 mL via INTRAVENOUS

## 2023-12-25 ENCOUNTER — Encounter: Payer: Self-pay | Admitting: Hematology and Oncology

## 2024-01-01 ENCOUNTER — Other Ambulatory Visit (HOSPITAL_BASED_OUTPATIENT_CLINIC_OR_DEPARTMENT_OTHER): Payer: Self-pay

## 2024-01-01 ENCOUNTER — Encounter: Payer: Self-pay | Admitting: Family

## 2024-01-01 ENCOUNTER — Ambulatory Visit: Admitting: Family

## 2024-01-01 VITALS — BP 116/66 | HR 72 | Temp 98.1°F | Resp 16 | Ht 61.0 in | Wt 116.4 lb

## 2024-01-01 DIAGNOSIS — C439 Malignant melanoma of skin, unspecified: Secondary | ICD-10-CM | POA: Diagnosis not present

## 2024-01-01 DIAGNOSIS — D649 Anemia, unspecified: Secondary | ICD-10-CM | POA: Diagnosis not present

## 2024-01-01 DIAGNOSIS — E785 Hyperlipidemia, unspecified: Secondary | ICD-10-CM | POA: Diagnosis not present

## 2024-01-01 DIAGNOSIS — I10 Essential (primary) hypertension: Secondary | ICD-10-CM | POA: Diagnosis not present

## 2024-01-01 DIAGNOSIS — Z23 Encounter for immunization: Secondary | ICD-10-CM | POA: Diagnosis not present

## 2024-01-01 DIAGNOSIS — K219 Gastro-esophageal reflux disease without esophagitis: Secondary | ICD-10-CM

## 2024-01-01 DIAGNOSIS — Z1159 Encounter for screening for other viral diseases: Secondary | ICD-10-CM

## 2024-01-01 DIAGNOSIS — Z0000 Encounter for general adult medical examination without abnormal findings: Secondary | ICD-10-CM

## 2024-01-01 DIAGNOSIS — Z780 Asymptomatic menopausal state: Secondary | ICD-10-CM | POA: Diagnosis not present

## 2024-01-01 DIAGNOSIS — M8580 Other specified disorders of bone density and structure, unspecified site: Secondary | ICD-10-CM

## 2024-01-01 LAB — CBC WITH DIFFERENTIAL/PLATELET
Basophils Absolute: 0 K/uL (ref 0.0–0.1)
Basophils Relative: 1.1 % (ref 0.0–3.0)
Eosinophils Absolute: 0.1 K/uL (ref 0.0–0.7)
Eosinophils Relative: 1.6 % (ref 0.0–5.0)
HCT: 41.4 % (ref 36.0–46.0)
Hemoglobin: 14.1 g/dL (ref 12.0–15.0)
Lymphocytes Relative: 24.4 % (ref 12.0–46.0)
Lymphs Abs: 1 K/uL (ref 0.7–4.0)
MCHC: 34 g/dL (ref 30.0–36.0)
MCV: 88.4 fl (ref 78.0–100.0)
Monocytes Absolute: 0.3 K/uL (ref 0.1–1.0)
Monocytes Relative: 7.6 % (ref 3.0–12.0)
Neutro Abs: 2.8 K/uL (ref 1.4–7.7)
Neutrophils Relative %: 65.3 % (ref 43.0–77.0)
Platelets: 326 K/uL (ref 150.0–400.0)
RBC: 4.68 Mil/uL (ref 3.87–5.11)
RDW: 13.1 % (ref 11.5–15.5)
WBC: 4.2 K/uL (ref 4.0–10.5)

## 2024-01-01 LAB — LIPID PANEL
Cholesterol: 272 mg/dL — ABNORMAL HIGH (ref 0–200)
HDL: 69.6 mg/dL (ref >39.00)
LDL Cholesterol: 173 mg/dL — ABNORMAL HIGH (ref 0–99)
NonHDL: 202.89
Total CHOL/HDL Ratio: 4
Triglycerides: 149 mg/dL (ref 0.0–149.0)
VLDL: 29.8 mg/dL (ref 0.0–40.0)

## 2024-01-01 LAB — COMPREHENSIVE METABOLIC PANEL WITH GFR
ALT: 18 U/L (ref 0–35)
AST: 11 U/L (ref 0–37)
Albumin: 4.6 g/dL (ref 3.5–5.2)
Alkaline Phosphatase: 74 U/L (ref 39–117)
BUN: 19 mg/dL (ref 6–23)
CO2: 33 meq/L — ABNORMAL HIGH (ref 19–32)
Calcium: 10.2 mg/dL (ref 8.4–10.5)
Chloride: 99 meq/L (ref 96–112)
Creatinine, Ser: 1.07 mg/dL (ref 0.40–1.20)
GFR: 54.27 mL/min — ABNORMAL LOW (ref >60.00)
Glucose, Bld: 92 mg/dL (ref 70–99)
Potassium: 4.5 meq/L (ref 3.5–5.1)
Sodium: 140 meq/L (ref 135–145)
Total Bilirubin: 0.5 mg/dL (ref 0.2–1.2)
Total Protein: 7 g/dL (ref 6.0–8.3)

## 2024-01-01 LAB — MAGNESIUM: Magnesium: 2.2 mg/dL (ref 1.5–2.5)

## 2024-01-01 MED ORDER — COVID-19 MRNA VAC-TRIS(PFIZER) 30 MCG/0.3ML IM SUSY
0.3000 mL | PREFILLED_SYRINGE | Freq: Once | INTRAMUSCULAR | 0 refills | Status: AC
  Filled 2024-01-01: qty 0.3, 1d supply, fill #0

## 2024-01-01 NOTE — Assessment & Plan Note (Signed)
 Stable without medication.   BP Readings from Last 3 Encounters:  01/01/24 116/66  05/31/23 110/65  01/29/23 (!) 114/57

## 2024-01-01 NOTE — Assessment & Plan Note (Signed)
Stable with omeprazole

## 2024-01-01 NOTE — Assessment & Plan Note (Signed)
Continues surveillance with dermatology.

## 2024-01-01 NOTE — Assessment & Plan Note (Signed)
  Routine adult wellness visit with no acute concerns. - Administer flu shot. - Recommend COVID-19 vaccination. - Encourage calorie-dense foods to increase caloric intake. - Encourage resumption of regular exercise routine.

## 2024-01-01 NOTE — Patient Instructions (Signed)
 VISIT SUMMARY:  Today, you had your annual physical exam and oncology surveillance. We discussed your recent weight changes, swallowing difficulties, dry mouth, and other health concerns. You received a flu shot and we talked about your exercise routine and dietary needs.  YOUR PLAN:  ADULT WELLNESS VISIT: Routine adult wellness visit with no acute concerns. -You received a flu shot today. -I recommend getting the COVID-19 vaccination. -Try to eat more calorie-dense foods to increase your caloric intake. -Resume your regular exercise routine at the gym.  DYSPHAGIA AND RIGHT TONGUE WEAKNESS: Swallowing difficulties and right tongue weakness following cancer treatment. -Continue using mouth moistening products as needed.  XEROSTOMIA: Persistent dry mouth, especially at night. -Continue using mouth moistening products, especially at night.  JAW BONE LOSS AND OSTEOPENIA: Jaw bone loss likely due to cancer treatment and presence of osteopenia. -We will order a bone density scan to monitor your bone health.  ANEMIA: Anemia likely related to previous cancer treatment. -We will order a blood count to monitor your anemia.  HEARING LOSS, RIGHT SIDE: Hearing loss on the right side, possibly related to previous treatments. -If you are interested, we can arrange a formal hearing test.  GASTROESOPHAGEAL REFLUX DISEASE (GERD): GERD managed with omeprazole , which is effective in controlling symptoms. -Continue taking omeprazole  as prescribed.  HYPERLIPIDEMIA: High cholesterol levels, last checked in 2021. -We will order a lipid panel to check your cholesterol levels.  CONSTIPATION: Ongoing issue with constipation. -We will continue to monitor and manage this issue as needed.

## 2024-01-01 NOTE — Progress Notes (Signed)
 Subjective:     Patient ID: Nichole Dominguez, female    DOB: 09-22-1955, 68 y.o.   MRN: [id number]  Chief Complaint  Patient presents with   Annual Exam    HPI  Discussed the use of AI scribe software for clinical note transcription with the patient, who gave verbal consent to proceed.  History of Present Illness  Nichole Dominguez is a 68 year old female who presents for an annual physical exam and oncology surveillance.  She recently underwent a CT scan and is scheduled to see her oncologist tomorrow. She has experienced significant weight loss due to cancer treatments, with swallowing difficulties. Her appetite has returned, and she is consuming about 1000 calories a day. Her weight has increased to 116 pounds from a low of 111 pounds. She avoids bread due to swallowing difficulties but can eat boiled and mashed potatoes. She experiences dry mouth, especially at night, and uses 'All Day' for relief. Bone loss in her jaw, attributed to cancer treatments, has been managed with bone grafts during dental procedures.  She joined a gym and was attending three times a week, using the rowing machine and treadmill, but has not been recently due to spending time at the beach. She plans to resume her routine soon.  She takes omeprazole  for reflux, which is helpful. She experiences occasional sinus issues and takes Benadryl  for relief. She reports some hearing loss, particularly on the right side, but has adapted and does not feel the need for hearing aids.  She has been anemic in the past, likely due to treatments, and takes magnesium  supplements. She is interested in checking her magnesium  levels to see if she can stop the supplements.  Immunizations:  Flu shot today Diet: appetite is improving.  Exercise:  joined the gym, enjoys walking/rowing- plans to start back Colonoscopy: 2021 due 2028 Dexa: due Pap Smear: aged out Mammogram: due in October Vision: up to date Dental: up to  date      Health Maintenance Due  Topic Date Due   Medicare Annual Wellness (AWV)  Never done   Hepatitis C Screening  Never done   COVID-19 Vaccine (6 - Pfizer risk 2024-25 season) 12/09/2023   Mammogram  01/30/2024    Past Medical History:  Diagnosis Date   Allergy    GERD (gastroesophageal reflux disease)    Melanoma (HCC) 12/25/2022   right posterior shoulder ([facility])   Orthostatic syncope 12/26/2022   Osteopenia 01/04/2020   Plantar fasciitis    Seasonal allergies    Syncope and collapse 12/21/2022    Past Surgical History:  Procedure Laterality Date   COLONOSCOPY  2011   IR GASTROSTOMY TUBE MOD SED  08/21/2022   IR GASTROSTOMY TUBE REMOVAL  03/05/2023   IR IMAGING GUIDED PORT INSERTION  08/21/2022   IR PATIENT EVAL TECH 0-60 MINS  08/31/2022   IR PATIENT EVAL TECH 0-60 MINS  09/07/2022   IR REMOVAL TUN ACCESS W/ PORT W/O FL MOD SED  03/05/2023   MOUTH SURGERY  01/2020   implant inserted   TUBAL LIGATION  1992    Family History  Problem Relation Age of Onset   Diabetes Mother    Heart disease Mother        chf   Hypertension Mother    Cervical cancer Mother    Obesity Mother    Colon polyps Mother    Thyroid  cancer Father    Cancer Father        thyroid  x2  Heart attack Father    Breast cancer Sister    Lupus Sister    Obesity Maternal Grandmother    Diabetes Maternal Grandmother    Heart disease Maternal Grandmother    Hypertension Maternal Grandmother    Stroke Maternal Grandmother    Diabetes Mellitus II Paternal Grandmother        died from diabetic coma   Cancer Paternal Grandfather        not sure what type   Heart attack Son    Esophageal cancer Neg Hx    Stomach cancer Neg Hx    Rectal cancer Neg Hx    Colon cancer Neg Hx     Social History   Socioeconomic History   Marital status: Married    Spouse name: Not on file   Number of children: Not on file   Years of education: Not on file   Highest education level: Some  college, no degree  Occupational History   Not on file  Tobacco Use   Smoking status: Former    Current packs/day: 0.00    Average packs/day: 1 pack/day for 24.0 years (24.0 ttl pk-yrs)    Types: Cigarettes    Start date: 10/10/1972    Quit date: 10/10/1996    Years since quitting: 27.2   Smokeless tobacco: Never  Vaping Use   Vaping status: Never Used  Substance and Sexual Activity   Alcohol use: Yes    Alcohol/week: 0.0 standard drinks of alcohol    Comment: 1 shot vodka daily   Drug use: No   Sexual activity: Yes    Partners: Male  Other Topics Concern   Not on file  Social History Narrative   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    Lives with husband   1 daughter in [place], 35 grandchildren   Son died   2 grown stepchildren- both Designer, multimedia, grandchildren   Enjoys Corporate investment banker   Social Drivers of Corporate investment banker Strain: Low Risk  (12/25/2023)   Overall Financial Resource Strain (CARDIA)    Difficulty of Paying Living Expenses: Not hard at all  Food Insecurity: No Food Insecurity (12/25/2023)   Hunger Vital Sign    Worried About Running Out of Food in the Last Year: Never true    Ran Out of Food in the Last Year: Never true  Transportation Needs: No Transportation Needs (12/25/2023)   PRAPARE - Administrator, Civil Service (Medical): No    Lack of Transportation (Non-Medical): No  Physical Activity: Sufficiently Active (12/25/2023)   Exercise Vital Sign    Days of Exercise per Week: 3 days    Minutes of Exercise per Session: 60 min  Stress: No Stress Concern Present (12/25/2023)   [employer] of Occupational Health - Occupational Stress Questionnaire    Feeling of Stress: Not at all  Social Connections: Moderately Integrated (12/25/2023)   Social Connection and Isolation Panel    Frequency of Communication with Friends and Family: Twice a week    Frequency of Social Gatherings with Friends and Family: Three times a week     Attends Religious Services: 1 to 4 times per year    Active Member of Clubs or Organizations: No    Attends Banker Meetings: Not on file    Marital Status: Married  Intimate Partner Violence: Not At Risk (08/09/2022)   Humiliation, Afraid, Rape, and Kick questionnaire    Fear of Current or Ex-Partner: No    Emotionally  Abused: No    Physically Abused: No    Sexually Abused: No    Outpatient Medications Prior to Visit  Medication Sig Dispense Refill   ascorbic acid (VITAMIN C) 500 MG tablet      Calcium Carbonate (CALCIUM 600 HIGH POTENCY PO) Take 3 capsules by mouth daily.     cholecalciferol (VITAMIN D3) 25 MCG (1000 UNIT) tablet Take 1,000 Units by mouth daily.     fluticasone  (FLONASE ) 50 MCG/ACT nasal spray Place 2 sprays into both nostrils daily. 48 g 3   Magnesium  500 MG CAPS Take 1 capsule (500 mg total) by mouth daily.     Multiple Vitamins-Minerals (MULTIVITAMIN WITH MINERALS) tablet Take 1 tablet by mouth daily.     omeprazole  (PRILOSEC) 20 MG capsule TAKE 1 CAPSULE EVERY MORNING 90 capsule 3   Zinc 50 MG TABS      acetaminophen  (TYLENOL ) 500 MG tablet Take 1,500 mg by mouth every 6 (six) hours as needed.     augmented betamethasone  dipropionate (DIPROLENE -AF) 0.05 % ointment Apply topically 2 (two) times daily. (Patient not taking: Reported on 05/31/2023) 15 g 0   No facility-administered medications prior to visit.    Allergies  Allergen Reactions   Amlodipine      swelling   Darvon [Propoxyphene] Rash   Sudafed [Pseudoephedrine Hcl]     rash    Review of Systems  Constitutional:  Positive for weight loss.  HENT:  Positive for congestion and hearing loss.   Eyes:  Negative for blurred vision.  Respiratory:  Negative for cough.   Cardiovascular:  Negative for leg swelling.  Gastrointestinal:  Negative for constipation and diarrhea.  Genitourinary:  Negative for dysuria and frequency.  Musculoskeletal:  Negative for joint pain and myalgias.  Skin:   Negative for rash.  Neurological:  Negative for headaches.  Psychiatric/Behavioral:  Negative for depression. The patient is not nervous/anxious.        Objective:    Physical Exam   BP 116/66 (BP Location: Right Arm, Patient Position: Sitting, Cuff Size: Small)   Pulse 72   Temp 98.1 F (36.7 C) (Oral)   Resp 16   Ht 5' 1 (1.549 m)   Wt 116 lb 6.4 oz (52.8 kg)   SpO2 99%   BMI 21.99 kg/m  Wt Readings from Last 3 Encounters:  01/01/24 116 lb 6.4 oz (52.8 kg)  05/31/23 118 lb 6 oz (53.7 kg)  01/29/23 129 lb (58.5 kg)   Physical Exam  Constitutional: She is oriented to person, place, and time. She appears well-developed and well-nourished. No distress.  HENT:  Head: Normocephalic and atraumatic.  Right Ear: Tympanic membrane and ear canal normal.  Left Ear: Tympanic membrane and ear canal normal.  Mouth/Throat: Oropharynx is clear and moist.  Eyes: Pupils are equal, round, and reactive to light. No scleral icterus.  Neck: Normal range of motion. No thyromegaly present.  Cardiovascular: Normal rate and regular rhythm.   No murmur heard. Pulmonary/Chest: Effort normal and breath sounds normal. No respiratory distress. He has no wheezes. She has no rales. She exhibits no tenderness.  Abdominal: Soft. Bowel sounds are normal. She exhibits no distension and no mass. There is no tenderness. There is no rebound and no guarding.  Musculoskeletal: She exhibits no edema.  Lymphadenopathy:    She has no cervical adenopathy.  Neurological: She is alert and oriented to person, place, and time. She has normal patellar reflexes. She exhibits normal muscle tone. Coordination normal.  Skin: Skin is warm  and dry.  Psychiatric: She has a normal mood and affect. Her behavior is normal. Judgment and thought content normal.  Breast/Pelvic: deferred          Assessment & Plan:       Assessment & Plan:   Problem List Items Addressed This Visit       Unprioritized   Routine  general medical examination at a health care facility - Primary    Routine adult wellness visit with no acute concerns. - Administer flu shot. - Recommend COVID-19 vaccination. - Encourage calorie-dense foods to increase caloric intake. - Encourage resumption of regular exercise routine.      Osteopenia   Relevant Orders   MM 3D SCREENING MAMMOGRAM BILATERAL BREAST   Melanoma (HCC)   Continues surveillance with dermatology.       Hypertension   Stable without medication.   BP Readings from Last 3 Encounters:  01/01/24 116/66  05/31/23 110/65  01/29/23 (!) 114/57         Relevant Orders   Comp Met (CMET)   GERD (gastroesophageal reflux disease)   Stable with omeprazole .       Other Visit Diagnoses       Needs flu shot       Relevant Orders   Flu vaccine HIGH DOSE PF(Fluzone Trivalent) (Completed)     Postmenopausal estrogen deficiency       Relevant Orders   DG Bone Density     Encounter for hepatitis C screening test for low risk patient       Relevant Orders   Hepatitis C Antibody     Hypomagnesemia       Relevant Orders   Comp Met (CMET)   Magnesium      Anemia, unspecified type       Relevant Orders   CBC w/Diff     Hyperlipidemia, unspecified hyperlipidemia type       Relevant Orders   Lipid panel       I have discontinued Niels Murillo's augmented betamethasone  dipropionate and acetaminophen . I am also having her maintain her multivitamin with minerals, Calcium Carbonate (CALCIUM 600 HIGH POTENCY PO), cholecalciferol, Magnesium , omeprazole , fluticasone , ascorbic acid, and Zinc.  No orders of the defined types were placed in this encounter.

## 2024-01-02 ENCOUNTER — Inpatient Hospital Stay: Payer: Medicare Other | Attending: Hematology and Oncology | Admitting: Hematology and Oncology

## 2024-01-02 ENCOUNTER — Encounter: Payer: Self-pay | Admitting: Family

## 2024-01-02 ENCOUNTER — Inpatient Hospital Stay

## 2024-01-02 VITALS — BP 146/88 | HR 74 | Temp 98.0°F | Resp 16 | Wt 117.0 lb

## 2024-01-02 DIAGNOSIS — Z833 Family history of diabetes mellitus: Secondary | ICD-10-CM | POA: Insufficient documentation

## 2024-01-02 DIAGNOSIS — Z8049 Family history of malignant neoplasm of other genital organs: Secondary | ICD-10-CM | POA: Insufficient documentation

## 2024-01-02 DIAGNOSIS — C770 Secondary and unspecified malignant neoplasm of lymph nodes of head, face and neck: Secondary | ICD-10-CM

## 2024-01-02 DIAGNOSIS — Z8269 Family history of other diseases of the musculoskeletal system and connective tissue: Secondary | ICD-10-CM | POA: Insufficient documentation

## 2024-01-02 DIAGNOSIS — I10 Essential (primary) hypertension: Secondary | ICD-10-CM | POA: Insufficient documentation

## 2024-01-02 DIAGNOSIS — Z634 Disappearance and death of family member: Secondary | ICD-10-CM | POA: Diagnosis not present

## 2024-01-02 DIAGNOSIS — Z8249 Family history of ischemic heart disease and other diseases of the circulatory system: Secondary | ICD-10-CM | POA: Diagnosis not present

## 2024-01-02 DIAGNOSIS — E785 Hyperlipidemia, unspecified: Secondary | ICD-10-CM | POA: Diagnosis not present

## 2024-01-02 DIAGNOSIS — Z79899 Other long term (current) drug therapy: Secondary | ICD-10-CM | POA: Diagnosis not present

## 2024-01-02 DIAGNOSIS — Z923 Personal history of irradiation: Secondary | ICD-10-CM | POA: Diagnosis not present

## 2024-01-02 DIAGNOSIS — Z803 Family history of malignant neoplasm of breast: Secondary | ICD-10-CM | POA: Insufficient documentation

## 2024-01-02 DIAGNOSIS — Z83719 Family history of colon polyps, unspecified: Secondary | ICD-10-CM | POA: Diagnosis not present

## 2024-01-02 DIAGNOSIS — Z87891 Personal history of nicotine dependence: Secondary | ICD-10-CM | POA: Diagnosis not present

## 2024-01-02 DIAGNOSIS — M8580 Other specified disorders of bone density and structure, unspecified site: Secondary | ICD-10-CM | POA: Diagnosis not present

## 2024-01-02 DIAGNOSIS — Z8349 Family history of other endocrine, nutritional and metabolic diseases: Secondary | ICD-10-CM | POA: Diagnosis not present

## 2024-01-02 DIAGNOSIS — Z808 Family history of malignant neoplasm of other organs or systems: Secondary | ICD-10-CM | POA: Insufficient documentation

## 2024-01-02 DIAGNOSIS — Z8589 Personal history of malignant neoplasm of other organs and systems: Secondary | ICD-10-CM | POA: Insufficient documentation

## 2024-01-02 DIAGNOSIS — Z823 Family history of stroke: Secondary | ICD-10-CM | POA: Insufficient documentation

## 2024-01-02 LAB — TSH: TSH: 5.7 u[IU]/mL — ABNORMAL HIGH (ref 0.350–4.500)

## 2024-01-02 LAB — HEPATITIS C ANTIBODY: Hepatitis C Ab: NONREACTIVE

## 2024-01-02 NOTE — Progress Notes (Signed)
 [facility] Cancer Follow up:    Daryl Setter, NP [address] [place] [address]   DIAGNOSIS:  Cancer Staging  No matching staging information was found for the patient.   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 The mass appeared to encase a portion of the right external carotid artery and partially encases the right internal carotid artery Prominent right-sided cervical and upper mediastinal  lymph nodes were also appreciated, suspicious for metastatic spread of  disease    07/24/2022 Imaging   MRI of the face and neck  showed the infiltrative mass lesion centered in the right carotid, parapharyngeal and parotid spaces with extensive surrounding local invasion, and findings concerning for metastatic adenopathy in the right neck and additional nonspecific mildly enlarged and rounded lymph nodes involving the upper mediastinum and contralateral left jugular chain. MRI also showed evidence of tumor extension within close proximity to multiple skill base foramina, along with evidence of right hypoglossal nerve involvement contributing to right hemitongue denervation changes, and tumor thrombus within the encased right internal jugular vein. MRI otherwise showed no evidence of intracranial perineural tumor spread.    07/27/2022 Initial Biopsy   FNA of the right neck mass showed carcinoma with squamous features, favoring squamous cell carcinoma (however, other tumors with squamous differentiation cannot entirely be excluded); P16 diffusely positive; HPV focally positive.    08/02/2022 PET scan   Whole body PET scan: the dominant right level IIa  metastatic nodal mass measuring 4.9 x 2.5 cm with an SUV max of 15; multiple ipsilateral nodal metastases involving the right level 3, level 4, and level 5 lymph node stations including the dominant right level 3 nodal metastasis (measuring 1.1 cm) coming in contact with the right oropharynx without discrete primary tumor; FDG avidity within the left palatine tonsil (normal in size and with a max SUV of 4.4); and mildly FDG avid mediastinal, bihilar, and subcarinal lymph nodes. No other sites concerning for metastatic disease were appreciated elsewhere in the body.    08/10/2022 Cancer Staging   Staging form: Pharynx - HPV-Mediated Oropharynx, AJCC 8th Edition - Clinical stage from 08/10/2022: Stage I (cT0, cN1, cM0, p16+) - Signed by Izell Domino, MD on 08/11/2022 Stage prefix: Initial diagnosis   08/21/2022 Procedure   Port placement and G-tube placement   08/23/2022 - 10/08/2022 Radiation Therapy   Concurrent radiation   08/24/2022 - 10/05/2022 Chemotherapy   Patient is on Treatment Plan : HEAD/NECK Cisplatin  (40) q7d       CURRENT THERAPY: chemoradiation  INTERVAL HISTORY:  Alara Daniel 68 y.o. female returns for follow up.   Discussed the use of AI scribe software for clinical note transcription with the patient, who gave verbal consent to proceed.  History of Present Illness Meggen Spaziani is a 68 year old female who presents for follow-up regarding her cholesterol levels and renal function.  She has concerns about her cholesterol levels, with recent blood work showing a total cholesterol of 270 mg/dL and an LDL cholesterol of 170 mg/dL. She is interested in managing her cholesterol through dietary changes. Her LDL cholesterol has been consistently high over the years.  She has a  history of head and neck cancer and is currently in remission. She feels well and is pleased with her current health status.  She mentions a recent fall but does not elaborate on any injuries or ongoing issues  related to it.  She discusses her dental health, noting significant bone loss in her jaw as shown by a recent dental x-ray. She is planning to have a bone density scan in October to assess her bone health further.  She is due for a mammogram, with her last one being in October of the previous year.  Her thyroid  function is monitored due to the risk of iatrogenic hypothyroidism from past radiation treatment. Her thyroid  levels were last checked in February, and she plans to have them checked every six months.  No issues with bowel movements or urination. Reports partial tongue dysfunction post-treatment.  Rest of the pertinent 10 point ROS reviewed and neg.  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  MEDICAL HISTORY: Past Medical History:  Diagnosis Date   Allergy    GERD (gastroesophageal reflux disease)    Melanoma (HCC) 12/25/2022   right posterior shoulder ([facility])   Orthostatic syncope 12/26/2022   Osteopenia 01/04/2020   Plantar fasciitis    Seasonal allergies    Syncope and collapse 12/21/2022    SURGICAL HISTORY: Past Surgical History:  Procedure Laterality Date   COLONOSCOPY  2011   IR GASTROSTOMY TUBE MOD SED  08/21/2022   IR GASTROSTOMY TUBE REMOVAL  03/05/2023   IR IMAGING GUIDED PORT INSERTION  08/21/2022   IR PATIENT EVAL TECH 0-60 MINS  08/31/2022   IR PATIENT EVAL TECH 0-60 MINS  09/07/2022   IR REMOVAL TUN ACCESS W/ PORT W/O FL MOD SED  03/05/2023   MOUTH SURGERY  01/2020   implant inserted   TUBAL LIGATION  1992    SOCIAL HISTORY: Social  History   Socioeconomic History   Marital status: Married    Spouse name: Not on file   Number of children: Not on file   Years of education: Not on file   Highest education level: Some college, no degree  Occupational History   Not on file  Tobacco Use   Smoking status: Former    Current packs/day: 0.00    Average packs/day: 1 pack/day for 24.0 years (24.0 ttl pk-yrs)    Types: Cigarettes    Start date: 10/10/1972    Quit date: 10/10/1996    Years since quitting: 27.2   Smokeless tobacco: Never  Vaping Use   Vaping status: Never Used  Substance and Sexual Activity   Alcohol use: Yes    Alcohol/week: 0.0 standard drinks of alcohol    Comment: 1 shot vodka daily   Drug use: No   Sexual activity: Yes    Partners: Male  Other Topics Concern   Not on file  Social History Narrative   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    Lives with husband   1 daughter in [place], 69 grandchildren   Son died   2 grown stepchildren- both Designer, multimedia, grandchildren   Enjoys Corporate investment banker   Social  Drivers of Health   Financial Resource Strain: Low Risk  (12/25/2023)   Overall Financial Resource Strain (CARDIA)    Difficulty of Paying Living Expenses: Not hard at all  Food Insecurity: No Food Insecurity (12/25/2023)   Hunger Vital Sign    Worried About Running Out of Food in the Last Year: Never true    Ran Out of Food in the Last Year: Never true  Transportation Needs: No Transportation Needs (12/25/2023)   PRAPARE - Administrator, Civil Service (Medical): No    Lack of Transportation (Non-Medical): No  Physical Activity: Sufficiently Active (12/25/2023)   Exercise Vital Sign    Days of Exercise per Week: 3 days    Minutes of Exercise per Session: 60 min  Stress: No Stress Concern Present (12/25/2023)   [employer] of Occupational Health - Occupational Stress Questionnaire    Feeling of Stress: Not at all  Social Connections: Moderately Integrated (12/25/2023)    Social Connection and Isolation Panel    Frequency of Communication with Friends and Family: Twice a week    Frequency of Social Gatherings with Friends and Family: Three times a week    Attends Religious Services: 1 to 4 times per year    Active Member of Clubs or Organizations: No    Attends Banker Meetings: Not on file    Marital Status: Married  Catering manager Violence: Not At Risk (08/09/2022)   Humiliation, Afraid, Rape, and Kick questionnaire    Fear of Current or Ex-Partner: No    Emotionally Abused: No    Physically Abused: No    Sexually Abused: No    FAMILY HISTORY: Family History  Problem Relation Age of Onset   Diabetes Mother    Heart disease Mother        chf   Hypertension Mother    Cervical cancer Mother    Obesity Mother    Colon polyps Mother    Thyroid  cancer Father    Cancer Father        thyroid  x2   Heart attack Father    Breast cancer Sister    Lupus Sister    Obesity Maternal Grandmother    Diabetes Maternal Grandmother    Heart disease Maternal Grandmother    Hypertension Maternal Grandmother    Stroke Maternal Grandmother    Diabetes Mellitus II Paternal Grandmother        died from diabetic coma   Cancer Paternal Grandfather        not sure what type   Heart attack Son    Esophageal cancer Neg Hx    Stomach cancer Neg Hx    Rectal cancer Neg Hx    Colon cancer Neg Hx    PHYSICAL EXAMINATION  BP (!) 146/88 (BP Location: Right Arm, Patient Position: Sitting)   Pulse 74   Temp 98 F (36.7 C) (Temporal)   Resp 16   Wt 117 lb (53.1 kg)   SpO2 100%   BMI 22.11 kg/m    Physical Exam Constitutional:      General: She is not in acute distress.    Appearance: Normal appearance. She is not ill-appearing or toxic-appearing.  HENT:     Head: Normocephalic and atraumatic.     Mouth/Throat:     Mouth: Mucous membranes are moist.     Pharynx: Oropharynx is clear. No oropharyngeal exudate or posterior oropharyngeal erythema.   Eyes:     General: No scleral icterus. Cardiovascular:  Rate and Rhythm: Normal rate and regular rhythm.     Pulses: Normal pulses.     Heart sounds: Normal heart sounds.  Pulmonary:     Effort: Pulmonary effort is normal.     Breath sounds: Normal breath sounds.  Abdominal:     General: Abdomen is flat. Bowel sounds are normal. There is no distension.     Palpations: Abdomen is soft.     Tenderness: There is no abdominal tenderness.  Musculoskeletal:        General: No swelling.     Cervical back: Neck supple.  Lymphadenopathy:     Cervical: No cervical adenopathy.  Skin:    General: Skin is warm and dry.     Findings: No rash.  Neurological:     General: No focal deficit present.     Mental Status: She is alert.  Psychiatric:        Mood and Affect: Mood normal.        Behavior: Behavior normal.     LABORATORY DATA:  CBC    Component Value Date/Time   WBC 4.2 01/01/2024 1249   RBC 4.68 01/01/2024 1249   HGB 14.1 01/01/2024 1249   HGB 10.1 (L) 10/31/2022 0806   HCT 41.4 01/01/2024 1249   PLT 326.0 01/01/2024 1249   PLT 426 (H) 10/31/2022 0806   MCV 88.4 01/01/2024 1249   MCH 32.5 10/31/2022 0806   MCHC 34.0 01/01/2024 1249   RDW 13.1 01/01/2024 1249   LYMPHSABS 1.0 01/01/2024 1249   MONOABS 0.3 01/01/2024 1249   EOSABS 0.1 01/01/2024 1249   BASOSABS 0.0 01/01/2024 1249    CMP     Component Value Date/Time   NA 140 01/01/2024 1249   K 4.5 01/01/2024 1249   CL 99 01/01/2024 1249   CO2 33 (H) 01/01/2024 1249   GLUCOSE 92 01/01/2024 1249   BUN 19 01/01/2024 1249   CREATININE 1.07 01/01/2024 1249   CREATININE 1.04 (H) 05/31/2023 1349   CREATININE 1.02 (H) 12/28/2019 0749   CALCIUM 10.2 01/01/2024 1249   PROT 7.0 01/01/2024 1249   ALBUMIN 4.6 01/01/2024 1249   AST 11 01/01/2024 1249   AST 6 (L) 10/31/2022 0806   ALT 18 01/01/2024 1249   ALT 9 10/31/2022 0806   ALKPHOS 74 01/01/2024 1249   BILITOT 0.5 01/01/2024 1249   BILITOT 0.3 10/31/2022  0806   GFRNONAA 60 (L) 05/31/2023 1349     ASSESSMENT and THERAPY PLAN:   This is a very pleasant 68 year old female patient with no significant past medical history referred to medical oncology for squamous cell carcinoma, unknown primary, HPV positive for consideration of concurrent chemoradiation.  She completed concurrent chemoradiation.  Assessment and Plan Assessment & Plan Squamous cell carcinoma of the head and neck, in remission In remission with no evidence of recurrence on recent CT scans. Recurrence likelihood decreases significantly after five years.  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 - Recommend physical therapy to strengthen the tongue and improve function. - Contact speech therapist Lupita via MyChart for tongue strengthening exercises.  Risk for iatrogenic hypothyroidism  Risk due to prior radiation treatment. - Order TSH test today. - Instruct primary care provider to monitor TSH every six months.  Hyperlipidemia with elevated LDL cholesterol LDL cholesterol elevated at 170 mg/dL. Medication necessary due to high LDL - Discuss with PCP about further management of hyperlipidemia.  General Health Maintenance Due for mammogram and bone density scan  in October.   All questions were answered. The patient knows to call the clinic with any problems, questions or concerns. We can certainly see the patient much sooner if necessary.  Total encounter time:30 minutes*in face-to-face visit time, chart review, lab review, care coordination, order entry, and documentation of the encounter time.  *Total Encounter Time as defined by the Centers for Medicare and Medicaid Services includes, in addition to the face-to-face time of a patient visit (documented in the note above) non-face-to-face time: obtaining and reviewing outside history, ordering and reviewing medications, tests or procedures, care coordination  (communications with other health care professionals or caregivers) and documentation in the medical record.

## 2024-01-03 ENCOUNTER — Ambulatory Visit: Payer: Self-pay | Admitting: Hematology and Oncology

## 2024-01-03 ENCOUNTER — Ambulatory Visit: Payer: Self-pay | Admitting: Family

## 2024-01-03 DIAGNOSIS — Z923 Personal history of irradiation: Secondary | ICD-10-CM

## 2024-01-03 NOTE — Telephone Encounter (Signed)
 Please advise pt that her cholesterol is very high. It has actually risen since the last time it was checked which is surprising given her weight loss.  I would recommend that she add atorvastatin once daily to help lower her cholesterol and decrease her risk of heart attack/stroke.

## 2024-01-06 ENCOUNTER — Encounter: Payer: Self-pay | Admitting: Hematology and Oncology

## 2024-01-06 MED ORDER — ATORVASTATIN CALCIUM 20 MG PO TABS: 20.0000 mg | ORAL_TABLET | Freq: Every day | ORAL | 1 refills | Status: AC

## 2024-01-06 NOTE — Telephone Encounter (Signed)
-----   Message from Nurse Leita B sent at 01/06/2024 12:25 PM EDT ----- Erskin Setter,  I just spoke with Ms Perritt about her TSH result. She was advised to call your office per Dr Loretha to schedule a 6 month TSH r/c. Dr Loretha recommends monitoring d/t the possibility of iatrogenic  hypothyroidism secondary to her radiation treatments.   Thanks, Leita, LPN ----- Message ----- From: Loretha Ash, MD Sent: 01/03/2024   7:19 PM EDT To: Leita KANDICE Daring, LPN  TSH mildly elevated. I would recommend she get a repeat in 6 months either here or at her PCP. Since we wont be seeing her for a yr, better to do it at her PCP. Once you discuss with her, please let  me know what she would like to do.  Thanks ----- Message ----- From: Rebecka, [facility] [place] Sent: 01/02/2024   6:12 PM EDT To: Ash Loretha, MD

## 2024-01-06 NOTE — Addendum Note (Signed)
 Addended by: DARYL SETTER on: 01/06/2024 01:53 PM   Modules accepted: Orders

## 2024-01-06 NOTE — Progress Notes (Signed)
Patient notified of results and provider's recommendations.  

## 2024-01-06 NOTE — Telephone Encounter (Signed)
 Please schedule pt a 6 month lab visit for TSH.

## 2024-01-07 ENCOUNTER — Encounter: Payer: Self-pay | Admitting: Family

## 2024-01-07 ENCOUNTER — Telehealth: Payer: Self-pay | Admitting: Family

## 2024-01-07 DIAGNOSIS — E039 Hypothyroidism, unspecified: Secondary | ICD-10-CM

## 2024-01-07 NOTE — Telephone Encounter (Signed)
 Patient taking b12, skin, hair and nais supplements. Per pcp this can alter results.  Recheck tsh, t3 and free t4 in one month. Patient will get 02/12/24 when she comes in for AWV

## 2024-01-07 NOTE — Telephone Encounter (Signed)
 Thyroid  test done at Oncology is borderline low. We should check some additional hormone studies to see how much thyroid  hormone she is making and this will let us  know if we should put her on medicine. Let's bring her back to do this soon.

## 2024-01-08 NOTE — Telephone Encounter (Signed)
 Pt has scheduled AWV for 02/12/24 at 11am, in person visit

## 2024-02-11 DIAGNOSIS — Z923 Personal history of irradiation: Secondary | ICD-10-CM | POA: Diagnosis not present

## 2024-02-11 DIAGNOSIS — Z85818 Personal history of malignant neoplasm of other sites of lip, oral cavity, and pharynx: Secondary | ICD-10-CM | POA: Diagnosis not present

## 2024-02-11 DIAGNOSIS — H9193 Unspecified hearing loss, bilateral: Secondary | ICD-10-CM | POA: Diagnosis not present

## 2024-02-12 ENCOUNTER — Other Ambulatory Visit

## 2024-02-12 ENCOUNTER — Ambulatory Visit: Admitting: *Deleted

## 2024-02-12 VITALS — BP 132/77 | HR 69 | Temp 97.8°F | Resp 18 | Ht 61.0 in | Wt 121.8 lb

## 2024-02-12 DIAGNOSIS — E039 Hypothyroidism, unspecified: Secondary | ICD-10-CM

## 2024-02-12 DIAGNOSIS — C770 Secondary and unspecified malignant neoplasm of lymph nodes of head, face and neck: Secondary | ICD-10-CM

## 2024-02-12 DIAGNOSIS — Z923 Personal history of irradiation: Secondary | ICD-10-CM

## 2024-02-12 DIAGNOSIS — Z0000 Encounter for general adult medical examination without abnormal findings: Secondary | ICD-10-CM

## 2024-02-12 DIAGNOSIS — Z780 Asymptomatic menopausal state: Secondary | ICD-10-CM

## 2024-02-12 LAB — T3, FREE: T3, Free: 3 pg/mL (ref 2.3–4.2)

## 2024-02-12 LAB — T4, FREE: Free T4: 0.67 ng/dL (ref 0.60–1.60)

## 2024-02-12 LAB — TSH: TSH: 5.62 u[IU]/mL — ABNORMAL HIGH (ref 0.35–5.50)

## 2024-02-12 NOTE — Progress Notes (Signed)
 Subjective:   Nichole Dominguez is a 68 y.o. female who presents for a Medicare Annual Wellness Visit.  Allergies (verified) Amlodipine , Darvon [propoxyphene], and Sudafed [pseudoephedrine hcl]   History: Past Medical History:  Diagnosis Date   Allergy    GERD (gastroesophageal reflux disease)    Melanoma (HCC) 12/25/2022   right posterior shoulder ([facility])   Orthostatic syncope 12/26/2022   Osteopenia 01/04/2020   Plantar fasciitis    Seasonal allergies    Syncope and collapse 12/21/2022   Past Surgical History:  Procedure Laterality Date   COLONOSCOPY  2011   IR GASTROSTOMY TUBE MOD SED  08/21/2022   IR GASTROSTOMY TUBE REMOVAL  03/05/2023   IR IMAGING GUIDED PORT INSERTION  08/21/2022   IR PATIENT EVAL TECH 0-60 MINS  08/31/2022   IR PATIENT EVAL TECH 0-60 MINS  09/07/2022   IR REMOVAL TUN ACCESS W/ PORT W/O FL MOD SED  03/05/2023   MOUTH SURGERY  01/2020   implant inserted   TUBAL LIGATION  1992   Family History  Problem Relation Age of Onset   Diabetes Mother    Heart disease Mother        chf   Hypertension Mother    Cervical cancer Mother    Obesity Mother    Colon polyps Mother    Thyroid  cancer Father    Cancer Father        thyroid  x2   Heart attack Father    Breast cancer Sister    Lupus Sister    Obesity Maternal Grandmother    Diabetes Maternal Grandmother    Heart disease Maternal Grandmother    Hypertension Maternal Grandmother    Stroke Maternal Grandmother    Diabetes Mellitus II Paternal Grandmother        died from diabetic coma   Cancer Paternal Grandfather        not sure what type   Heart attack Son    Esophageal cancer Neg Hx    Stomach cancer Neg Hx    Rectal cancer Neg Hx    Colon cancer Neg Hx    Social History   Occupational History   Not on file  Tobacco Use   Smoking status: Former    Current packs/day: 0.00    Average packs/day: 1 pack/day for 24.0 years (24.0 ttl pk-yrs)    Types: Cigarettes    Start date:  10/10/1972    Quit date: 10/10/1996    Years since quitting: 27.3   Smokeless tobacco: Never  Vaping Use   Vaping status: Never Used  Substance and Sexual Activity   Alcohol use: Not Currently   Drug use: No   Sexual activity: Yes    Partners: Male   Tobacco Counseling Counseling given: Not Answered  SDOH Screenings   Food Insecurity: No Food Insecurity (02/05/2024)  Housing: Low Risk  (02/05/2024)  Transportation Needs: No Transportation Needs (02/05/2024)  Utilities: Not At Risk (02/12/2024)  Depression (PHQ2-9): Low Risk  (02/12/2024)  Financial Resource Strain: Low Risk  (02/05/2024)  Physical Activity: Sufficiently Active (02/05/2024)  Social Connections: Moderately Integrated (02/05/2024)  Stress: No Stress Concern Present (02/05/2024)  Tobacco Use: Medium Risk (02/12/2024)  Health Literacy: Adequate Health Literacy (02/12/2024)   Depression Screen    02/12/2024   11:12 AM 01/01/2024   11:46 AM 12/19/2022   11:17 AM 08/09/2022   11:12 AM 10/17/2021    1:37 PM 12/02/2017   12:00 PM 11/19/2016    5:25 PM  PHQ 2/9 Scores  PHQ - 2 Score 0 0 0 0 0 0 0  PHQ- 9 Score 0 0 0   0 0     Goals Addressed             This Visit's Progress    to restart sewing         Visit info / Clinical Intake: Medicare Wellness Visit Type:: Initial Annual Wellness Visit Medicare Wellness Visit Mode:: In-person (required for WTM) Interpreter Needed?: No Pre-visit prep was completed: yes AWV questionnaire completed by patient prior to visit?: yes Date:: 02/05/24 Living arrangements:: lives with spouse/significant other Patient's Overall Health Status Rating: good Typical amount of pain: none Does pain affect daily life?: no Are you currently prescribed opioids?: no  Dietary Habits and Nutritional Risks How many meals a day?: (!) 1 (sometimes eats 2) Eats fruit and vegetables daily?: yes (no fruit) Most meals are obtained by: preparing own meals Diabetic:: no  Functional  Status Activities of Daily Living (to include ambulation/medication): Independent Ambulation: Independent Medication Administration: Independent Home Management: Independent Manage your own finances?: yes Primary transportation is: driving Concerns about vision?: no *vision screening is required for WTM* Concerns about hearing?: no  Fall Screening Falls in the past year?: 0 Number of falls in past year: 0 Was there an injury with Fall?: 0 Fall Risk Category Calculator: 0 Patient Fall Risk Level: Low Fall Risk  Fall Risk Patient at Risk for Falls Due to: No Fall Risks Fall risk Follow up: Falls evaluation completed  Home and Transportation Safety: All rugs have non-skid backing?: yes All stairs or steps have railings?: yes Grab bars in the bathtub or shower?: (!) no Have non-skid surface in bathtub or shower?: yes Good home lighting?: yes Regular seat belt use?: yes Hospital stays in the last year:: no  Cognitive Assessment Difficulty concentrating, remembering, or making decisions? : no Will 6CIT or Mini Cog be Completed: yes What year is it?: 0 points What month is it?: 0 points Give patient an address phrase to remember (5 components): [address] [place]  About what time is it?: 0 points Count backwards from 20 to 1: 0 points Say the months of the year in reverse: 0 points Repeat the address phrase from earlier: 0 points 6 CIT Score: 0 points  Advance Directives (For Healthcare) Does Patient Have a Medical Advance Directive?: Yes Does patient want to make changes to medical advance directive?: No - Patient declined Type of Advance Directive: Healthcare Power of Attorney Copy of Healthcare Power of Attorney in Chart?: Yes - validated most recent copy scanned in chart (See row information)  Reviewed/Updated  Reviewed/Updated: All        Objective:    Today's Vitals   02/12/24 1055  BP: 132/77  Pulse: 69  Resp: 18  Temp: 97.8 F (36.6 C)   TempSrc: Oral  SpO2: 95%  Weight: 121 lb 12.8 oz (55.2 kg)  Height: 5' 1 (1.549 m)   Body mass index is 23.01 kg/m.  Current Medications (verified) Outpatient Encounter Medications as of 02/12/2024  Medication Sig   ascorbic acid (VITAMIN C) 500 MG tablet    atorvastatin (LIPITOR) 20 MG tablet Take 1 tablet (20 mg total) by mouth daily.   Calcium Carbonate (CALCIUM 600 HIGH POTENCY PO) Take 3 capsules by mouth daily.   cholecalciferol (VITAMIN D3) 25 MCG (1000 UNIT) tablet Take 1,000 Units by mouth daily.   fluticasone  (FLONASE ) 50 MCG/ACT nasal spray Place 2 sprays into both nostrils daily.  Magnesium  500 MG CAPS Take 1 capsule (500 mg total) by mouth daily.   Multiple Vitamins-Minerals (MULTIVITAMIN WITH MINERALS) tablet Take 1 tablet by mouth daily.   omeprazole  (PRILOSEC) 20 MG capsule TAKE 1 CAPSULE EVERY MORNING   Zinc 50 MG TABS    No facility-administered encounter medications on file as of 02/12/2024.   Hearing/Vision screen Hearing Screening - Comments:: Has decreased hearing since cancer treatment. Has hearing test on Monday Vision Screening - Comments:: Up to date with routine eye exams with [facility] / brian harris Immunizations and Health Maintenance Health Maintenance  Topic Date Due   Mammogram  01/30/2024   COVID-19 Vaccine (7 - Pfizer risk 2025-26 season) 06/30/2024   DEXA SCAN  01/29/2025   Medicare Annual Wellness (AWV)  02/11/2025   Colonoscopy  03/29/2027   DTaP/Tdap/Td (3 - Td or Tdap) 12/22/2032   Pneumococcal Vaccine: 50+ Years  Completed   Influenza Vaccine  Completed   Hepatitis C Screening  Completed   Zoster Vaccines- Shingrix   Completed   Meningococcal B Vaccine  Aged Out        Assessment/Plan:  This is a routine wellness examination for P[place]lie.  Patient Care Team: Daryl Setter, NP as PCP - General (Internal Medicine) Malmfelt, Delon CROME, RN as Oncology Nurse Navigator Izell Domino, MD as Consulting Physician  (Radiation Oncology) Loretha Ash, MD as Consulting Physician (Hematology and Oncology) Murry Francyne Schuller, MD as Referring Physician (Otolaryngology) Llewellyn Gerard LABOR, DO as Consulting Physician (Otolaryngology)  I have personally reviewed and noted the following in the patient's chart:   Medical and social history Use of alcohol, tobacco or illicit drugs  Current medications and supplements including opioid prescriptions. Functional ability and status Nutritional status Physical activity Advanced directives List of other physicians Hospitalizations, surgeries, and ER visits in previous 12 months Vitals Screenings to include cognitive, depression, and falls Referrals and appointments  No orders of the defined types were placed in this encounter.  In addition, I have reviewed and discussed with patient certain preventive protocols, quality metrics, and best practice recommendations. A written personalized care plan for preventive services as well as general preventive health recommendations were provided to patient.   Lolita Libra, CMA   02/12/2024   Return in 1 year (on 02/11/2025).  After Visit Summary: (In Person-Printed) AVS printed and given to the patient  Nurse Notes: nothing significant to report

## 2024-02-12 NOTE — Addendum Note (Signed)
 Addended by: ESTELLE GILLIS D on: 02/12/2024 10:45 AM   Modules accepted: Orders

## 2024-02-12 NOTE — Patient Instructions (Addendum)
 Nichole Dominguez,  Thank you for taking the time for your Medicare Wellness Visit. I appreciate your continued commitment to your health goals. Please review the care plan we discussed, and feel free to reach out if I can assist you further.  Please note that Annual Wellness Visits do not include a physical exam. Some assessments may be limited, especially if the visit was conducted virtually. If needed, we may recommend an in-person follow-up with your provider.  Ongoing Care Seeing your primary care provider every 3 to 6 months helps us  monitor your health and provide consistent, personalized care.   Nichole Ponto, NP: 05/26/24 11:20am Annual Wellness Visit: 02/16/25  Referrals If a referral was made during today's visit and you haven't received any updates within two weeks, please contact the referred provider directly to check on the status.  Mammogram:  please complete as scheduled  Recommended Screenings:  Health Maintenance  Topic Date Due   Medicare Annual Wellness Visit  Never done   Breast Cancer Screening  01/30/2024   COVID-19 Vaccine (7 - Pfizer risk 2025-26 season) 06/30/2024   DEXA scan (bone density measurement)  01/29/2025   Colon Cancer Screening  03/29/2027   DTaP/Tdap/Td vaccine (3 - Td or Tdap) 12/22/2032   Pneumococcal Vaccine for age over 6  Completed   Flu Shot  Completed   Hepatitis C Screening  Completed   Zoster (Shingles) Vaccine  Completed   Meningitis B Vaccine  Aged Out       02/12/2024   11:03 AM  Advanced Directives  Does Patient Have a Medical Advance Directive? Yes  Type of Advance Directive Healthcare Power of Attorney  Does patient want to make changes to medical advance directive? No - Patient declined  Copy of Healthcare Power of Attorney in Chart? Yes - validated most recent copy scanned in chart (See row information)    Vision: Annual vision screenings are recommended for early detection of glaucoma, cataracts, and diabetic  retinopathy. These exams can also reveal signs of chronic conditions such as diabetes and high blood pressure.  Dental: Annual dental screenings help detect early signs of oral cancer, gum disease, and other conditions linked to overall health, including heart disease and diabetes.  Please see the attached documents for additional preventive care recommendations.

## 2024-02-13 ENCOUNTER — Ambulatory Visit (HOSPITAL_BASED_OUTPATIENT_CLINIC_OR_DEPARTMENT_OTHER)

## 2024-02-13 ENCOUNTER — Telehealth: Payer: Self-pay | Admitting: Family

## 2024-02-13 DIAGNOSIS — E039 Hypothyroidism, unspecified: Secondary | ICD-10-CM

## 2024-02-13 MED ORDER — LEVOTHYROXINE SODIUM 25 MCG PO TABS: 25.0000 ug | ORAL_TABLET | Freq: Every day | ORAL | 0 refills | Status: DC

## 2024-02-13 NOTE — Telephone Encounter (Signed)
 Please add synthroid 25 mcg, repeat TSH in 6 weeks.  Lab work is showing that her thyroid  is not making enough hormone.

## 2024-02-14 NOTE — Telephone Encounter (Signed)
 Patient notified of results and new medication

## 2024-02-17 DIAGNOSIS — H903 Sensorineural hearing loss, bilateral: Secondary | ICD-10-CM | POA: Diagnosis not present

## 2024-02-18 ENCOUNTER — Other Ambulatory Visit (HOSPITAL_BASED_OUTPATIENT_CLINIC_OR_DEPARTMENT_OTHER)

## 2024-02-18 ENCOUNTER — Ambulatory Visit (HOSPITAL_BASED_OUTPATIENT_CLINIC_OR_DEPARTMENT_OTHER)
Admission: RE | Admit: 2024-02-18 | Discharge: 2024-02-18 | Disposition: A | Discharge status: Home / Self Care | Source: Ambulatory Visit | Attending: Family | Admitting: Family

## 2024-02-18 ENCOUNTER — Encounter (HOSPITAL_BASED_OUTPATIENT_CLINIC_OR_DEPARTMENT_OTHER): Payer: Self-pay

## 2024-02-18 DIAGNOSIS — Z1231 Encounter for screening mammogram for malignant neoplasm of breast: Secondary | ICD-10-CM | POA: Insufficient documentation

## 2024-02-18 DIAGNOSIS — M8580 Other specified disorders of bone density and structure, unspecified site: Secondary | ICD-10-CM

## 2024-02-18 DIAGNOSIS — H5203 Hypermetropia, bilateral: Secondary | ICD-10-CM | POA: Diagnosis not present

## 2024-02-20 ENCOUNTER — Encounter: Payer: Self-pay | Admitting: Family

## 2024-03-10 DIAGNOSIS — K080 Exfoliation of teeth due to systemic causes: Secondary | ICD-10-CM | POA: Diagnosis not present

## 2024-03-26 ENCOUNTER — Other Ambulatory Visit

## 2024-03-26 DIAGNOSIS — E039 Hypothyroidism, unspecified: Secondary | ICD-10-CM

## 2024-03-26 LAB — TSH: TSH: 4.79 u[IU]/mL (ref 0.35–5.50)

## 2024-03-27 ENCOUNTER — Ambulatory Visit: Payer: Self-pay | Admitting: Family

## 2024-04-16 ENCOUNTER — Encounter: Payer: Self-pay | Admitting: Family

## 2024-04-17 ENCOUNTER — Other Ambulatory Visit: Payer: Self-pay

## 2024-04-17 DIAGNOSIS — E039 Hypothyroidism, unspecified: Secondary | ICD-10-CM

## 2024-04-17 MED ORDER — LEVOTHYROXINE SODIUM 25 MCG PO TABS: 25.0000 ug | ORAL_TABLET | Freq: Every day | ORAL | 1 refills | Status: AC

## 2024-04-17 NOTE — Telephone Encounter (Signed)
 Refill sent

## 2024-05-26 ENCOUNTER — Ambulatory Visit: Admitting: Family

## 2024-06-02 ENCOUNTER — Ambulatory Visit: Payer: Medicare Other | Admitting: Radiology

## 2024-06-02 ENCOUNTER — Ambulatory Visit: Admitting: Radiation Oncology

## 2025-01-05 ENCOUNTER — Ambulatory Visit: Admitting: Hematology and Oncology
